# Patient Record
Sex: Female | Born: 1955 | Race: Asian | Hispanic: No | Marital: Married | Smoking: Never smoker
Health system: Southern US, Community
[De-identification: ages and names within clinical notes are randomized; demographics above are authoritative.]

## PROBLEM LIST (undated history)

## (undated) DIAGNOSIS — I10 Essential (primary) hypertension: Secondary | ICD-10-CM

## (undated) DIAGNOSIS — N189 Chronic kidney disease, unspecified: Secondary | ICD-10-CM

## (undated) DIAGNOSIS — E1165 Type 2 diabetes mellitus with hyperglycemia: Secondary | ICD-10-CM

## (undated) DIAGNOSIS — F0390 Unspecified dementia without behavioral disturbance: Secondary | ICD-10-CM

---
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
## 2013-08-18 DIAGNOSIS — I10 Essential (primary) hypertension: Secondary | ICD-10-CM

## 2013-08-18 DIAGNOSIS — IMO0002 Reserved for concepts with insufficient information to code with codable children: Secondary | ICD-10-CM

## 2013-08-18 DIAGNOSIS — E1165 Type 2 diabetes mellitus with hyperglycemia: Secondary | ICD-10-CM | POA: Insufficient documentation

## 2013-08-18 DIAGNOSIS — E119 Type 2 diabetes mellitus without complications: Secondary | ICD-10-CM | POA: Insufficient documentation

## 2013-08-18 HISTORY — DX: Type 2 diabetes mellitus with hyperglycemia: E11.65

## 2013-08-18 HISTORY — DX: Reserved for concepts with insufficient information to code with codable children: IMO0002

## 2013-08-18 HISTORY — DX: Essential (primary) hypertension: I10

## 2018-10-19 ENCOUNTER — Telehealth: Payer: Self-pay

## 2018-10-19 NOTE — Telephone Encounter (Signed)
Patient will need to schedule NP appointment

## 2019-03-28 ENCOUNTER — Other Ambulatory Visit: Payer: Self-pay

## 2019-03-28 ENCOUNTER — Ambulatory Visit: Payer: Self-pay | Admitting: Internal Medicine

## 2019-03-28 ENCOUNTER — Encounter: Payer: Self-pay | Admitting: Internal Medicine

## 2019-03-28 VITALS — BP 160/90 | HR 74 | Resp 12 | Ht 61.0 in | Wt 150.0 lb

## 2019-03-28 DIAGNOSIS — G309 Alzheimer's disease, unspecified: Secondary | ICD-10-CM

## 2019-03-28 DIAGNOSIS — I10 Essential (primary) hypertension: Secondary | ICD-10-CM

## 2019-03-28 DIAGNOSIS — F4312 Post-traumatic stress disorder, chronic: Secondary | ICD-10-CM

## 2019-03-28 DIAGNOSIS — F0280 Dementia in other diseases classified elsewhere without behavioral disturbance: Secondary | ICD-10-CM

## 2019-03-28 DIAGNOSIS — E1165 Type 2 diabetes mellitus with hyperglycemia: Secondary | ICD-10-CM

## 2019-03-28 DIAGNOSIS — F0390 Unspecified dementia without behavioral disturbance: Secondary | ICD-10-CM

## 2019-03-28 HISTORY — DX: Unspecified dementia, unspecified severity, without behavioral disturbance, psychotic disturbance, mood disturbance, and anxiety: F03.90

## 2019-03-28 MED ORDER — MELOXICAM 7.5 MG PO TABS: 7.5000 mg | ORAL_TABLET | Freq: Every day | ORAL | 6 refills | Status: DC

## 2019-03-28 MED ORDER — AMLODIPINE BESYLATE 5 MG PO TABS: 5.0000 mg | ORAL_TABLET | Freq: Every day | ORAL | 11 refills | Status: DC

## 2019-03-28 MED ORDER — METFORMIN HCL 1000 MG PO TABS: 1000.0000 mg | ORAL_TABLET | Freq: Two times a day (BID) | ORAL | 11 refills | Status: DC

## 2019-03-28 MED ORDER — GLIMEPIRIDE 1 MG PO TABS: 1.0000 mg | ORAL_TABLET | Freq: Every day | ORAL | 11 refills | Status: DC

## 2019-03-28 MED ORDER — METOPROLOL TARTRATE 100 MG PO TABS: 100.0000 mg | ORAL_TABLET | Freq: Two times a day (BID) | ORAL | 11 refills | Status: DC

## 2019-03-28 NOTE — Progress Notes (Signed)
Subjective:    Patient ID: Jennifer Molina, female   DOB: 1955-10-14, 63 y.o.   MRN: [id number]   HPI   Here to establish Her daughter, Tryphena Perkovich, interprets.  Speaks Rhade, [place]. Here also for evaluation of cognitive abilities for [phone].  Previously seen at [facility] by Raelyn Number , PA.  Very expensive as has to pay out of pocket as no medical insurance.  Current Health Issues:  1.  Memory Issues:  History of trauma in past during the [place] War.  Her two brothers stepped on a landmine, which exploded resulting in mortal injuries.  She observed their injuries when he was brought to the hospital.  She was 63 yo at the time.  Her brothers were 71 yo and 63 yo at the time of injuries and deaths.   She admits to bad memories of seeing her brothers and their injuries.  She develops physical symptoms of neck and terrible headache pain.   This keeps her from doing her ADLs due to the pain.   Generally suffers with this 3-4 times weekly.   Daughter notes these episodes, where her mother gets very quiet, appears sad and needs to go lie down.  Her mother describes getting sweaty with her heart beating fast with these episodes. She often isolates herself, is sad and hypervigilant.  Can be jumpy/startles easily.   She tries to enjoy her grandchildren, but her daughter states she even has difficulty being happy playing with them. The loss of her brothers also led to significant pressure on her as the eldest child in the family and no female leader in the family. Married to [place] soldier at age 65 yo.  Her husband was jailed for 5 years when they were in [place].   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  She also was in charge of caring for her 3 younger sisters at the time as well.  She essentially functioned as the head of the family as her parents were  aging at the time and could not work to bring in much income.   Came to the [place] with her husband and children on Dec.  17th, 2009. Her life remained hard when her husband was released from jail.  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   [place] a better life.    2.  Memory loss:  Daughter states she has had difficulties remembering simple chores, such as watering outdoor plants, but this has become worse in past 5 years per daughter.   Difficulty learning to use modern conveniences in the [place]   Turning on and off appliances and lights was difficult for her to learn when arrived in 2009. She went to ESOL classes from 2009 through 2013 and was unable to retain little of the information. Over the past 5 years, daughter notes she forgets what she was planning to purchase at the store.   She gets lost when out of the home.  Daughter states this happens frequently.  She has been on her way to visit a neighbor and gets lost and cannot find her way to the neighbor's home or her own.  She also cannot remember any phone numbers to get in touch with family  when she is lost. She leaves the stove on frequently and someone has to come behind her to turn it off.   Daughter states she has to remind her to take showers, to eat.  She can perform these tasks if reminded to do so. She has gone without eating at times with these reminders. Her short term memory is poor.  Daughter states she has to tell her the same information repeatedly over short time period as she does not retain.   No definite focal neurologic deficit.    The family has had to limit what she does due to her loss of memory.   3.  DM:  Diagnosed March 2015.  Went without treatment much of the time due to expense of doctor's visits and medication.  She also did not have and understanding of needing medication chronically--took for 2 months and then stopped  taking as she thought she was okay. Taking Metformin 1000 mg twice daily from March 2020 with establishing with [facility]  and Glimepiride 1 mg starting about 3 weeks ago.  Her daughter states she has to remind her with each dose to take her medications as she forgets.  Having a hard time paying for meds--costs about $500 per month.    Poor vision, but no eye exam done ever. No numbness of hands or feet. Not clear if high cholesterol. Not clear if any renal compromise.   4.  Hypertension:  Diagnosed also 2015 with same problem with medications as in DM.  Remains uncontrolled.  She is reportedly takes daily with her daughter reminding her, but did not take this morning.  Takes Lopressor twice daily and Amlodipine 5 mg added 3 weeks ago.       Current Meds  Medication Sig  . amLODipine (NORVASC) 5 MG tablet Take 5 mg by mouth daily.   . ASPIRIN LOW DOSE 81 MG EC tablet Take 81 mg by mouth daily.   Marland Kitchen glimepiride (AMARYL) 1 MG tablet Take 1 mg by mouth daily with breakfast.   . meloxicam (MOBIC) 7.5 MG tablet Take 7.5 mg by mouth daily.   . metFORMIN (GLUCOPHAGE) 1000 MG tablet Take 1,000 mg by mouth 2 (two) times daily with a meal.   . metoprolol tartrate (LOPRESSOR) 100 MG tablet Take 100 mg by mouth 2 (two) times daily.    No Known Allergies   Past Medical History:  Diagnosis Date  . DM (diabetes mellitus), type 2, uncontrolled ([place]) 2015   Has poor vision.  Family not aware of A1C or what sugars run.  Believe not well controlled.  Not able to monitor due to cost of monitoring equipment  . Hypertension 2015    History reviewed. No pertinent surgical history.  Social History   Socioeconomic History  . Marital status: Married    Spouse name: [name]  . Number of children: 7  . Years of education: 3  . Highest education level: 3rd grade  Occupational History  . Occupation: Retired from farming in [place]  Social Needs  . Financial resource strain: Not on file  . Food  insecurity    Worry: Not on file    Inability: Not on file  . Transportation needs    Medical: Not on file    Non-medical: Not on file  Tobacco Use  . Smoking status: Never Smoker  . Smokeless tobacco: Never Used  Substance and Sexual Activity  . Alcohol use: Never    Frequency: Never  . Drug use: Never  .  Sexual activity: Not on file  Lifestyle  . Physical activity    Days per week: Not on file    Minutes per session: Not on file  . Stress: Not on file  Relationships  . Social Herbalist on phone: Not on file    Gets together: Not on file    Attends religious service: Not on file    Active member of club or organization: Not on file    Attends meetings of clubs or organizations: Not on file    Relationship status: Not on file  . Intimate partner violence    Fear of current or ex partner: Not on file    Emotionally abused: Not on file    Physically abused: Not on file    Forced sexual activity: Not on file  Other Topics Concern  . Not on file  Social History Narrative   [place]   Lives at home with husband   Her 2 daughters, 2 sons and their families   All together, 63 people in the home        Review of Systems    Objective:   BP (!) 160/90 (BP Location: Left Arm, Patient Position: Sitting, Cuff Size: Normal)   Pulse 74   Resp 12   Ht 5\' 1"  (1.549 m)   Wt 150 lb (68 kg)   LMP  (LMP Unknown)   BMI 28.34 kg/m   Physical Exam   NAD, though holds head and neck toward end of MoCA when appears stressed with answering questions and cannot answer. HEENT:  PERRL, EOMI, discs sharp, TMs pearly gray, throat without injection. Neck:  Supple, No adenopathy, no thyromegaly Chest:  CTA CV:  RRR with normal S1 and S2, No S3, S4 or murmur.  No carotid bruits.  Carotid, Radial, Femoral, DP and PT pulses normal and equal Abd:  S, NT, No HSM or mass, + BS LE:  No edema Neuro:  Alert.  Oriented to person, day of week, month, year, but not day of  month.  Not oriented to place CN  II-XII grossly intact.  Motor 5/5 throughout.  DTRs 2+/4 throughout.  Gait normal. During paperwork, noted that daughter had to talk her through signing and dating her paperwork today.  Montreal Cognitive Assessment  03/28/2019  Visuospatial/ Executive (0/5) 1  Naming (0/3) 1  Attention: Read list of digits (0/2) 0  Attention: Read list of letters (0/1) 1  Attention: Serial 7 subtraction starting at 100 (0/3) 0  Language: Repeat phrase (0/2) 1  Language : Fluency (0/1) 0  Abstraction (0/2) 0  Delayed Recall (0/5) 0  Orientation (0/6) 4  Total 8  Adjusted Score (based on education) 9     Assessment & Plan  1.  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 Her long term memory appears to be fairly intact, however, recalling incidents from her young adulthood in [place].   Discuss medication at follow up. Form [id number]   2.  PTSD with history of exposure to war, witnessing the aftermath of her brothers' violent deaths, extreme stress as a young women with her husband jailed and her new role as head of her extended family household including breadwinner during a time of war. Continues to suffer symptoms related  to these memories. Scored 38/80 on PTSD Checklist, supporting this diagnosis as well. Referral to Adelene Amas, LCSWA for counseling.  3. DM:  A1C, FLP, urine microalbumin today.   Glimepiride, Metformin refilled at [facility] where less expensive.  To apply for [facility] orange card so can obtain monitoring equipment more affordably. Will need eye check once has orange card Send for records from [facility]-suspect need Pneumococcal 23 v immunization.  4.  Hypertension:  Did not take meds this  morning.  Not clear if current regimen adequately controls bp. Refilled Amlodipine and Metoprolol. Urine microalbumin/crea to see if should be taking ACE I.   Recheck in 6 weeks.

## 2019-03-30 LAB — TSH: TSH: 3.08 u[IU]/mL (ref 0.450–4.500)

## 2019-03-30 LAB — COMPREHENSIVE METABOLIC PANEL
ALT: 12 IU/L (ref 0–32)
AST: 15 IU/L (ref 0–40)
Albumin/Globulin Ratio: 1.1 — ABNORMAL LOW (ref 1.2–2.2)
Albumin: 4.6 g/dL (ref 3.8–4.8)
Alkaline Phosphatase: 54 IU/L (ref 39–117)
BUN/Creatinine Ratio: 16 (ref 12–28)
BUN: 14 mg/dL (ref 8–27)
Bilirubin Total: <0.2 mg/dL (ref 0.0–1.2)
CO2: 20 mmol/L (ref 20–29)
Calcium: 9.7 mg/dL (ref 8.7–10.3)
Chloride: 101 mmol/L (ref 96–106)
Creatinine, Ser: 0.9 mg/dL (ref 0.57–1.00)
GFR calc Af Amer: 78 mL/min/{1.73_m2} (ref >59)
GFR calc non Af Amer: 68 mL/min/{1.73_m2} (ref >59)
Globulin, Total: 4.1 g/dL (ref 1.5–4.5)
Glucose: 203 mg/dL — ABNORMAL HIGH (ref 65–99)
Potassium: 4.7 mmol/L (ref 3.5–5.2)
Sodium: 140 mmol/L (ref 134–144)
Total Protein: 8.7 g/dL — ABNORMAL HIGH (ref 6.0–8.5)

## 2019-03-30 LAB — LIPID PANEL W/O CHOL/HDL RATIO
Cholesterol, Total: 222 mg/dL — ABNORMAL HIGH (ref 100–199)
HDL: 40 mg/dL (ref >39)
LDL Calculated: 104 mg/dL — ABNORMAL HIGH (ref 0–99)
Triglycerides: 389 mg/dL — ABNORMAL HIGH (ref 0–149)
VLDL Cholesterol Cal: 78 mg/dL — ABNORMAL HIGH (ref 5–40)

## 2019-03-30 LAB — CBC WITH DIFFERENTIAL/PLATELET
Basophils Absolute: 0.1 10*3/uL (ref 0.0–0.2)
Basos: 1 %
EOS (ABSOLUTE): 0.1 10*3/uL (ref 0.0–0.4)
Eos: 2 %
Hematocrit: 37.4 % (ref 34.0–46.6)
Hemoglobin: 12.2 g/dL (ref 11.1–15.9)
Immature Grans (Abs): 0 10*3/uL (ref 0.0–0.1)
Immature Granulocytes: 1 %
Lymphocytes Absolute: 2.5 10*3/uL (ref 0.7–3.1)
Lymphs: 31 %
MCH: 27 pg (ref 26.6–33.0)
MCHC: 32.6 g/dL (ref 31.5–35.7)
MCV: 83 fL (ref 79–97)
Monocytes Absolute: 0.5 10*3/uL (ref 0.1–0.9)
Monocytes: 6 %
Neutrophils Absolute: 5.1 10*3/uL (ref 1.4–7.0)
Neutrophils: 59 %
Platelets: 295 10*3/uL (ref 150–450)
RBC: 4.52 x10E6/uL (ref 3.77–5.28)
RDW: 14.4 % (ref 11.7–15.4)
WBC: 8.3 10*3/uL (ref 3.4–10.8)

## 2019-03-30 LAB — HGB A1C W/O EAG: Hgb A1c MFr Bld: 8.3 % — ABNORMAL HIGH (ref 4.8–5.6)

## 2019-03-30 LAB — MICROALBUMIN / CREATININE URINE RATIO
Creatinine, Urine: 37.6 mg/dL
Microalb/Creat Ratio: 65 mg/g creat — ABNORMAL HIGH (ref 0–29)
Microalbumin, Urine: 24.6 ug/mL

## 2019-05-10 ENCOUNTER — Ambulatory Visit: Payer: Self-pay | Admitting: Internal Medicine

## 2019-07-21 ENCOUNTER — Other Ambulatory Visit: Payer: Self-pay

## 2019-07-21 ENCOUNTER — Emergency Department (HOSPITAL_COMMUNITY): Payer: Medicaid Other

## 2019-07-21 ENCOUNTER — Encounter (HOSPITAL_COMMUNITY): Payer: Self-pay | Admitting: Emergency Medicine

## 2019-07-21 ENCOUNTER — Inpatient Hospital Stay (HOSPITAL_COMMUNITY)
Admission: EM | Admit: 2019-07-21 | Discharge: 2019-07-29 | DRG: 871 | Disposition: A | Discharge status: Home / Self Care | Payer: Medicaid Other | Attending: Internal Medicine | Admitting: Internal Medicine

## 2019-07-21 DIAGNOSIS — R197 Diarrhea, unspecified: Secondary | ICD-10-CM | POA: Diagnosis present

## 2019-07-21 DIAGNOSIS — U071 COVID-19: Secondary | ICD-10-CM

## 2019-07-21 DIAGNOSIS — H547 Unspecified visual loss: Secondary | ICD-10-CM | POA: Diagnosis present

## 2019-07-21 DIAGNOSIS — J1289 Other viral pneumonia: Secondary | ICD-10-CM | POA: Diagnosis present

## 2019-07-21 DIAGNOSIS — I10 Essential (primary) hypertension: Secondary | ICD-10-CM | POA: Diagnosis present

## 2019-07-21 DIAGNOSIS — Z7984 Long term (current) use of oral hypoglycemic drugs: Secondary | ICD-10-CM

## 2019-07-21 DIAGNOSIS — D649 Anemia, unspecified: Secondary | ICD-10-CM | POA: Diagnosis present

## 2019-07-21 DIAGNOSIS — F0390 Unspecified dementia without behavioral disturbance: Secondary | ICD-10-CM | POA: Diagnosis present

## 2019-07-21 DIAGNOSIS — IMO0002 Reserved for concepts with insufficient information to code with codable children: Secondary | ICD-10-CM | POA: Diagnosis present

## 2019-07-21 DIAGNOSIS — Z79899 Other long term (current) drug therapy: Secondary | ICD-10-CM

## 2019-07-21 DIAGNOSIS — J9601 Acute respiratory failure with hypoxia: Secondary | ICD-10-CM | POA: Diagnosis present

## 2019-07-21 DIAGNOSIS — E1165 Type 2 diabetes mellitus with hyperglycemia: Secondary | ICD-10-CM | POA: Diagnosis present

## 2019-07-21 DIAGNOSIS — Z7982 Long term (current) use of aspirin: Secondary | ICD-10-CM

## 2019-07-21 DIAGNOSIS — E871 Hypo-osmolality and hyponatremia: Secondary | ICD-10-CM | POA: Diagnosis present

## 2019-07-21 DIAGNOSIS — A4189 Other specified sepsis: Principal | ICD-10-CM | POA: Diagnosis present

## 2019-07-21 DIAGNOSIS — J1282 Pneumonia due to coronavirus disease 2019: Secondary | ICD-10-CM

## 2019-07-21 DIAGNOSIS — E860 Dehydration: Secondary | ICD-10-CM | POA: Diagnosis present

## 2019-07-21 LAB — CBC
HCT: 32.2 % — ABNORMAL LOW (ref 36.0–46.0)
Hemoglobin: 10.6 g/dL — ABNORMAL LOW (ref 12.0–15.0)
MCH: 25.9 pg — ABNORMAL LOW (ref 26.0–34.0)
MCHC: 32.9 g/dL (ref 30.0–36.0)
MCV: 78.7 fL — ABNORMAL LOW (ref 80.0–100.0)
Platelets: 197 10*3/uL (ref 150–400)
RBC: 4.09 MIL/uL (ref 3.87–5.11)
RDW: 13 % (ref 11.5–15.5)
WBC: 6 10*3/uL (ref 4.0–10.5)
nRBC: 0 % (ref 0.0–0.2)

## 2019-07-21 LAB — COMPREHENSIVE METABOLIC PANEL
ALT: 38 U/L (ref 0–44)
AST: 59 U/L — ABNORMAL HIGH (ref 15–41)
Albumin: 2.9 g/dL — ABNORMAL LOW (ref 3.5–5.0)
Alkaline Phosphatase: 36 U/L — ABNORMAL LOW (ref 38–126)
Anion gap: 10 (ref 5–15)
BUN: 11 mg/dL (ref 8–23)
CO2: 22 mmol/L (ref 22–32)
Calcium: 8.5 mg/dL — ABNORMAL LOW (ref 8.9–10.3)
Chloride: 98 mmol/L (ref 98–111)
Creatinine, Ser: 0.94 mg/dL (ref 0.44–1.00)
GFR calc Af Amer: >60 mL/min (ref >60)
GFR calc non Af Amer: >60 mL/min (ref >60)
Glucose, Bld: 311 mg/dL — ABNORMAL HIGH (ref 70–99)
Potassium: 3.6 mmol/L (ref 3.5–5.1)
Sodium: 130 mmol/L — ABNORMAL LOW (ref 135–145)
Total Bilirubin: 0.5 mg/dL (ref 0.3–1.2)
Total Protein: 7.5 g/dL (ref 6.5–8.1)

## 2019-07-21 LAB — LACTIC ACID, PLASMA: Lactic Acid, Venous: 1.9 mmol/L (ref 0.5–1.9)

## 2019-07-21 LAB — LIPASE, BLOOD: Lipase: 34 U/L (ref 11–51)

## 2019-07-21 MED ORDER — SODIUM CHLORIDE 0.9 % IV BOLUS
1000.0000 mL | Freq: Once | INTRAVENOUS | Status: AC
  Administered 2019-07-22: 1000 mL via INTRAVENOUS

## 2019-07-21 MED ORDER — ACETAMINOPHEN 325 MG PO TABS
650.0000 mg | ORAL_TABLET | Freq: Once | ORAL | Status: AC
  Administered 2019-07-21: 19:00:00 650 mg via ORAL
  Filled 2019-07-21: qty 2

## 2019-07-21 MED ORDER — SODIUM CHLORIDE 0.9% FLUSH
3.0000 mL | Freq: Once | INTRAVENOUS | Status: AC
  Administered 2019-07-22: 3 mL via INTRAVENOUS

## 2019-07-21 NOTE — ED Triage Notes (Signed)
Daughter/translator stated, shes been sick since Saturday with N/V/D and a fever.

## 2019-07-21 NOTE — ED Provider Notes (Signed)
[facility] EMERGENCY DEPARTMENT Provider Note   CSN: [id number] Arrival date & time: 07/21/19  [place]     History   Chief Complaint Chief Complaint  Patient presents with  . Nausea  . Emesis  . Diarrhea  . Fever    HPI Jennifer Molina is a 63 y.o. female.     HPI  Attempted status interpreter but difficulty 2/2 to dialect. Patient's daughter at bedside provided interpretation for the encounter:  Pt is a 63 year old female with PMH of DMII, HTN, dementia who presents to the ED with fever, N/V/D. Pt has had fever for 5 days. Has been taking tylenol and ibuprofen (400mg  total daily). Yesterday, she began having nausea, NBNB  Emesis and diarrhea. She denies abdominal pain. No dysuria. Denies headache. Does endorse some chest discomfort intermittently. Many members of her family have had similar symptoms.   Past Medical History:  Diagnosis Date  . Dementia ([place]) 03/28/2019  . DM (diabetes mellitus), type 2, uncontrolled ([place]) 2015   Has poor vision.  Family not aware of A1C or what sugars run.  Believe not well controlled.  Not able to monitor due to cost of monitoring equipment  . Hypertension 2015    Patient Active Problem List   Diagnosis Date Noted  . SIRS (systemic inflammatory response syndrome) ([place]) 07/22/2019  . Pneumonia due to COVID-19 virus 07/22/2019  . Dementia ([place]) 03/28/2019  . Chronic post-traumatic stress disorder (PTSD) 03/28/2019  . DM (diabetes mellitus), type 2, uncontrolled ([place]) 2015  . Hypertension 2015    History reviewed. No pertinent surgical history.   OB History   No obstetric history on file.      Home Medications    Prior to Admission medications   Medication Sig Start Date End Date Taking? Authorizing Provider  acetaminophen (TYLENOL) 500 MG tablet Take 1,000 mg by mouth every 6 (six) hours as needed for mild pain or fever.   Yes [provider]  amLODipine (NORVASC) 5 MG tablet Take 1 tablet (5 mg total) by  mouth daily. 03/28/19   Mack Hook, MD  ASPIRIN LOW DOSE 81 MG EC tablet Take 81 mg by mouth daily.  03/17/19   [provider]  glimepiride (AMARYL) 1 MG tablet Take 1 tablet (1 mg total) by mouth daily with breakfast. 03/28/19   Mack Hook, MD  meloxicam (MOBIC) 7.5 MG tablet Take 1 tablet (7.5 mg total) by mouth daily. 03/28/19   Mack Hook, MD  metFORMIN (GLUCOPHAGE) 1000 MG tablet Take 1 tablet (1,000 mg total) by mouth 2 (two) times daily with a meal. 03/28/19   Mack Hook, MD  metoprolol tartrate (LOPRESSOR) 100 MG tablet Take 1 tablet (100 mg total) by mouth 2 (two) times daily. 03/28/19   Mack Hook, MD    Family History Family History  Problem Relation Age of Onset  . Diabetes Mellitus II Neg Hx     Social History Social History   Tobacco Use  . Smoking status: Never Smoker  . Smokeless tobacco: Never Used  Substance Use Topics  . Alcohol use: Never    Frequency: Never  . Drug use: Never     Allergies   Patient has no known allergies.   Review of Systems Review of Systems  Constitutional: Positive for appetite change, chills and fever.  HENT: Positive for sore throat. Negative for congestion.   Respiratory: Positive for cough, chest tightness and shortness of breath.   Cardiovascular: Negative for leg swelling.  Gastrointestinal: Positive  for diarrhea, nausea and vomiting. Negative for abdominal pain and blood in stool.  Genitourinary: Negative for dysuria.  Musculoskeletal: Negative for back pain.  Skin: Negative for rash.  Neurological: Negative for headaches.     Physical Exam Updated Vital Signs BP (!) 152/81 (BP Location: Right Arm)   Pulse 85   Temp 99.8 F (37.7 C) (Oral)   Resp (!) 32   Ht 5\' 1"  (1.549 m)   Wt 68 kg   LMP  (LMP Unknown)   SpO2 91%   BMI 28.33 kg/m   Physical Exam Vitals signs and nursing note reviewed.  Constitutional:      General: She is not in acute distress.     Appearance: She is well-developed.  HENT:     Head: Normocephalic and atraumatic.     Mouth/Throat:     Mouth: Mucous membranes are dry.  Eyes:     Extraocular Movements: Extraocular movements intact.     Conjunctiva/sclera: Conjunctivae normal.     Pupils: Pupils are equal, round, and reactive to light.  Neck:     Musculoskeletal: Normal range of motion and neck supple. No neck rigidity.  Cardiovascular:     Rate and Rhythm: Regular rhythm. Tachycardia present.     Heart sounds: No murmur.  Pulmonary:     Effort: Pulmonary effort is normal. No respiratory distress.     Breath sounds: Normal breath sounds.  Abdominal:     General: There is no distension.     Palpations: Abdomen is soft. There is no mass.     Tenderness: There is no abdominal tenderness.  Musculoskeletal: Normal range of motion.  Skin:    General: Skin is warm and dry.  Neurological:     General: No focal deficit present.     Mental Status: She is alert. Mental status is at baseline.     Sensory: No sensory deficit.     Motor: No weakness.  Psychiatric:        Mood and Affect: Mood normal.        Behavior: Behavior normal.      ED Treatments / Results  Labs (all labs ordered are listed, but only abnormal results are displayed) Labs Reviewed  COMPREHENSIVE METABOLIC PANEL - Abnormal; Notable for the following components:      Result Value   Sodium 130 (*)    Glucose, Bld 311 (*)    Calcium 8.5 (*)    Albumin 2.9 (*)    AST 59 (*)    Alkaline Phosphatase 36 (*)    All other components within normal limits  CBC - Abnormal; Notable for the following components:   Hemoglobin 10.6 (*)    HCT 32.2 (*)    MCV 78.7 (*)    MCH 25.9 (*)    All other components within normal limits  COMPREHENSIVE METABOLIC PANEL - Abnormal; Notable for the following components:   Sodium 129 (*)    CO2 20 (*)    Glucose, Bld 296 (*)    Calcium 8.1 (*)    Albumin 2.7 (*)    AST 52 (*)    Alkaline Phosphatase 37 (*)     All other components within normal limits  C-REACTIVE PROTEIN - Abnormal; Notable for the following components:   CRP 6.7 (*)    All other components within normal limits  CBC WITH DIFFERENTIAL/PLATELET - Abnormal; Notable for the following components:   Hemoglobin 10.1 (*)    HCT 31.0 (*)    MCV 78.1 (*)  MCH 25.4 (*)    All other components within normal limits  FERRITIN - Abnormal; Notable for the following components:   Ferritin 1,690 (*)    All other components within normal limits  GLUCOSE, CAPILLARY - Abnormal; Notable for the following components:   Glucose-Capillary 263 (*)    All other components within normal limits  POC SARS CORONAVIRUS 2 AG -  ED - Abnormal; Notable for the following components:   SARS Coronavirus 2 Ag POSITIVE (*)    All other components within normal limits  CBG MONITORING, ED - Abnormal; Notable for the following components:   Glucose-Capillary 395 (*)    All other components within normal limits  LIPASE, BLOOD  LACTIC ACID, PLASMA  HIV ANTIBODY (ROUTINE TESTING W REFLEX)  URINALYSIS, ROUTINE W REFLEX MICROSCOPIC  CBC  LACTATE DEHYDROGENASE  FIBRINOGEN  FERRITIN  PROCALCITONIN  C-REACTIVE PROTEIN  SEDIMENTATION RATE  D-DIMER, QUANTITATIVE (NOT AT [facility])  ABO/RH  TROPONIN I (HIGH SENSITIVITY)  TROPONIN I (HIGH SENSITIVITY)    EKG EKG Interpretation  Date/Time:  Thursday July 21 2019 18:45:07 EST Ventricular Rate:  119 PR Interval:  156 QRS Duration: 66 QT Interval:  322 QTC Calculation: 452 R Axis:   81 Text Interpretation: Sinus tachycardia Otherwise normal ECG No previous ECGs available Confirmed by Ripley Fraise [id number]) on 07/22/2019 12:03:46 AM   Radiology Dg Chest Portable 1 View  Result Date: 07/21/2019 CLINICAL DATA:  63 year old female with fever and cough for several days. COVID-19 test pending. EXAM: PORTABLE CHEST 1 VIEW COMPARISON:  None. FINDINGS: Portable AP semi upright view at 2326 hours. Multifocal patchy  and confluent mostly peripheral bilateral pulmonary opacity sparing the lung apices. Superimposed low lung volumes. Mediastinal contours remain normal. No pneumothorax, pulmonary edema or pleural effusion. Visualized tracheal air column is within normal limits. Negative visible bowel gas pattern. No acute osseous abnormality identified. IMPRESSION: Patchy and confluent bilateral peripheral mid and lower lung opacity, most compatible with bilateral pneumonia in this clinical setting. This is a pattern frequently seen with COVID-19. Electronically Signed   By: Genevie Ann M.D.   On: 07/21/2019 23:36    Procedures Procedures (including critical care time)  Medications Ordered in ED Medications  aspirin EC tablet 81 mg (81 mg Oral Given 07/22/19 0955)  amLODipine (NORVASC) tablet 5 mg (5 mg Oral Given 07/22/19 0955)  metoprolol tartrate (LOPRESSOR) tablet 100 mg (100 mg Oral Given 07/22/19 0955)  acetaminophen (TYLENOL) tablet 650 mg (has no administration in time range)    Or  acetaminophen (TYLENOL) suppository 650 mg (has no administration in time range)  ondansetron (ZOFRAN) tablet 4 mg (has no administration in time range)    Or  ondansetron (ZOFRAN) injection 4 mg (has no administration in time range)  enoxaparin (LOVENOX) injection 40 mg (40 mg Subcutaneous Given 07/22/19 0955)  dexamethasone (DECADRON) injection 6 mg (has no administration in time range)  remdesivir 200 mg in sodium chloride 0.9% 250 mL IVPB (0 mg Intravenous Stopped 07/22/19 0657)    Followed by  remdesivir 100 mg in sodium chloride 0.9 % 100 mL IVPB (has no administration in time range)  insulin aspart (novoLOG) injection 0-15 Units (8 Units Subcutaneous Given 07/22/19 1246)  insulin detemir (LEVEMIR) injection 10 Units (has no administration in time range)  sodium chloride flush (NS) 0.9 % injection 3 mL (3 mLs Intravenous Given 07/22/19 0007)  acetaminophen (TYLENOL) tablet 650 mg (650 mg Oral Given 07/21/19 1911)  sodium  chloride 0.9 % bolus 1,000 mL (0 mLs  Intravenous Stopped 07/22/19 0135)  dexamethasone (DECADRON) injection 6 mg (6 mg Intravenous Given 07/22/19 0201)     Initial Impression / Assessment and Plan / ED Course  I have reviewed the triage vital signs and the nursing notes.  Pertinent labs & imaging results that were available during my care of the patient were reviewed by me and considered in my medical decision making (see chart for details).     Jennifer Molina was evaluated in Emergency Department on 07/22/2019 for the symptoms described in the history of present illness. She was evaluated in the context of the global COVID-19 pandemic, which necessitated consideration that the patient might be at risk for infection with the SARS-CoV-2 virus that causes COVID-19. Institutional protocols and algorithms that pertain to the evaluation of patients at risk for COVID-19 are in a state of rapid change based on information released by regulatory bodies including the CDC and federal and state organizations. These policies and algorithms were followed during the patient's care in the ED.   On arrival, pt is febrile, mildly tachycardia. At bedside, pt placed on RA and had desaturation to 87%, was placed on 2L [place] with improvement.   Considered: Viral URI/gastroenteritis, Covid 19, PNA, influenza; Abdomen is soft and non tender, no focal pain to suggest appendicitis, SBO, mesenteric ischemia (LA non elevated as well). Lipase non elevated  Hgb 10.6 from 12.2 3 months ago. Denies blood in stool or emesis.   EKG: Sinus tachycardia CXR: Bilateral PNA, likely viral   Covid 19 +  Overall, concern for viral syndrome in setting of covid 19 infection and PNA. Given hypoxia, plan for patient to be admitted to medicine. Care of patient assumed by Dr. Christy Gentles at handoff.   Final Clinical Impressions(s) / ED Diagnoses   Final diagnoses:  Pneumonia due to COVID-19 virus    ED Discharge Orders    None        Burns Spain, MD 07/22/19 1313    Ripley Fraise, MD 07/22/19 2306

## 2019-07-21 NOTE — ED Triage Notes (Signed)
Last dose of Tylenol was 1000 this morning.

## 2019-07-22 ENCOUNTER — Encounter (HOSPITAL_COMMUNITY): Payer: Self-pay | Admitting: Internal Medicine

## 2019-07-22 ENCOUNTER — Other Ambulatory Visit: Payer: Self-pay

## 2019-07-22 DIAGNOSIS — E1165 Type 2 diabetes mellitus with hyperglycemia: Secondary | ICD-10-CM

## 2019-07-22 DIAGNOSIS — Z7982 Long term (current) use of aspirin: Secondary | ICD-10-CM | POA: Diagnosis not present

## 2019-07-22 DIAGNOSIS — R197 Diarrhea, unspecified: Secondary | ICD-10-CM | POA: Diagnosis present

## 2019-07-22 DIAGNOSIS — U071 COVID-19: Secondary | ICD-10-CM | POA: Diagnosis present

## 2019-07-22 DIAGNOSIS — H547 Unspecified visual loss: Secondary | ICD-10-CM | POA: Diagnosis present

## 2019-07-22 DIAGNOSIS — I10 Essential (primary) hypertension: Secondary | ICD-10-CM | POA: Diagnosis present

## 2019-07-22 DIAGNOSIS — F0390 Unspecified dementia without behavioral disturbance: Secondary | ICD-10-CM | POA: Diagnosis present

## 2019-07-22 DIAGNOSIS — Z7984 Long term (current) use of oral hypoglycemic drugs: Secondary | ICD-10-CM | POA: Diagnosis not present

## 2019-07-22 DIAGNOSIS — Z79899 Other long term (current) drug therapy: Secondary | ICD-10-CM | POA: Diagnosis not present

## 2019-07-22 DIAGNOSIS — D649 Anemia, unspecified: Secondary | ICD-10-CM | POA: Diagnosis present

## 2019-07-22 DIAGNOSIS — E860 Dehydration: Secondary | ICD-10-CM | POA: Diagnosis present

## 2019-07-22 DIAGNOSIS — J1289 Other viral pneumonia: Secondary | ICD-10-CM | POA: Diagnosis present

## 2019-07-22 DIAGNOSIS — A4189 Other specified sepsis: Secondary | ICD-10-CM | POA: Diagnosis present

## 2019-07-22 DIAGNOSIS — J9601 Acute respiratory failure with hypoxia: Secondary | ICD-10-CM | POA: Diagnosis present

## 2019-07-22 DIAGNOSIS — E871 Hypo-osmolality and hyponatremia: Secondary | ICD-10-CM | POA: Diagnosis present

## 2019-07-22 DIAGNOSIS — J1282 Pneumonia due to coronavirus disease 2019: Secondary | ICD-10-CM | POA: Diagnosis present

## 2019-07-22 LAB — CBC WITH DIFFERENTIAL/PLATELET
Abs Immature Granulocytes: 0.03 10*3/uL (ref 0.00–0.07)
Basophils Absolute: 0 10*3/uL (ref 0.0–0.1)
Basophils Relative: 0 %
Eosinophils Absolute: 0 10*3/uL (ref 0.0–0.5)
Eosinophils Relative: 0 %
HCT: 31 % — ABNORMAL LOW (ref 36.0–46.0)
Hemoglobin: 10.1 g/dL — ABNORMAL LOW (ref 12.0–15.0)
Immature Granulocytes: 1 %
Lymphocytes Relative: 19 %
Lymphs Abs: 1 10*3/uL (ref 0.7–4.0)
MCH: 25.4 pg — ABNORMAL LOW (ref 26.0–34.0)
MCHC: 32.6 g/dL (ref 30.0–36.0)
MCV: 78.1 fL — ABNORMAL LOW (ref 80.0–100.0)
Monocytes Absolute: 0.1 10*3/uL (ref 0.1–1.0)
Monocytes Relative: 2 %
Neutro Abs: 4 10*3/uL (ref 1.7–7.7)
Neutrophils Relative %: 78 %
Platelets: 214 10*3/uL (ref 150–400)
RBC: 3.97 MIL/uL (ref 3.87–5.11)
RDW: 12.9 % (ref 11.5–15.5)
WBC: 5.1 10*3/uL (ref 4.0–10.5)
nRBC: 0 % (ref 0.0–0.2)

## 2019-07-22 LAB — COMPREHENSIVE METABOLIC PANEL
ALT: 34 U/L (ref 0–44)
AST: 52 U/L — ABNORMAL HIGH (ref 15–41)
Albumin: 2.7 g/dL — ABNORMAL LOW (ref 3.5–5.0)
Alkaline Phosphatase: 37 U/L — ABNORMAL LOW (ref 38–126)
Anion gap: 10 (ref 5–15)
BUN: 11 mg/dL (ref 8–23)
CO2: 20 mmol/L — ABNORMAL LOW (ref 22–32)
Calcium: 8.1 mg/dL — ABNORMAL LOW (ref 8.9–10.3)
Chloride: 99 mmol/L (ref 98–111)
Creatinine, Ser: 0.81 mg/dL (ref 0.44–1.00)
GFR calc Af Amer: >60 mL/min (ref >60)
GFR calc non Af Amer: >60 mL/min (ref >60)
Glucose, Bld: 296 mg/dL — ABNORMAL HIGH (ref 70–99)
Potassium: 4.2 mmol/L (ref 3.5–5.1)
Sodium: 129 mmol/L — ABNORMAL LOW (ref 135–145)
Total Bilirubin: 0.6 mg/dL (ref 0.3–1.2)
Total Protein: 7.3 g/dL (ref 6.5–8.1)

## 2019-07-22 LAB — HEMOGLOBIN A1C
Hgb A1c MFr Bld: 8.9 % — ABNORMAL HIGH (ref 4.8–5.6)
Mean Plasma Glucose: 208.73 mg/dL

## 2019-07-22 LAB — GLUCOSE, CAPILLARY
Glucose-Capillary: 263 mg/dL — ABNORMAL HIGH (ref 70–99)
Glucose-Capillary: 125 mg/dL — ABNORMAL HIGH (ref 70–99)
Glucose-Capillary: 293 mg/dL — ABNORMAL HIGH (ref 70–99)

## 2019-07-22 LAB — TROPONIN I (HIGH SENSITIVITY)
Troponin I (High Sensitivity): 9 ng/L (ref <18)
Troponin I (High Sensitivity): 12 ng/L (ref <18)

## 2019-07-22 LAB — ABO/RH: ABO/RH(D): AB POS

## 2019-07-22 LAB — FERRITIN: Ferritin: 1690 ng/mL — ABNORMAL HIGH (ref 11–307)

## 2019-07-22 LAB — CBG MONITORING, ED: Glucose-Capillary: 395 mg/dL — ABNORMAL HIGH (ref 70–99)

## 2019-07-22 LAB — POC SARS CORONAVIRUS 2 AG -  ED: SARS Coronavirus 2 Ag: POSITIVE — AB

## 2019-07-22 LAB — C-REACTIVE PROTEIN: CRP: 6.7 mg/dL — ABNORMAL HIGH (ref <1.0)

## 2019-07-22 LAB — HIV ANTIBODY (ROUTINE TESTING W REFLEX): HIV Screen 4th Generation wRfx: NONREACTIVE

## 2019-07-22 MED ORDER — INSULIN ASPART 100 UNIT/ML ~~LOC~~ SOLN: 0.0000 [IU] | Freq: Every day | SUBCUTANEOUS | Status: DC

## 2019-07-22 MED ORDER — INSULIN ASPART 100 UNIT/ML ~~LOC~~ SOLN
0.0000 [IU] | Freq: Every day | SUBCUTANEOUS | Status: DC
  Administered 2019-07-22: 21:00:00 3 [IU] via SUBCUTANEOUS
  Administered 2019-07-23 – 2019-07-25 (×3): 5 [IU] via SUBCUTANEOUS
  Administered 2019-07-26 – 2019-07-27 (×2): 3 [IU] via SUBCUTANEOUS
  Administered 2019-07-28: 5 [IU] via SUBCUTANEOUS

## 2019-07-22 MED ORDER — ASPIRIN EC 81 MG PO TBEC
81.0000 mg | DELAYED_RELEASE_TABLET | Freq: Every day | ORAL | Status: DC
  Administered 2019-07-22 – 2019-07-29 (×8): 81 mg via ORAL
  Filled 2019-07-22 (×8): qty 1

## 2019-07-22 MED ORDER — ACETAMINOPHEN 325 MG PO TABS
650.0000 mg | ORAL_TABLET | Freq: Four times a day (QID) | ORAL | Status: DC | PRN
  Administered 2019-07-24: 650 mg via ORAL
  Filled 2019-07-22: qty 2

## 2019-07-22 MED ORDER — ONDANSETRON HCL 4 MG PO TABS: 4.0000 mg | ORAL_TABLET | Freq: Four times a day (QID) | ORAL | Status: DC | PRN

## 2019-07-22 MED ORDER — AMLODIPINE BESYLATE 5 MG PO TABS
5.0000 mg | ORAL_TABLET | Freq: Every day | ORAL | Status: DC
  Administered 2019-07-22 – 2019-07-29 (×8): 5 mg via ORAL
  Filled 2019-07-22 (×8): qty 1

## 2019-07-22 MED ORDER — DEXAMETHASONE SODIUM PHOSPHATE 10 MG/ML IJ SOLN
6.0000 mg | Freq: Once | INTRAMUSCULAR | Status: AC
  Administered 2019-07-22: 02:00:00 6 mg via INTRAVENOUS
  Filled 2019-07-22: qty 1

## 2019-07-22 MED ORDER — METOPROLOL TARTRATE 25 MG PO TABS
100.0000 mg | ORAL_TABLET | Freq: Two times a day (BID) | ORAL | Status: DC
  Administered 2019-07-22 – 2019-07-29 (×15): 100 mg via ORAL
  Filled 2019-07-22 (×15): qty 4

## 2019-07-22 MED ORDER — INSULIN ASPART 100 UNIT/ML ~~LOC~~ SOLN
0.0000 [IU] | SUBCUTANEOUS | Status: DC
  Administered 2019-07-22: 13:00:00 8 [IU] via SUBCUTANEOUS

## 2019-07-22 MED ORDER — INSULIN ASPART 100 UNIT/ML ~~LOC~~ SOLN
0.0000 [IU] | Freq: Three times a day (TID) | SUBCUTANEOUS | Status: DC
  Administered 2019-07-22: 1 [IU] via SUBCUTANEOUS
  Administered 2019-07-23: 9 [IU] via SUBCUTANEOUS
  Administered 2019-07-23: 2 [IU] via SUBCUTANEOUS
  Administered 2019-07-23: 9 [IU] via SUBCUTANEOUS
  Administered 2019-07-24: 3 [IU] via SUBCUTANEOUS
  Administered 2019-07-24: 9 [IU] via SUBCUTANEOUS
  Administered 2019-07-24: 7 [IU] via SUBCUTANEOUS
  Administered 2019-07-25: 5 [IU] via SUBCUTANEOUS
  Administered 2019-07-25: 2 [IU] via SUBCUTANEOUS
  Administered 2019-07-25: 9 [IU] via SUBCUTANEOUS
  Administered 2019-07-26: 09:00:00 3 [IU] via SUBCUTANEOUS
  Administered 2019-07-26: 17:00:00 9 [IU] via SUBCUTANEOUS
  Administered 2019-07-26: 3 [IU] via SUBCUTANEOUS
  Administered 2019-07-27: 2 [IU] via SUBCUTANEOUS
  Administered 2019-07-27 (×2): 7 [IU] via SUBCUTANEOUS
  Administered 2019-07-28: 3 [IU] via SUBCUTANEOUS
  Administered 2019-07-28: 5 [IU] via SUBCUTANEOUS
  Administered 2019-07-28: 9 [IU] via SUBCUTANEOUS
  Administered 2019-07-29: 13:00:00 3 [IU] via SUBCUTANEOUS
  Administered 2019-07-29: 09:00:00 1 [IU] via SUBCUTANEOUS

## 2019-07-22 MED ORDER — DEXAMETHASONE SODIUM PHOSPHATE 10 MG/ML IJ SOLN: 6.0000 mg | INTRAMUSCULAR | Status: DC

## 2019-07-22 MED ORDER — SODIUM CHLORIDE 0.9 % IV SOLN
100.0000 mg | Freq: Every day | INTRAVENOUS | Status: AC
  Administered 2019-07-23 – 2019-07-26 (×4): 100 mg via INTRAVENOUS
  Filled 2019-07-22 (×6): qty 20

## 2019-07-22 MED ORDER — INSULIN DETEMIR 100 UNIT/ML ~~LOC~~ SOLN
20.0000 [IU] | Freq: Every day | SUBCUTANEOUS | Status: DC
  Administered 2019-07-22: 20 [IU] via SUBCUTANEOUS
  Filled 2019-07-22: qty 0.2

## 2019-07-22 MED ORDER — INSULIN DETEMIR 100 UNIT/ML ~~LOC~~ SOLN
0.1500 [IU]/kg | Freq: Two times a day (BID) | SUBCUTANEOUS | Status: DC
  Filled 2019-07-22 (×3): qty 0.1

## 2019-07-22 MED ORDER — INSULIN ASPART 100 UNIT/ML ~~LOC~~ SOLN
3.0000 [IU] | Freq: Three times a day (TID) | SUBCUTANEOUS | Status: DC
  Administered 2019-07-22 – 2019-07-26 (×12): 3 [IU] via SUBCUTANEOUS

## 2019-07-22 MED ORDER — ONDANSETRON HCL 4 MG/2ML IJ SOLN: 4.0000 mg | Freq: Four times a day (QID) | INTRAMUSCULAR | Status: DC | PRN

## 2019-07-22 MED ORDER — SODIUM CHLORIDE 0.9 % IV SOLN
200.0000 mg | Freq: Once | INTRAVENOUS | Status: AC
  Administered 2019-07-22: 06:00:00 200 mg via INTRAVENOUS
  Filled 2019-07-22: qty 40

## 2019-07-22 MED ORDER — ENOXAPARIN SODIUM 40 MG/0.4ML ~~LOC~~ SOLN
40.0000 mg | SUBCUTANEOUS | Status: DC
  Administered 2019-07-22 – 2019-07-29 (×8): 40 mg via SUBCUTANEOUS
  Filled 2019-07-22 (×8): qty 0.4

## 2019-07-22 MED ORDER — DEXAMETHASONE SODIUM PHOSPHATE 10 MG/ML IJ SOLN
6.0000 mg | Freq: Two times a day (BID) | INTRAMUSCULAR | Status: DC
  Administered 2019-07-22 – 2019-07-29 (×15): 6 mg via INTRAVENOUS
  Filled 2019-07-22 (×15): qty 1

## 2019-07-22 MED ORDER — INSULIN ASPART 100 UNIT/ML ~~LOC~~ SOLN
0.0000 [IU] | Freq: Three times a day (TID) | SUBCUTANEOUS | Status: DC
  Administered 2019-07-22: 15 [IU] via SUBCUTANEOUS

## 2019-07-22 MED ORDER — ACETAMINOPHEN 650 MG RE SUPP: 650.0000 mg | Freq: Four times a day (QID) | RECTAL | Status: DC | PRN

## 2019-07-22 NOTE — ED Notes (Signed)
Notified  Dr,wickline about poc   covid 19 was postive.

## 2019-07-22 NOTE — ED Notes (Signed)
Patient CBG was 395.

## 2019-07-22 NOTE — ED Notes (Signed)
[name] ([facility]) called @ 1110-per RN called by Levada Dy

## 2019-07-22 NOTE — Progress Notes (Signed)
Daughter Lexine Baton updated on patients condition.

## 2019-07-22 NOTE — Progress Notes (Signed)
Inpatient Diabetes Program Recommendations  AACE/ADA: New Consensus Statement on Inpatient Glycemic Control (2015)  Target Ranges:  Prepandial:   less than 140 mg/dL      Peak postprandial:   less than 180 mg/dL (1-2 hours)      Critically ill patients:  140 - 180 mg/dL   Lab Results  Component Value Date   GLUCAP 395 (H) 07/22/2019   HGBA1C 8.3 (H) 03/28/2019    Review of Glycemic Control Results for Jennifer Molina, Jennifer Molina (MRN [id number]) as of 07/22/2019 09:38  Ref. Range 07/22/2019 09:30  Glucose-Capillary Latest Ref Range: 70 - 99 mg/dL 395 (H)   Diabetes history: DM 2 Outpatient Diabetes medications: Amaryl 1 mg Daily, Metformin 1000 mg bid Current orders for Inpatient glycemic control: Novolog 0-15 units tid   Decadron 6 mg Q24 hours BUN/Creat: 11/0.81 No PO supplements at this time  Inpatient Diabetes Program Recommendations:    Noted Novolog Moderate Correction ordered this am. Pt receiving steroids due to COVID.  -  Please utilize the COVID 19 Glycemic Control order set initial SQ therapy option.  -  Consider Levemir 8-10 units bid (recommended by the order set).  -  Add Novolog hs scale  Thanks,  Tama Headings RN, MSN, BC-ADM Inpatient Diabetes Coordinator Team Pager [phone] (8a-5p)

## 2019-07-22 NOTE — H&P (Signed)
History and Physical    Jennifer Molina [id number] DOB: [birth year] DOA: 07/21/2019  PCP: Mack Hook, MD  Patient coming from: Home.  Chief Complaint: Nausea vomiting diarrhea.  [place] Opt[facility]rist used.  HPI: Jennifer Molina is a 63 y.o. female with history of diabetes mellitus, hypertension and dementia per the chart was brought to the ER after patient was not feeling well with flulike symptoms nausea vomiting diarrhea and cough for the last 4 days.  Over the last couple of days patient also has mild shortness of breath.  Denies any chest pain.  Given the symptoms patient presents to the ER.  Patient states her family members are also sick.  ED Course: In the ER patient was febrile with temperature 102 F tachycardic with chest x-ray showing bilateral infiltrates.  COVID-19 test was positive.  Labs showed WBC count of 6 high sensitive troponin of 9 and 12.  Blood glucose 311 AST 59 hemoglobin 10.6 platelets 197.  Lactic acid is 1.9.  Patient started on Decadron remdesivir admitted for acute respiratory failure and SIRS picture secondary to COVID-19.  Review of Systems: As per HPI, rest all negative.   Past Medical History:  Diagnosis Date   Dementia ([place]) 03/28/2019   DM (diabetes mellitus), type 2, uncontrolled ([place]) 2015   Has poor vision.  Family not aware of A1C or what sugars run.  Believe not well controlled.  Not able to monitor due to cost of monitoring equipment   Hypertension 2015    History reviewed. No pertinent surgical history.   reports that she has never smoked. She has never used smokeless tobacco. She reports that she does not drink alcohol or use drugs.  No Known Allergies  Family History  Problem Relation Age of Onset   Diabetes Mellitus II Neg Hx     Prior to Admission medications   Medication Sig Start Date End Date Taking? Authorizing Provider  acetaminophen (TYLENOL) 500 MG tablet Take 1,000 mg by mouth every 6 (six) hours as needed for mild  pain or fever.   Yes [provider]  amLODipine (NORVASC) 5 MG tablet Take 1 tablet (5 mg total) by mouth daily. 03/28/19   Mack Hook, MD  ASPIRIN LOW DOSE 81 MG EC tablet Take 81 mg by mouth daily.  03/17/19   [provider]  glimepiride (AMARYL) 1 MG tablet Take 1 tablet (1 mg total) by mouth daily with breakfast. 03/28/19   Mack Hook, MD  meloxicam (MOBIC) 7.5 MG tablet Take 1 tablet (7.5 mg total) by mouth daily. 03/28/19   Mack Hook, MD  metFORMIN (GLUCOPHAGE) 1000 MG tablet Take 1 tablet (1,000 mg total) by mouth 2 (two) times daily with a meal. 03/28/19   Mack Hook, MD  metoprolol tartrate (LOPRESSOR) 100 MG tablet Take 1 tablet (100 mg total) by mouth 2 (two) times daily. 03/28/19   Mack Hook, MD    Physical Exam: Constitutional: Moderately built and nourished. Vitals:   07/22/19 0300 07/22/19 0330 07/22/19 0400 07/22/19 0430  BP: (!) 166/95 (!) 175/96 (!) 174/97 (!) 176/88  Pulse: 72 99 94 93  Resp: (!) 30 (!) 24 18 (!) 24  Temp:      TempSrc:      SpO2: 93% 96% 91% 95%  Weight:      Height:       Eyes: Anicteric no pallor. ENMT: No discharge from the ears eyes nose or mouth. Neck: No mass felt.  No neck rigidity. Respiratory: No rhonchi or crepitations. Cardiovascular:  S1-S2 heard. Abdomen: Soft nontender bowel sounds present. Musculoskeletal: No edema.  No joint effusion. Skin: No rash. Neurologic: Alert awake oriented to time place and person.  Moves all extremities. Psychiatric: Appears normal but normal affect.   Labs on Admission: I have personally reviewed following labs and imaging studies  CBC: Recent Labs  Lab 07/21/19 1905  WBC 6.0  HGB 10.6*  HCT 32.2*  MCV 78.7*  PLT [id number]   Basic Metabolic Panel: Recent Labs  Lab 07/21/19 1905  NA 130*  K 3.6  CL 98  CO2 22  GLUCOSE 311*  BUN 11  CREATININE 0.94  CALCIUM 8.5*   GFR: Estimated Creatinine Clearance: 52.7 mL/min (by C-G  formula based on SCr of 0.94 mg/dL). Liver Function Tests: Recent Labs  Lab 07/21/19 1905  AST 59*  ALT 38  ALKPHOS 36*  BILITOT 0.5  PROT 7.5  ALBUMIN 2.9*   Recent Labs  Lab 07/21/19 1905  LIPASE 34   No results for input(s): AMMONIA in the last 168 hours. Coagulation Profile: No results for input(s): INR, PROTIME in the last 168 hours. Cardiac Enzymes: No results for input(s): CKTOTAL, CKMB, CKMBINDEX, TROPONINI in the last 168 hours. BNP (last 3 results) No results for input(s): PROBNP in the last 8760 hours. HbA1C: No results for input(s): HGBA1C in the last 72 hours. CBG: No results for input(s): GLUCAP in the last 168 hours. Lipid Profile: No results for input(s): CHOL, HDL, LDLCALC, TRIG, CHOLHDL, LDLDIRECT in the last 72 hours. Thyroid Function Tests: No results for input(s): TSH, T4TOTAL, FREET4, T3FREE, THYROIDAB in the last 72 hours. Anemia Panel: No results for input(s): VITAMINB12, FOLATE, FERRITIN, TIBC, IRON, RETICCTPCT in the last 72 hours. Urine analysis: No results found for: COLORURINE, APPEARANCEUR, LABSPEC, PHURINE, GLUCOSEU, HGBUR, BILIRUBINUR, KETONESUR, PROTEINUR, UROBILINOGEN, NITRITE, LEUKOCYTESUR Sepsis Labs: @LABRCNTIP (procalcitonin:4,lacticidven:4) )No results found for this or any previous visit (from the past 240 hour(s)).   Radiological Exams on Admission: Dg Chest Portable 1 View  Result Date: 07/21/2019 CLINICAL DATA:  63 year old female with fever and cough for several days. COVID-19 test pending. EXAM: PORTABLE CHEST 1 VIEW COMPARISON:  None. FINDINGS: Portable AP semi upright view at 2326 hours. Multifocal patchy and confluent mostly peripheral bilateral pulmonary opacity sparing the lung apices. Superimposed low lung volumes. Mediastinal contours remain normal. No pneumothorax, pulmonary edema or pleural effusion. Visualized tracheal air column is within normal limits. Negative visible bowel gas pattern. No acute osseous abnormality  identified. IMPRESSION: Patchy and confluent bilateral peripheral mid and lower lung opacity, most compatible with bilateral pneumonia in this clinical setting. This is a pattern frequently seen with COVID-19. Electronically Signed   By: Genevie Ann M.D.   On: 07/21/2019 23:36    EKG: Independently reviewed.  Sinus tachycardia.  Assessment/Plan Principal Problem:   Pneumonia due to COVID-19 virus Active Problems:   DM (diabetes mellitus), type 2, uncontrolled ([facility])   Hypertension   SIRS (systemic inflammatory response syndrome) ([place])    1. Acute respiratory failure hypoxia with source which is secondary to Covid pneumonia for which I have started patient on Decadron and remdesivir.  Closely follow respiratory status. 2. Hyponatremia likely from dehydration.  Follow metabolic panel closely. 3. Nausea vomiting and diarrhea likely from Covid pneumonia.  Abdomen appears benign.  AST is mildly elevated follow LFTs. 4. Diabetes mellitus type 2 with hyperglycemia -since patient is on Decadron I have placed patient on moderate dose sliding scale.  Patient may need long-acting insulin until the Decadron is completed. 5. Hypertension  uncontrolled on metoprolol and amlodipine.  We will keep patient on as needed IV hydralazine. 6. Normocytic normochromic anemia appears to be chronic but mildly worsened from previous.  Follow CBC closely. 7. History of dementia per the chart.  Since patient has SIRS and respiratory failure with Covid infection will need inpatient status.   DVT prophylaxis: Lovenox. Code Status: Full code. Family Communication: We will need to discuss with family. Disposition Plan: Home. Consults called: None. Admission status: Inpatient.   Rise Patience MD [facility] Pager [phone].  If 7PM-7AM, please contact night-coverage [email or web address] Password [facility]  07/22/2019, 4:42 AM

## 2019-07-22 NOTE — ED Notes (Signed)
Ordered breakfast--Jennifer Molina 

## 2019-07-22 NOTE — ED Notes (Signed)
Lunch Tray Ordered @ 1020.  

## 2019-07-22 NOTE — ED Notes (Signed)
Pt left via [facility] for transfer to [facility]

## 2019-07-22 NOTE — ED Provider Notes (Signed)
I assumed care from the resident Dr. Rich Reining Patient presents with fever vomiting diarrhea.  Multiple sick contacts.  Patient noted to be Covid positive with signs of pneumonia  she is requiring 3 L of oxygen due to hypoxia Patient's native language is [place], [place] is her second language.  It was very difficult using language interpreter.  Daughter at bedside was helpful, but daughter is also having symptoms of Covid and has to leave the hospital. Patient placed on Decadron 6 mg IV Discussed with Dr. Hal Hope for admission   Ripley Fraise, MD 07/22/19 [phone]

## 2019-07-22 NOTE — Plan of Care (Signed)

## 2019-07-22 NOTE — Progress Notes (Addendum)
Care started in the emergency room prior to midnight yesterday and patient admitted early this morning after midnight and I have reviewed the H&P done by Dr. Gean Birchwood and I am in current agreement with his assessment and plan.  Additional changes plan of care been made accordingly.  On my brief evaluation, patient appears stable on 4 L of supplemental oxygen via nasal cannula and saturating 94%.   The patient is a 63 year old [place] female with a past medical history significant for but not limited to diabetes mellitus type II, dementia, hypertension and other comorbidities who presented to the ED with a chief complaint of nausea, vomiting and diarrhea.  She is not feeling well and had flulike symptoms with his nausea, vomiting, diarrhea and cough for the last 4 days and also stated she had mild shortness of breath.  Given the symptoms the patient presented to the ED and stated her family members are also sick.  In the ER she was found to be febrile with temperature of 102 and was tachycardic with chest x-ray showing bilateral infiltrates.  Point-of-care testing for COVID-19 disease was positive.  She required 4 L of supplemental oxygen via nasal cannula and was admitted for acute hypoxic respiratory failure secondary to COVID-19 pneumonia and had a SIRS picture.  Patient was also hyperglycemic so the Covid glycemic control protocol has been used and she is started on Levemir 10 units twice daily along with moderate NovoLog/scale every 4.  Patient will be transferred to [place] for further care and will continue on remdesivir, steroids.  We will trend daily inflammatory markers and monitor her clinical response to intervention given that she has an elevated ferritin as well as CRP.  Inflammatory Markers and COVID-19 Labs  Recent Labs    07/22/19 0530  FERRITIN 1,690*  CRP 6.7*    No results found for: SARSCOV2NAA  SpO2: 93 %  Currently she is stable to be transferred  to [facility] for further treatment of her COVID-19 disease

## 2019-07-22 NOTE — Progress Notes (Addendum)
   Skin intact   Skin assessment completed with Lorenso Courier, RN

## 2019-07-22 NOTE — ED Notes (Signed)
ED Provider at bedside. 

## 2019-07-23 ENCOUNTER — Inpatient Hospital Stay (HOSPITAL_COMMUNITY): Payer: Medicaid Other

## 2019-07-23 DIAGNOSIS — A4189 Other specified sepsis: Principal | ICD-10-CM

## 2019-07-23 DIAGNOSIS — J9601 Acute respiratory failure with hypoxia: Secondary | ICD-10-CM

## 2019-07-23 LAB — D-DIMER, QUANTITATIVE: D-Dimer, Quant: 1.28 ug/mL-FEU — ABNORMAL HIGH (ref 0.00–0.50)

## 2019-07-23 LAB — CBC WITH DIFFERENTIAL/PLATELET
Abs Immature Granulocytes: 0.03 10*3/uL (ref 0.00–0.07)
Basophils Absolute: 0 10*3/uL (ref 0.0–0.1)
Basophils Relative: 0 %
Eosinophils Absolute: 0 10*3/uL (ref 0.0–0.5)
Eosinophils Relative: 0 %
HCT: 31.3 % — ABNORMAL LOW (ref 36.0–46.0)
Hemoglobin: 10.4 g/dL — ABNORMAL LOW (ref 12.0–15.0)
Immature Granulocytes: 1 %
Lymphocytes Relative: 18 %
Lymphs Abs: 1.1 10*3/uL (ref 0.7–4.0)
MCH: 25.8 pg — ABNORMAL LOW (ref 26.0–34.0)
MCHC: 33.2 g/dL (ref 30.0–36.0)
MCV: 77.7 fL — ABNORMAL LOW (ref 80.0–100.0)
Monocytes Absolute: 0.2 10*3/uL (ref 0.1–1.0)
Monocytes Relative: 3 %
Neutro Abs: 4.8 10*3/uL (ref 1.7–7.7)
Neutrophils Relative %: 78 %
Platelets: 266 10*3/uL (ref 150–400)
RBC: 4.03 MIL/uL (ref 3.87–5.11)
RDW: 12.9 % (ref 11.5–15.5)
WBC: 6.1 10*3/uL (ref 4.0–10.5)
nRBC: 0 % (ref 0.0–0.2)

## 2019-07-23 LAB — COMPREHENSIVE METABOLIC PANEL
ALT: 50 U/L — ABNORMAL HIGH (ref 0–44)
AST: 79 U/L — ABNORMAL HIGH (ref 15–41)
Albumin: 2.7 g/dL — ABNORMAL LOW (ref 3.5–5.0)
Alkaline Phosphatase: 37 U/L — ABNORMAL LOW (ref 38–126)
Anion gap: 11 (ref 5–15)
BUN: 22 mg/dL (ref 8–23)
CO2: 22 mmol/L (ref 22–32)
Calcium: 8.6 mg/dL — ABNORMAL LOW (ref 8.9–10.3)
Chloride: 100 mmol/L (ref 98–111)
Creatinine, Ser: 0.78 mg/dL (ref 0.44–1.00)
GFR calc Af Amer: >60 mL/min (ref >60)
GFR calc non Af Amer: >60 mL/min (ref >60)
Glucose, Bld: 239 mg/dL — ABNORMAL HIGH (ref 70–99)
Potassium: 4.2 mmol/L (ref 3.5–5.1)
Sodium: 133 mmol/L — ABNORMAL LOW (ref 135–145)
Total Bilirubin: 0.3 mg/dL (ref 0.3–1.2)
Total Protein: 7.2 g/dL (ref 6.5–8.1)

## 2019-07-23 LAB — GLUCOSE, CAPILLARY
Glucose-Capillary: 241 mg/dL — ABNORMAL HIGH (ref 70–99)
Glucose-Capillary: 292 mg/dL — ABNORMAL HIGH (ref 70–99)
Glucose-Capillary: 364 mg/dL — ABNORMAL HIGH (ref 70–99)
Glucose-Capillary: 382 mg/dL — ABNORMAL HIGH (ref 70–99)
Glucose-Capillary: 404 mg/dL — ABNORMAL HIGH (ref 70–99)

## 2019-07-23 LAB — PROCALCITONIN: Procalcitonin: 0.2 ng/mL

## 2019-07-23 LAB — ABO/RH: ABO/RH(D): AB POS

## 2019-07-23 LAB — PHOSPHORUS: Phosphorus: 3.4 mg/dL (ref 2.5–4.6)

## 2019-07-23 LAB — C-REACTIVE PROTEIN: CRP: 5.5 mg/dL — ABNORMAL HIGH (ref <1.0)

## 2019-07-23 LAB — MAGNESIUM: Magnesium: 1.7 mg/dL (ref 1.7–2.4)

## 2019-07-23 MED ORDER — INSULIN DETEMIR 100 UNIT/ML ~~LOC~~ SOLN
20.0000 [IU] | Freq: Two times a day (BID) | SUBCUTANEOUS | Status: DC
  Administered 2019-07-23 (×2): 20 [IU] via SUBCUTANEOUS
  Filled 2019-07-23 (×3): qty 0.2

## 2019-07-23 MED ORDER — SODIUM CHLORIDE 0.9% IV SOLUTION
Freq: Once | INTRAVENOUS | Status: AC
  Administered 2019-07-23: 10:00:00 via INTRAVENOUS

## 2019-07-23 NOTE — Progress Notes (Signed)
[facility] PROGRESS NOTE    Progress Note  Jennifer Molina  [id number] DOB: [birth year] DOA: 07/21/2019 PCP: Mack Hook, MD     Brief Narrative:   Jennifer Molina is an 63 y.o. female past medical history of diabetes mellitus type 2, essential hypertension was brought in the ED as he was having flulike symptoms nausea vomiting and diarrhea for 4 days prior to admission.  Over the last couple of days he has developed mild shortness of breath in the ER he was found to have a temperature 100.2, tachycardia, hypoxic and tachypneic, chest x-ray showed bilateral infiltrate SARS-CoV-2 PCR was positive.  He was transferred to [facility].  Assessment/Plan:   Sepsis/acute respiratory failure with hypoxia due to Pneumonia due to COVID-19 virus This morning he is requiring 6 L of oxygen to keep saturations greater than 94%. He was started on IV remdesivir and steroids.  Continue vitamin C and zinc. Try to keep the patient peripherally 16 hours a day, if not in the prone position out of bed to chair. Subtherapeutic dose of Lovenox.  DM (diabetes mellitus), type 2, uncontrolled ([place]) Will hold all oral hypoglycemic agents, A1c 8.9, currently on long-acting insulin plus sliding scale blood glucose remains elevated. We will increase long-acting insulin.  Essential  Hypertension Seems to be well controlled, continue Norvasc and metoprolol.   DVT prophylaxis: lovenox Family Communication:none Disposition Plan/Barrier to D/C: unable to determine Code Status:     Code Status Orders  (From admission, onward)         Start     Ordered   07/22/19 0441  Full code  Continuous     07/22/19 0441        Code Status History    This patient has a current code status but no historical code status.   Advance Care Planning Activity        IV Access:    Peripheral IV   Procedures and diagnostic studies:   Dg Chest Portable 1 View  Result Date: 07/21/2019 CLINICAL DATA:  63 year old  female with fever and cough for several days. COVID-19 test pending. EXAM: PORTABLE CHEST 1 VIEW COMPARISON:  None. FINDINGS: Portable AP semi upright view at 2326 hours. Multifocal patchy and confluent mostly peripheral bilateral pulmonary opacity sparing the lung apices. Superimposed low lung volumes. Mediastinal contours remain normal. No pneumothorax, pulmonary edema or pleural effusion. Visualized tracheal air column is within normal limits. Negative visible bowel gas pattern. No acute osseous abnormality identified. IMPRESSION: Patchy and confluent bilateral peripheral mid and lower lung opacity, most compatible with bilateral pneumonia in this clinical setting. This is a pattern frequently seen with COVID-19. Electronically Signed   By: Genevie Ann M.D.   On: 07/21/2019 23:36     Medical Consultants:    None.  Anti-Infectives:   IV remdesivir  Subjective:    Jennifer Molina relates she feels tired, she relates her breathing is not better than yesterday  Objective:    Vitals:   07/23/19 0034 07/23/19 0415 07/23/19 0500 07/23/19 0754  BP: 133/76 108/85  135/71  Pulse: 76   79  Resp: (!) 33 (!) 33  17  Temp: 98.8 F (37.1 C) 98.4 F (36.9 C)  98 F (36.7 C)  TempSrc: Oral Oral  Oral  SpO2: 92% 94%  90%  Weight:   66 kg   Height:       SpO2: 90 % O2 Flow Rate (L/min): 6 L/min  No intake or output data in the 24 hours ending  07/23/19 0803 Filed Weights   07/22/19 0008 07/23/19 0500  Weight: 68 kg 66 kg    Exam: General exam: In no acute distress. Respiratory system: Good air movement and diffuse crackles bilaterally. Cardiovascular system: S1 & S2 heard, RRR. No JVD. Gastrointestinal system: Abdomen is nondistended, soft and nontender.  Central nervous system: Alert and oriented. No focal neurological deficits. Extremities: No pedal edema. Skin: No rashes, lesions or ulcers    Data Reviewed:    Labs: Basic Metabolic Panel: Recent Labs  Lab 07/21/19 1905 07/22/19  0530 07/23/19 0224  NA 130* 129* 133*  K 3.6 4.2 4.2  CL 98 99 100  CO2 22 20* 22  GLUCOSE 311* 296* 239*  BUN 11 11 22   CREATININE 0.94 0.81 0.78  CALCIUM 8.5* 8.1* 8.6*  MG  --   --  1.7  PHOS  --   --  3.4   GFR Estimated Creatinine Clearance: 61 mL/min (by C-G formula based on SCr of 0.78 mg/dL). Liver Function Tests: Recent Labs  Lab 07/21/19 1905 07/22/19 0530 07/23/19 0224  AST 59* 52* 79*  ALT 38 34 50*  ALKPHOS 36* 37* 37*  BILITOT 0.5 0.6 0.3  PROT 7.5 7.3 7.2  ALBUMIN 2.9* 2.7* 2.7*   Recent Labs  Lab 07/21/19 1905  LIPASE 34   No results for input(s): AMMONIA in the last 168 hours. Coagulation profile No results for input(s): INR, PROTIME in the last 168 hours. COVID-19 Labs  Recent Labs    07/22/19 0530 07/23/19 0224  DDIMER  --  1.28*  FERRITIN 1,690*  --   CRP 6.7* 5.5*    No results found for: SARSCOV2NAA  CBC: Recent Labs  Lab 07/21/19 1905 07/22/19 0530 07/23/19 0224  WBC 6.0 5.1 6.1  NEUTROABS  --  4.0 4.8  HGB 10.6* 10.1* 10.4*  HCT 32.2* 31.0* 31.3*  MCV 78.7* 78.1* 77.7*  PLT 197 214 266   Cardiac Enzymes: No results for input(s): CKTOTAL, CKMB, CKMBINDEX, TROPONINI in the last 168 hours. BNP (last 3 results) No results for input(s): PROBNP in the last 8760 hours. CBG: Recent Labs  Lab 07/22/19 0930 07/22/19 1241 07/22/19 1612 07/22/19 2028 07/23/19 0033  GLUCAP 395* 263* 125* 293* 241*   D-Dimer: Recent Labs    07/23/19 0224  DDIMER 1.28*   Hgb A1c: Recent Labs    07/22/19 0530  HGBA1C 8.9*   Lipid Profile: No results for input(s): CHOL, HDL, LDLCALC, TRIG, CHOLHDL, LDLDIRECT in the last 72 hours. Thyroid function studies: No results for input(s): TSH, T4TOTAL, T3FREE, THYROIDAB in the last 72 hours.  Invalid input(s): FREET3 Anemia work up: Recent Labs    07/22/19 0530  FERRITIN 1,690*   Sepsis Labs: Recent Labs  Lab 07/21/19 1905 07/22/19 0530 07/23/19 0224  PROCALCITON  --   --  0.20   WBC 6.0 5.1 6.1  LATICACIDVEN 1.9  --   --    Microbiology No results found for this or any previous visit (from the past 240 hour(s)).   Medications:   . amLODipine  5 mg Oral Daily  . aspirin EC  81 mg Oral Daily  . dexamethasone (DECADRON) injection  6 mg Intravenous Q12H  . enoxaparin (LOVENOX) injection  40 mg Subcutaneous Q24H  . insulin aspart  0-5 Units Subcutaneous QHS  . insulin aspart  0-9 Units Subcutaneous TID WC  . insulin aspart  3 Units Subcutaneous TID WC  . insulin detemir  20 Units Subcutaneous QHS  . metoprolol tartrate  100 mg Oral BID   Continuous Infusions: . remdesivir 100 mg in NS 100 mL        LOS: 1 day   Charlynne Cousins  [facility]  07/23/2019, 8:03 AM

## 2019-07-23 NOTE — Progress Notes (Signed)
Pt desatted upon ambulation to the bathroom to low 6o's, shortness of breath noted. Pt increased to 15L HFNC, pt recovered to 89-92. MD notifed. Will continue to monitor patient.

## 2019-07-23 NOTE — Progress Notes (Signed)
Daughter Lexine Baton updated on patients condition, all questions answered.

## 2019-07-23 NOTE — Plan of Care (Signed)

## 2019-07-24 LAB — GLUCOSE, CAPILLARY
Glucose-Capillary: 237 mg/dL — ABNORMAL HIGH (ref 70–99)
Glucose-Capillary: 387 mg/dL — ABNORMAL HIGH (ref 70–99)
Glucose-Capillary: 348 mg/dL — ABNORMAL HIGH (ref 70–99)
Glucose-Capillary: 331 mg/dL — ABNORMAL HIGH (ref 70–99)
Glucose-Capillary: 404 mg/dL — ABNORMAL HIGH (ref 70–99)

## 2019-07-24 LAB — PROCALCITONIN: Procalcitonin: 0.13 ng/mL

## 2019-07-24 LAB — C-REACTIVE PROTEIN: CRP: 1.9 mg/dL — ABNORMAL HIGH (ref <1.0)

## 2019-07-24 LAB — D-DIMER, QUANTITATIVE: D-Dimer, Quant: 1.36 ug/mL-FEU — ABNORMAL HIGH (ref 0.00–0.50)

## 2019-07-24 MED ORDER — INSULIN DETEMIR 100 UNIT/ML ~~LOC~~ SOLN
30.0000 [IU] | Freq: Two times a day (BID) | SUBCUTANEOUS | Status: DC
  Administered 2019-07-24 – 2019-07-29 (×10): 30 [IU] via SUBCUTANEOUS
  Filled 2019-07-24 (×11): qty 0.3

## 2019-07-24 MED ORDER — TOCILIZUMAB 400 MG/20ML IV SOLN
8.0000 mg/kg | Freq: Once | INTRAVENOUS | Status: AC
  Administered 2019-07-24: 15:00:00 528 mg via INTRAVENOUS
  Filled 2019-07-24: qty 26.4

## 2019-07-24 MED ORDER — INSULIN DETEMIR 100 UNIT/ML ~~LOC~~ SOLN
25.0000 [IU] | Freq: Two times a day (BID) | SUBCUTANEOUS | Status: DC
  Administered 2019-07-24: 25 [IU] via SUBCUTANEOUS
  Filled 2019-07-24 (×2): qty 0.25

## 2019-07-24 NOTE — Progress Notes (Signed)
[facility] PROGRESS NOTE    Progress Note  Jennifer Molina  [id number] DOB: May 31, 1956 DOA: 07/21/2019 PCP: Mack Hook, MD     Brief Narrative:   Jennifer Molina is an 63 y.o. female past medical history of diabetes mellitus type 2, essential hypertension was brought in the ED as he was having flulike symptoms nausea vomiting and diarrhea for 4 days prior to admission.  Over the last couple of days he has developed mild shortness of breath in the ER he was found to have a temperature 100.2, tachycardia, hypoxic and tachypneic, chest x-ray showed bilateral infiltrate SARS-CoV-2 PCR was positive.  He was transferred to [facility].  Assessment/Plan:   Sepsis/acute respiratory failure with hypoxia due to Pneumonia due to COVID-19 virus This morning Jennifer Molina required 15 L of high flow nasal cannula to keep saturations greater than 90%. We will continue IV remdesivir and steroids, vitamin C and zinc. She did receive 1 unit of convalescent plasma for COVID-19 on 07/23/2019. As her oxygen requirements are getting worst, I did speak to the patient and daughter about Actemra. The treatment plan and use of medications (Actemra) and known side effects were discussed with patient/family, they were clearly explained that there is no proven definitive treatment for COVID-19 infection, any medications used here are based on published clinical articles/anecdotal data which are not peer-reviewed or randomized control trials.  Complete risks and long-term side effects are unknown, however in the best clinical judgment they seem to be of some clinical benefit rather than medical risks.  Patient/family agree with the treatment plan and want to receive the given medications. Check keep the patient prone for at least 16 hours a day, if not prone out of bed to chair. Continue subtherapeutic Lovenox. Procalcitonin less than 0.1.  DM (diabetes mellitus), type 2, uncontrolled ([place]) With an A1c of 8.9. Currently  on long-acting insulin plus sliding scale. Blood glucose is improved,, but still elevated we will slightly increase long-acting insulin.  Essential  Hypertension Seems to be well controlled, continue Norvasc and metoprolol.   DVT prophylaxis: lovenox Family Communication:none Disposition Plan/Barrier to D/C: unable to determine Code Status:     Code Status Orders  (From admission, onward)         Start     Ordered   07/22/19 0441  Full code  Continuous     07/22/19 0441        Code Status History    This patient has a current code status but no historical code status.   Advance Care Planning Activity        IV Access:    Peripheral IV   Procedures and diagnostic studies:   Dg Chest Port 1 View  Result Date: 07/23/2019 CLINICAL DATA:  Evaluate pneumonia due to COVID. EXAM: PORTABLE CHEST 1 VIEW COMPARISON:  10/20/2010 FINDINGS: Heart size normal. Decreased lung volumes with asymmetric elevation of right hemidiaphragm. Persistent, bilateral peripheral predominant interstitial and airspace densities are again noted and appear increased from previous exam IMPRESSION: 1. Worsening aeration to the lungs bilaterally compatible with progressive pneumonia. 2. No new findings. 3. Stable asymmetric elevation of right hemidiaphragm. Electronically Signed   By: Kerby Moors M.D.   On: 07/23/2019 10:58     Medical Consultants:    None.  Anti-Infectives:   IV remdesivir  Subjective:    Jennifer Molina she relates she feels more tired than yesterday she relates her breathing is about the same.  Objective:    Vitals:   07/24/19 0030  07/24/19 0410 07/24/19 0500 07/24/19 0718  BP:  (!) 142/76  (!) 154/78  Pulse:    78  Resp: (!) 25 (!) 24  (!) 21  Temp:  98.6 F (37 C)  98 F (36.7 C)  TempSrc:  Oral  Oral  SpO2: 91% 94%  90%  Weight:   65.9 kg   Height:       SpO2: 90 % O2 Flow Rate (L/min): 15 L/min   Intake/Output Summary (Last 24 hours) at 07/24/2019 0825  Last data filed at 07/24/2019 0400 Gross per 24 hour  Intake 756 ml  Output -  Net 756 ml   Filed Weights   07/22/19 0008 07/23/19 0500 07/24/19 0500  Weight: 68 kg 66 kg 65.9 kg    Exam: General exam: In no acute distress. Respiratory system: Good air movement and diffuse crackles. Cardiovascular system: S1 & S2 heard, RRR. No JVD. Gastrointestinal system: Abdomen is nondistended, soft and nontender.  Central nervous system: Alert and oriented. No focal neurological deficits. Extremities: No pedal edema. Skin: No rashes, lesions or ulcers  Data Reviewed:    Labs: Basic Metabolic Panel: Recent Labs  Lab 07/21/19 1905 07/22/19 0530 07/23/19 0224  NA 130* 129* 133*  K 3.6 4.2 4.2  CL 98 99 100  CO2 22 20* 22  GLUCOSE 311* 296* 239*  BUN 11 11 22   CREATININE 0.94 0.81 0.78  CALCIUM 8.5* 8.1* 8.6*  MG  --   --  1.7  PHOS  --   --  3.4   GFR Estimated Creatinine Clearance: 60.9 mL/min (by C-G formula based on SCr of 0.78 mg/dL). Liver Function Tests: Recent Labs  Lab 07/21/19 1905 07/22/19 0530 07/23/19 0224  AST 59* 52* 79*  ALT 38 34 50*  ALKPHOS 36* 37* 37*  BILITOT 0.5 0.6 0.3  PROT 7.5 7.3 7.2  ALBUMIN 2.9* 2.7* 2.7*   Recent Labs  Lab 07/21/19 1905  LIPASE 34   No results for input(s): AMMONIA in the last 168 hours. Coagulation profile No results for input(s): INR, PROTIME in the last 168 hours. COVID-19 Labs  Recent Labs    07/22/19 0530 07/23/19 0224 07/24/19 0350  DDIMER  --  1.28*  --   FERRITIN 1,690*  --   --   CRP 6.7* 5.5* 1.9*    No results found for: SARSCOV2NAA  CBC: Recent Labs  Lab 07/21/19 1905 07/22/19 0530 07/23/19 0224  WBC 6.0 5.1 6.1  NEUTROABS  --  4.0 4.8  HGB 10.6* 10.1* 10.4*  HCT 32.2* 31.0* 31.3*  MCV 78.7* 78.1* 77.7*  PLT 197 214 266   Cardiac Enzymes: No results for input(s): CKTOTAL, CKMB, CKMBINDEX, TROPONINI in the last 168 hours. BNP (last 3 results) No results for input(s): PROBNP in the  last 8760 hours. CBG: Recent Labs  Lab 07/23/19 0758 07/23/19 1120 07/23/19 1553 07/23/19 1945 07/24/19 0721  GLUCAP 292* 364* 382* 404* 237*   D-Dimer: Recent Labs    07/23/19 0224  DDIMER 1.28*   Hgb A1c: Recent Labs    07/22/19 0530  HGBA1C 8.9*   Lipid Profile: No results for input(s): CHOL, HDL, LDLCALC, TRIG, CHOLHDL, LDLDIRECT in the last 72 hours. Thyroid function studies: No results for input(s): TSH, T4TOTAL, T3FREE, THYROIDAB in the last 72 hours.  Invalid input(s): FREET3 Anemia work up: Recent Labs    07/22/19 0530  FERRITIN 1,690*   Sepsis Labs: Recent Labs  Lab 07/21/19 1905 07/22/19 0530 07/23/19 0224 07/24/19 0350  PROCALCITON  --   --  0.20 0.13  WBC 6.0 5.1 6.1  --   LATICACIDVEN 1.9  --   --   --    Microbiology No results found for this or any previous visit (from the past 240 hour(s)).   Medications:   . amLODipine  5 mg Oral Daily  . aspirin EC  81 mg Oral Daily  . dexamethasone (DECADRON) injection  6 mg Intravenous Q12H  . enoxaparin (LOVENOX) injection  40 mg Subcutaneous Q24H  . insulin aspart  0-5 Units Subcutaneous QHS  . insulin aspart  0-9 Units Subcutaneous TID WC  . insulin aspart  3 Units Subcutaneous TID WC  . insulin detemir  20 Units Subcutaneous BID  . metoprolol tartrate  100 mg Oral BID   Continuous Infusions: . remdesivir 100 mg in NS 100 mL Stopped (07/23/19 1105)      LOS: 2 days   Charlynne Cousins  [facility]  07/24/2019, 8:25 AM

## 2019-07-24 NOTE — Plan of Care (Signed)

## 2019-07-25 LAB — CBC WITH DIFFERENTIAL/PLATELET
Abs Immature Granulocytes: 0.13 10*3/uL — ABNORMAL HIGH (ref 0.00–0.07)
Basophils Absolute: 0 10*3/uL (ref 0.0–0.1)
Basophils Relative: 0 %
Eosinophils Absolute: 0 10*3/uL (ref 0.0–0.5)
Eosinophils Relative: 0 %
HCT: 32.7 % — ABNORMAL LOW (ref 36.0–46.0)
Hemoglobin: 10.7 g/dL — ABNORMAL LOW (ref 12.0–15.0)
Immature Granulocytes: 1 %
Lymphocytes Relative: 9 %
Lymphs Abs: 1 10*3/uL (ref 0.7–4.0)
MCH: 25.8 pg — ABNORMAL LOW (ref 26.0–34.0)
MCHC: 32.7 g/dL (ref 30.0–36.0)
MCV: 79 fL — ABNORMAL LOW (ref 80.0–100.0)
Monocytes Absolute: 0.5 10*3/uL (ref 0.1–1.0)
Monocytes Relative: 5 %
Neutro Abs: 9 10*3/uL — ABNORMAL HIGH (ref 1.7–7.7)
Neutrophils Relative %: 85 %
Platelets: 364 10*3/uL (ref 150–400)
RBC: 4.14 MIL/uL (ref 3.87–5.11)
RDW: 13.3 % (ref 11.5–15.5)
WBC: 10.6 10*3/uL — ABNORMAL HIGH (ref 4.0–10.5)
nRBC: 0 % (ref 0.0–0.2)

## 2019-07-25 LAB — COMPREHENSIVE METABOLIC PANEL
ALT: 72 U/L — ABNORMAL HIGH (ref 0–44)
AST: 61 U/L — ABNORMAL HIGH (ref 15–41)
Albumin: 2.7 g/dL — ABNORMAL LOW (ref 3.5–5.0)
Alkaline Phosphatase: 46 U/L (ref 38–126)
Anion gap: 12 (ref 5–15)
BUN: 34 mg/dL — ABNORMAL HIGH (ref 8–23)
CO2: 23 mmol/L (ref 22–32)
Calcium: 8.9 mg/dL (ref 8.9–10.3)
Chloride: 99 mmol/L (ref 98–111)
Creatinine, Ser: 0.93 mg/dL (ref 0.44–1.00)
GFR calc Af Amer: >60 mL/min (ref >60)
GFR calc non Af Amer: >60 mL/min (ref >60)
Glucose, Bld: 262 mg/dL — ABNORMAL HIGH (ref 70–99)
Potassium: 4.8 mmol/L (ref 3.5–5.1)
Sodium: 134 mmol/L — ABNORMAL LOW (ref 135–145)
Total Bilirubin: 0.2 mg/dL — ABNORMAL LOW (ref 0.3–1.2)
Total Protein: 7.1 g/dL (ref 6.5–8.1)

## 2019-07-25 LAB — D-DIMER, QUANTITATIVE: D-Dimer, Quant: 1.42 ug/mL-FEU — ABNORMAL HIGH (ref 0.00–0.50)

## 2019-07-25 LAB — PREPARE FRESH FROZEN PLASMA

## 2019-07-25 LAB — URINALYSIS, ROUTINE W REFLEX MICROSCOPIC
Bilirubin Urine: NEGATIVE
Glucose, UA: >=500 mg/dL — AB
Hgb urine dipstick: NEGATIVE
Ketones, ur: NEGATIVE mg/dL
Nitrite: NEGATIVE
Protein, ur: 30 mg/dL — AB
Specific Gravity, Urine: 1.021 (ref 1.005–1.030)
pH: 6 (ref 5.0–8.0)

## 2019-07-25 LAB — GLUCOSE, CAPILLARY
Glucose-Capillary: 193 mg/dL — ABNORMAL HIGH (ref 70–99)
Glucose-Capillary: 372 mg/dL — ABNORMAL HIGH (ref 70–99)
Glucose-Capillary: 292 mg/dL — ABNORMAL HIGH (ref 70–99)
Glucose-Capillary: 382 mg/dL — ABNORMAL HIGH (ref 70–99)

## 2019-07-25 LAB — BPAM FFP
Blood Product Expiration Date: 202012061805
ISSUE DATE / TIME: 202012051809
Unit Type and Rh: 8400

## 2019-07-25 LAB — C-REACTIVE PROTEIN: CRP: 1.2 mg/dL — ABNORMAL HIGH (ref <1.0)

## 2019-07-25 MED ORDER — HYDROCODONE-ACETAMINOPHEN 5-325 MG PO TABS: 1.0000 | ORAL_TABLET | Freq: Four times a day (QID) | ORAL | Status: DC | PRN

## 2019-07-25 MED ORDER — CLOTRIMAZOLE 1 % EX CREA
TOPICAL_CREAM | Freq: Two times a day (BID) | CUTANEOUS | Status: DC
  Administered 2019-07-25 – 2019-07-27 (×3): via TOPICAL
  Filled 2019-07-25: qty 15

## 2019-07-25 NOTE — Progress Notes (Signed)
[facility] PROGRESS NOTE    Progress Note  Jennifer Molina  [id number] DOB: 1956/07/10 DOA: 07/21/2019 PCP: Mack Hook, MD     Brief Narrative:   Jennifer Molina is an 63 y.o. female past medical history of diabetes mellitus type 2, essential hypertension was brought in the ED as he was having flulike symptoms nausea vomiting and diarrhea for 4 days prior to admission.  Over the last couple of days he has developed mild shortness of breath in the ER he was found to have a temperature 100.2, tachycardia, hypoxic and tachypneic, chest x-ray showed bilateral infiltrate SARS-CoV-2 PCR was positive.  He was transferred to [facility].  Assessment/Plan:   Sepsis/acute respiratory failure with hypoxia due to Pneumonia due to COVID-19 virus This morning Jennifer Molina still requiring 12 L of high flow nasal cannula to keep saturations greater than 90%. She received convalescent plasma on 07/23/2019, Actemra 07/24/2019. Continue IV remdesivir, IV steroids vitamin C and zinc. Her inflammatory markers have improved. She relates her breathing is unchanged compared to yesterday. continue subtherapeutic dose of Lovenox.  DM (diabetes mellitus), type 2, uncontrolled ([facility]) A1c of 8.9, continue long-acting insulin plus sliding scale. Blood glucose is fairly controlled.  Essential  Hypertension Seems to be well controlled, continue Norvasc and metoprolol.   DVT prophylaxis: lovenox Family Communication:none Disposition Plan/Barrier to D/C: unable to determine Code Status:     Code Status Orders  (From admission, onward)         Start     Ordered   07/22/19 0441  Full code  Continuous     07/22/19 0441        Code Status History    This patient has a current code status but no historical code status.   Advance Care Planning Activity        IV Access:    Peripheral IV   Procedures and diagnostic studies:   No results found.   Medical Consultants:    None.   Anti-Infectives:   IV remdesivir  Subjective:    Jennifer Molina she relates she feels , tired,his breathing is about the same as yesterday.  Objective:    Vitals:   07/24/19 2324 07/24/19 2325 07/25/19 0337 07/25/19 0705  BP:   (!) 145/83 (!) 126/108  Pulse:      Resp: (!) 28 (!) 25 (!) 22 (!) 24  Temp:   98.4 F (36.9 C) 98.5 F (36.9 C)  TempSrc:   Oral Oral  SpO2: 90% (!) 72% 91% 97%  Weight:      Height:       SpO2: 97 % O2 Flow Rate (L/min): 15 L/min   Intake/Output Summary (Last 24 hours) at 07/25/2019 0801 Last data filed at 07/25/2019 0400 Gross per 24 hour  Intake 320 ml  Output -  Net 320 ml   Filed Weights   07/22/19 0008 07/23/19 0500 07/24/19 0500  Weight: 68 kg 66 kg 65.9 kg    Exam: General exam: In no acute distress. Respiratory system: Good air movement and diffuse crackles bilaterally. Cardiovascular system: S1 & S2 heard, RRR. No JVD. Gastrointestinal system: Abdomen is nondistended, soft and nontender.  Central nervous system: Alert and oriented. No focal neurological deficits. Extremities: No pedal edema. Skin: No rashes, lesions or ulcers Psychiatry: Judgement and insight appear normal. Mood & affect appropriate.    Data Reviewed:    Labs: Basic Metabolic Panel: Recent Labs  Lab 07/21/19 1905 07/22/19 0530 07/23/19 0224 07/25/19 0412  NA 130* 129* 133*  134*  K 3.6 4.2 4.2 4.8  CL 98 99 100 99  CO2 22 20* 22 23  GLUCOSE 311* 296* 239* 262*  BUN 11 11 22  34*  CREATININE 0.94 0.81 0.78 0.93  CALCIUM 8.5* 8.1* 8.6* 8.9  MG  --   --  1.7  --   PHOS  --   --  3.4  --    GFR Estimated Creatinine Clearance: 52.4 mL/min (by C-G formula based on SCr of 0.93 mg/dL). Liver Function Tests: Recent Labs  Lab 07/21/19 1905 07/22/19 0530 07/23/19 0224 07/25/19 0412  AST 59* 52* 79* 61*  ALT 38 34 50* 72*  ALKPHOS 36* 37* 37* 46  BILITOT 0.5 0.6 0.3 0.2*  PROT 7.5 7.3 7.2 7.1  ALBUMIN 2.9* 2.7* 2.7* 2.7*   Recent Labs  Lab  07/21/19 1905  LIPASE 34   No results for input(s): AMMONIA in the last 168 hours. Coagulation profile No results for input(s): INR, PROTIME in the last 168 hours. COVID-19 Labs  Recent Labs    07/23/19 0224 07/24/19 0350 07/25/19 0055  DDIMER 1.28* 1.36* 1.42*  CRP 5.5* 1.9* 1.2*    No results found for: SARSCOV2NAA  CBC: Recent Labs  Lab 07/21/19 1905 07/22/19 0530 07/23/19 0224 07/25/19 0412  WBC 6.0 5.1 6.1 10.6*  NEUTROABS  --  4.0 4.8 9.0*  HGB 10.6* 10.1* 10.4* 10.7*  HCT 32.2* 31.0* 31.3* 32.7*  MCV 78.7* 78.1* 77.7* 79.0*  PLT 197 214 266 364   Cardiac Enzymes: No results for input(s): CKTOTAL, CKMB, CKMBINDEX, TROPONINI in the last 168 hours. BNP (last 3 results) No results for input(s): PROBNP in the last 8760 hours. CBG: Recent Labs  Lab 07/24/19 1120 07/24/19 1200 07/24/19 1624 07/24/19 1944 07/25/19 0704  GLUCAP 387* 348* 331* 404* 193*   D-Dimer: Recent Labs    07/24/19 0350 07/25/19 0055  DDIMER 1.36* 1.42*   Hgb A1c: No results for input(s): HGBA1C in the last 72 hours. Lipid Profile: No results for input(s): CHOL, HDL, LDLCALC, TRIG, CHOLHDL, LDLDIRECT in the last 72 hours. Thyroid function studies: No results for input(s): TSH, T4TOTAL, T3FREE, THYROIDAB in the last 72 hours.  Invalid input(s): FREET3 Anemia work up: No results for input(s): VITAMINB12, FOLATE, FERRITIN, TIBC, IRON, RETICCTPCT in the last 72 hours. Sepsis Labs: Recent Labs  Lab 07/21/19 1905 07/22/19 0530 07/23/19 0224 07/24/19 0350 07/25/19 0412  PROCALCITON  --   --  0.20 0.13  --   WBC 6.0 5.1 6.1  --  10.6*  LATICACIDVEN 1.9  --   --   --   --    Microbiology No results found for this or any previous visit (from the past 240 hour(s)).   Medications:   . amLODipine  5 mg Oral Daily  . aspirin EC  81 mg Oral Daily  . clotrimazole   Topical BID  . dexamethasone (DECADRON) injection  6 mg Intravenous Q12H  . enoxaparin (LOVENOX) injection  40 mg  Subcutaneous Q24H  . insulin aspart  0-5 Units Subcutaneous QHS  . insulin aspart  0-9 Units Subcutaneous TID WC  . insulin aspart  3 Units Subcutaneous TID WC  . insulin detemir  30 Units Subcutaneous BID  . metoprolol tartrate  100 mg Oral BID   Continuous Infusions: . remdesivir 100 mg in NS 100 mL Stopped (07/24/19 1009)      LOS: 3 days   Charlynne Cousins  [facility]  07/25/2019, 8:01 AM

## 2019-07-25 NOTE — Progress Notes (Signed)
Results for ALEXSIA, DEFRANCESCO (MRN [id number]) as of 07/25/2019 12:19  Ref. Range 07/24/2019 12:00 07/24/2019 16:24 07/24/2019 19:44 07/25/2019 07:04 07/25/2019 11:32  Glucose-Capillary Latest Ref Range: 70 - 99 mg/dL 348 (H) 331 (H) 404 (H) 193 (H) 372 (H)  Noted that postprandial blood sugars are greater than 300 mg/dl.   Recommend increasing Novolog correction scale to RESISTANT TID and HS scale if blood sugars continue to be elevated.   Will continue to monitor blood sugars while in the hospital.  Harvel Ricks RN BSN CDE Diabetes Coordinator Pager: [phone]  8am-5pm

## 2019-07-26 LAB — GLUCOSE, CAPILLARY
Glucose-Capillary: 169 mg/dL — ABNORMAL HIGH (ref 70–99)
Glucose-Capillary: 203 mg/dL — ABNORMAL HIGH (ref 70–99)
Glucose-Capillary: 221 mg/dL — ABNORMAL HIGH (ref 70–99)
Glucose-Capillary: 397 mg/dL — ABNORMAL HIGH (ref 70–99)
Glucose-Capillary: 293 mg/dL — ABNORMAL HIGH (ref 70–99)

## 2019-07-26 LAB — D-DIMER, QUANTITATIVE: D-Dimer, Quant: 2.43 ug/mL-FEU — ABNORMAL HIGH (ref 0.00–0.50)

## 2019-07-26 LAB — C-REACTIVE PROTEIN: CRP: 0.9 mg/dL (ref <1.0)

## 2019-07-26 MED ORDER — INSULIN ASPART 100 UNIT/ML ~~LOC~~ SOLN
8.0000 [IU] | Freq: Three times a day (TID) | SUBCUTANEOUS | Status: DC
  Administered 2019-07-26 – 2019-07-29 (×9): 8 [IU] via SUBCUTANEOUS

## 2019-07-26 MED ORDER — INSULIN ASPART 100 UNIT/ML ~~LOC~~ SOLN: 6.0000 [IU] | Freq: Three times a day (TID) | SUBCUTANEOUS | Status: DC

## 2019-07-26 NOTE — Plan of Care (Signed)

## 2019-07-26 NOTE — Progress Notes (Signed)
   07/26/19 1625  Provider Notification  Notification Reason Other (Comment) (Possible urine retention, bladder scan out of order.)

## 2019-07-26 NOTE — Plan of Care (Signed)
  Problem: Nutrition: Goal: Adequate nutrition will be maintained Outcome: Not Progressing Note: Poor appetite. PO encouraged.

## 2019-07-26 NOTE — Progress Notes (Signed)
   07/26/19 1400  Family/Significant Other Communication  Family/Significant Other Update Called;Updated (Call to daughter Lexine Baton)  Daughter translated for patient. Updated on condition. Unable to hear on Translator services.

## 2019-07-26 NOTE — Plan of Care (Signed)
  Problem: Clinical Measurements: Goal: Will remain free from infection 07/26/2019 2106 by Gaynell Face, RN Outcome: Progressing 07/26/2019 2104 by Gaynell Face, RN Outcome: Progressing 07/26/2019 2103 by Gaynell Face, RN Outcome: Progressing   Problem: Clinical Measurements: Goal: Diagnostic test results will improve 07/26/2019 2106 by Gaynell Face, RN Outcome: Progressing 07/26/2019 2104 by Gaynell Face, RN Outcome: Progressing 07/26/2019 2103 by Gaynell Face, RN Outcome: Progressing   Problem: Clinical Measurements: Goal: Respiratory complications will improve 07/26/2019 2106 by Gaynell Face, RN Outcome: Progressing 07/26/2019 2104 by Gaynell Face, RN Outcome: Progressing 07/26/2019 2103 by Gaynell Face, RN Outcome: Progressing   Problem: Activity: Goal: Risk for activity intolerance will decrease 07/26/2019 2106 by Gaynell Face, RN Outcome: Progressing 07/26/2019 2104 by Gaynell Face, RN Outcome: Progressing 07/26/2019 2103 by Gaynell Face, RN Outcome: Progressing   Problem: Pain Managment: Goal: General experience of comfort will improve 07/26/2019 2106 by Gaynell Face, RN Outcome: Progressing 07/26/2019 2104 by Gaynell Face, RN Outcome: Progressing 07/26/2019 2103 by Gaynell Face, RN Outcome: Progressing

## 2019-07-26 NOTE — Progress Notes (Signed)
   07/26/19 1919  RN Transport Assessment (Complete at Beginning of Shift)  RN to accompany transport? No  Transport method Wheelchair  Oxygen needed for transport? Yes  Does IV pole need to accompany transport? No  COVID-19 Universal Masking   Universal Masking-Does patient have mask on (ED/Ambulatory) / is patient wearing mask when staff enters room or during transport (Inpatient)? No  Mask contraindicated/refused SOB  Hand-Off documentation  Handoff Given Given to shift RN/LPN  Report given to (Full Name) Gaynell Face, RN  Report received from (Full Name) Tenna Delaine, RN  Check in  Check in process completed (see row info) Yes

## 2019-07-26 NOTE — Progress Notes (Signed)
Inpatient Diabetes Program Recommendations  AACE/ADA: New Consensus Statement on Inpatient Glycemic Control (2015)  Target Ranges:  Prepandial:   less than 140 mg/dL      Peak postprandial:   less than 180 mg/dL (1-2 hours)      Critically ill patients:  140 - 180 mg/dL   Lab Results  Component Value Date   GLUCAP 221 (H) 07/26/2019   HGBA1C 8.9 (H) 07/22/2019    Review of Glycemic Control Results for Jennifer Molina, Jennifer Molina (MRN [id number]) as of 07/26/2019 13:31  Ref. Range 07/25/2019 16:39 07/25/2019 19:23 07/26/2019 07:37 07/26/2019 08:00 07/26/2019 11:19  Glucose-Capillary Latest Ref Range: 70 - 99 mg/dL 292 (H) 382 (H) 169 (H) 203 (H) 221 (H)    Inpatient Diabetes Program Recommendations:   -Increase Novolog meal coverage to 6 units tid if eats 50%  Thank you, Bethena Roys E. [place] Doig, RN, MSN, CDE  Diabetes Coordinator Inpatient Glycemic Control Team Team Pager [phone] (8am-5pm) 07/26/2019 1:32 PM

## 2019-07-26 NOTE — Progress Notes (Signed)
[facility] PROGRESS NOTE    Progress Note  [place] Jennifer Molina  [id number] DOB: 06/09/1956 DOA: 07/21/2019 PCP: Mack Hook, MD     Brief Narrative:   Jennifer Molina is an 63 y.o. female past medical history of diabetes mellitus type 2, essential hypertension was brought in the ED as he was having flulike symptoms nausea vomiting and diarrhea for 4 days prior to admission.  Over the last couple of days he has developed mild shortness of breath in the ER he was found to have a temperature 100.2, tachycardia, hypoxic and tachypneic, chest x-ray showed bilateral infiltrate SARS-CoV-2 PCR was positive.  He was transferred to [facility].  SARS-CoV-2 medications: Convalescent plasma 07/23/2019 Actemra   07/24/2019 Dexamethasone  07/21/2019 Remdesivir   07/21/2019  Assessment/Plan:   Sepsis/acute respiratory failure with hypoxia due to Pneumonia due to COVID-19 virus This morning [name] is down to 6 L of high flow nasal cannula to keep saturations greater than 90%. She is currently on IV remdesivir, steroids vitamin C and zinc, she did receive Actemra and convalescent plasma. Her inflammatory markers this morning are significantly improved. Her oxygen requirements have significantly decreased. She relates her breathing is significantly improved compared to previous days. Try to keep the patient prone for early 16 hours a day and would not prone out of bed to chair. Continue prophylactic Lovenox  DM (diabetes mellitus), type 2, uncontrolled (HCC) Hemoglobin A1c is 8.9, she is currently on long-acting insulin plus sliding scale her blood glucose is fairly controlled. Fasting blood glucose this morning was 169.  Essential  Hypertension Seems to be well controlled, continue Norvasc and metoprolol.   DVT prophylaxis: lovenox Family Communication:none Disposition Plan/Barrier to D/C: unable to determine Code Status:     Code Status Orders  (From admission, onward)         Start      Ordered   07/22/19 0441  Full code  Continuous     07/22/19 0441        Code Status History    This patient has a current code status but no historical code status.   Advance Care Planning Activity        IV Access:    Peripheral IV   Procedures and diagnostic studies:   No results found.   Medical Consultants:    None.  Anti-Infectives:   IV remdesivir  Subjective:    [name] relates today she feels significantly better than yesterday, she relates that she could breathe better.  Objective:    Vitals:   07/26/19 0054 07/26/19 0500 07/26/19 0622 07/26/19 0729  BP: (!) 141/71  (!) 153/82   Pulse:      Resp: 15  19   Temp: 98.4 F (36.9 C)  (!) 97.5 F (36.4 C)   TempSrc: Oral  Oral   SpO2: 99%  93% 99%  Weight:  64 kg    Height:       SpO2: 99 % O2 Flow Rate (L/min): 6 L/min   Intake/Output Summary (Last 24 hours) at 07/26/2019 0811 Last data filed at 07/25/2019 1335 Gross per 24 hour  Intake 360 ml  Output -  Net 360 ml   Filed Weights   07/23/19 0500 07/24/19 0500 07/26/19 0500  Weight: 66 kg 65.9 kg 64 kg    Exam: General exam: In no acute distress. Respiratory system: Good air movement and diffuse crackles bilaterally. Cardiovascular system: S1 & S2 heard, RRR. No JVD. Gastrointestinal system: Abdomen is nondistended, soft and nontender.  Central nervous system: Alert and oriented. No focal neurological deficits. Extremities: No pedal edema. Skin: No rashes, lesions or ulcers  Data Reviewed:    Labs: Basic Metabolic Panel: Recent Labs  Lab 07/21/19 1905 07/22/19 0530 07/23/19 0224 07/25/19 0412  NA 130* 129* 133* 134*  K 3.6 4.2 4.2 4.8  CL 98 99 100 99  CO2 22 20* 22 23  GLUCOSE 311* 296* 239* 262*  BUN 11 11 22  34*  CREATININE 0.94 0.81 0.78 0.93  CALCIUM 8.5* 8.1* 8.6* 8.9  MG  --   --  1.7  --   PHOS  --   --  3.4  --    GFR Estimated Creatinine Clearance: 51.7 mL/min (by C-G formula based on SCr of 0.93  mg/dL). Liver Function Tests: Recent Labs  Lab 07/21/19 1905 07/22/19 0530 07/23/19 0224 07/25/19 0412  AST 59* 52* 79* 61*  ALT 38 34 50* 72*  ALKPHOS 36* 37* 37* 46  BILITOT 0.5 0.6 0.3 0.2*  PROT 7.5 7.3 7.2 7.1  ALBUMIN 2.9* 2.7* 2.7* 2.7*   Recent Labs  Lab 07/21/19 1905  LIPASE 34   No results for input(s): AMMONIA in the last 168 hours. Coagulation profile No results for input(s): INR, PROTIME in the last 168 hours. COVID-19 Labs  Recent Labs    07/24/19 0350 07/25/19 0055 07/26/19 0255  DDIMER 1.36* 1.42* 2.43*  CRP 1.9* 1.2* 0.9    No results found for: SARSCOV2NAA  CBC: Recent Labs  Lab 07/21/19 1905 07/22/19 0530 07/23/19 0224 07/25/19 0412  WBC 6.0 5.1 6.1 10.6*  NEUTROABS  --  4.0 4.8 9.0*  HGB 10.6* 10.1* 10.4* 10.7*  HCT 32.2* 31.0* 31.3* 32.7*  MCV 78.7* 78.1* 77.7* 79.0*  PLT 197 214 266 364   Cardiac Enzymes: No results for input(s): CKTOTAL, CKMB, CKMBINDEX, TROPONINI in the last 168 hours. BNP (last 3 results) No results for input(s): PROBNP in the last 8760 hours. CBG: Recent Labs  Lab 07/25/19 0704 07/25/19 1132 07/25/19 1639 07/25/19 1923 07/26/19 0737  GLUCAP 193* 372* 292* 382* 169*   D-Dimer: Recent Labs    07/25/19 0055 07/26/19 0255  DDIMER 1.42* 2.43*   Hgb A1c: No results for input(s): HGBA1C in the last 72 hours. Lipid Profile: No results for input(s): CHOL, HDL, LDLCALC, TRIG, CHOLHDL, LDLDIRECT in the last 72 hours. Thyroid function studies: No results for input(s): TSH, T4TOTAL, T3FREE, THYROIDAB in the last 72 hours.  Invalid input(s): FREET3 Anemia work up: No results for input(s): VITAMINB12, FOLATE, FERRITIN, TIBC, IRON, RETICCTPCT in the last 72 hours. Sepsis Labs: Recent Labs  Lab 07/21/19 1905 07/22/19 0530 07/23/19 0224 07/24/19 0350 07/25/19 0412  PROCALCITON  --   --  0.20 0.13  --   WBC 6.0 5.1 6.1  --  10.6*  LATICACIDVEN 1.9  --   --   --   --    Microbiology No results found  for this or any previous visit (from the past 240 hour(s)).   Medications:   . amLODipine  5 mg Oral Daily  . aspirin EC  81 mg Oral Daily  . clotrimazole   Topical BID  . dexamethasone (DECADRON) injection  6 mg Intravenous Q12H  . enoxaparin (LOVENOX) injection  40 mg Subcutaneous Q24H  . insulin aspart  0-5 Units Subcutaneous QHS  . insulin aspart  0-9 Units Subcutaneous TID WC  . insulin aspart  3 Units Subcutaneous TID WC  . insulin detemir  30 Units Subcutaneous BID  .  metoprolol tartrate  100 mg Oral BID   Continuous Infusions: . remdesivir 100 mg in NS 100 mL 100 mg (07/25/19 0803)      LOS: 4 days   Charlynne Cousins  [facility]  07/26/2019, 8:10 AM

## 2019-07-27 ENCOUNTER — Inpatient Hospital Stay (HOSPITAL_COMMUNITY): Payer: Medicaid Other

## 2019-07-27 LAB — C-REACTIVE PROTEIN: CRP: 0.7 mg/dL (ref <1.0)

## 2019-07-27 LAB — GLUCOSE, CAPILLARY
Glucose-Capillary: 189 mg/dL — ABNORMAL HIGH (ref 70–99)
Glucose-Capillary: 320 mg/dL — ABNORMAL HIGH (ref 70–99)
Glucose-Capillary: 330 mg/dL — ABNORMAL HIGH (ref 70–99)
Glucose-Capillary: 283 mg/dL — ABNORMAL HIGH (ref 70–99)

## 2019-07-27 LAB — D-DIMER, QUANTITATIVE: D-Dimer, Quant: 3.05 ug/mL-FEU — ABNORMAL HIGH (ref 0.00–0.50)

## 2019-07-27 MED ORDER — POLYETHYLENE GLYCOL 3350 17 G PO PACK
17.0000 g | PACK | Freq: Every day | ORAL | Status: DC
  Administered 2019-07-27 – 2019-07-29 (×3): 17 g via ORAL
  Filled 2019-07-27 (×2): qty 1

## 2019-07-27 MED ORDER — IOHEXOL 350 MG/ML SOLN
75.0000 mL | Freq: Once | INTRAVENOUS | Status: AC | PRN
  Administered 2019-07-27: 75 mL via INTRAVENOUS

## 2019-07-27 NOTE — Progress Notes (Addendum)
Inpatient Diabetes Program Recommendations  AACE/ADA: New Consensus Statement on Inpatient Glycemic Control (2015)  Target Ranges:  Prepandial:   less than 140 mg/dL      Peak postprandial:   less than 180 mg/dL (1-2 hours)      Critically ill patients:  140 - 180 mg/dL   Lab Results  Component Value Date   GLUCAP 320 (H) 07/27/2019   HGBA1C 8.9 (H) 07/22/2019    Review of Glycemic Control Results for Jennifer Molina, Jennifer Molina (MRN [id number]) as of 07/27/2019 13:57  Ref. Range 07/26/2019 08:00 07/26/2019 11:19 07/26/2019 16:17 07/26/2019 21:41 07/27/2019 08:17 07/27/2019 11:46  Glucose-Capillary Latest Ref Range: 70 - 99 mg/dL 203 (H) 221 (H) 397 (H) 293 (H) 189 (H) 320 (H)   Diabetes history: DM 2 Outpatient Diabetes medications: Amaryl 1 mg daily, Metformin 1000 mg bid Current orders for Inpatient glycemic control:  Levemir 30 units bid Novolog 8 units tid with meals Novolog sensitive tid with meals and HS Decadron 6 mg bid Inpatient Diabetes Program Recommendations:    Note increased insulin needs with steroids.  A1c indicates that average blood sugars approximately 207 mg/dL. Could possibly increase Amaryl dose at d/c and avoid insulin if steroids not continued at home?  Will attempt to call patient's family today to discuss.  Thanks  Adah Perl, RN, BC-ADM Inpatient Diabetes Coordinator Pager [phone] (8a-5p)  12/9- Addendum: spoke briefly to daughter who cares for mother.  She states that she does take her oral medications for DM at home. They have a meter and check is occasionally.  Explained current A1C=8.9% (last A1C=8.3%). Would not recommend starting insulin on this patient at home and instead may increase oral agents.  Discussed hypoglycemia signs, symptoms and treatment with daughter.  She states that they always keep juice on hand in case blood sugar drops.  Daughter concerned about her mother. Emotional support provided.

## 2019-07-27 NOTE — Plan of Care (Signed)

## 2019-07-27 NOTE — Progress Notes (Signed)
[facility] PROGRESS NOTE    Progress Note  Jennifer Molina  [id number] DOB: 1956/05/19 DOA: 07/21/2019 PCP: Mack Hook, MD     Brief Narrative:   Jennifer Molina is an 63 y.o. female past medical history of diabetes mellitus type 2, essential hypertension was brought to the ER with flulike symptoms, nausea ,vomiting and diarrhea for 4 days prior to admission.  Over the last couple of days she has developed mild shortness of breath.  In the emergency room, she was found with a temperature 100.2, tachycardia, hypoxic and tachypneic, chest x-ray showed bilateral infiltrate SARS-CoV-2 PCR was positive.  She was [facility].  SARS-CoV-2 medications: Convalescent plasma 07/23/2019 Actemra   07/24/2019 Dexamethasone  07/21/2019 Remdesivir   07/21/2019   Assessment/Plan:   Sepsis/acute respiratory failure with hypoxia due to Pneumonia due to COVID-19 virus: Continue to monitor due to significant symptoms of hypoxia. chest physiotherapy, incentive spirometry, deep breathing exercises, sputum induction, mucolytic's and bronchodilators. Supplemental oxygen to keep saturations more than 90%. Covid directed therapy with , steroids, on dexamethasone day 8 of 10 remdesivir, finished 5 days of therapy on 07/26/2019 actemra, received on 07/24/2019 convalescent plasma, received on 07/23/2019 antibiotics, not indicated. Still with oxygen requirement.  Continue chest physiotherapy. Her other enzyme markers are improving, D-dimer slightly elevated.  Patient is on prophylactic Lovenox.  Patient with persistent oxygen requirement, elevated D-dimer.  Will check CTA of the chest.   DM (diabetes mellitus), type 2, uncontrolled ([place]) Hemoglobin A1c is 8.9, she is currently on long-acting insulin plus sliding scale her blood glucose is fairly controlled. Patient will go home on increased dose of oral hypoglycemics.  Essential  Hypertension Seems to be well controlled, continue Norvasc and  metoprolol.   DVT prophylaxis: lovenox subcu. Family Communication: Disposition Plan/Barrier to D/C: Still on 6 L oxygen. Code Status:     Code Status Orders  (From admission, onward)         Start     Ordered   07/22/19 0441  Full code  Continuous     07/22/19 0441        Code Status History    This patient has a current code status but no historical code status.   Advance Care Planning Activity        IV Access:    Peripheral IV   Procedures and diagnostic studies:   No results found.   Medical Consultants:    None.  Anti-Infectives:   IV remdesivir finished therapy.  Subjective:   Patient seen and examined.  No overnight events.  Through her daughter she relate that she is constipated and has some abdominal pain. No overnight events.  Still remains on significant amount of oxygen.  Has some dry cough.  No sputum production. Objective:    Vitals:   07/27/19 0416 07/27/19 0420 07/27/19 0500 07/27/19 0800  BP: (!) 144/91   137/89  Pulse: 72   77  Resp: (!) 24 20  16   Temp: 98.1 F (36.7 C)   98.1 F (36.7 C)  TempSrc: Axillary   Axillary  SpO2: 96% 98%  98%  Weight:   64.6 kg   Height:       SpO2: 98 % O2 Flow Rate (L/min): 6 L/min   Intake/Output Summary (Last 24 hours) at 07/27/2019 1423 Last data filed at 07/27/2019 0800 Gross per 24 hour  Intake 120 ml  Output 2700 ml  Net -2580 ml   Filed Weights   07/24/19 0500 07/26/19 0500 07/27/19 0500  Weight: 65.9 kg 64 kg 64.6 kg    Exam: General exam: In no acute distress.  On 6 L oxygen. Respiratory system: Good air movement and diffuse fine crackles bilaterally. Cardiovascular system: S1 & S2 heard, RRR. No JVD. Gastrointestinal system: Abdomen is nondistended, soft and nontender.  Central nervous system: Alert and oriented. No focal neurological deficits. Extremities: No pedal edema. Skin: No rashes, lesions or ulcers  Data Reviewed:    Labs: Basic Metabolic Panel: Recent  Labs  Lab 07/21/19 1905 07/22/19 0530 07/23/19 0224 07/25/19 0412  NA 130* 129* 133* 134*  K 3.6 4.2 4.2 4.8  CL 98 99 100 99  CO2 22 20* 22 23  GLUCOSE 311* 296* 239* 262*  BUN 11 11 22  34*  CREATININE 0.94 0.81 0.78 0.93  CALCIUM 8.5* 8.1* 8.6* 8.9  MG  --   --  1.7  --   PHOS  --   --  3.4  --    GFR Estimated Creatinine Clearance: 51.9 mL/min (by C-G formula based on SCr of 0.93 mg/dL). Liver Function Tests: Recent Labs  Lab 07/21/19 1905 07/22/19 0530 07/23/19 0224 07/25/19 0412  AST 59* 52* 79* 61*  ALT 38 34 50* 72*  ALKPHOS 36* 37* 37* 46  BILITOT 0.5 0.6 0.3 0.2*  PROT 7.5 7.3 7.2 7.1  ALBUMIN 2.9* 2.7* 2.7* 2.7*   Recent Labs  Lab 07/21/19 1905  LIPASE 34   No results for input(s): AMMONIA in the last 168 hours. Coagulation profile No results for input(s): INR, PROTIME in the last 168 hours. COVID-19 Labs  Recent Labs    07/25/19 0055 07/26/19 0255 07/27/19 0515  DDIMER 1.42* 2.43* 3.05*  CRP 1.2* 0.9 0.7    No results found for: SARSCOV2NAA  CBC: Recent Labs  Lab 07/21/19 1905 07/22/19 0530 07/23/19 0224 07/25/19 0412  WBC 6.0 5.1 6.1 10.6*  NEUTROABS  --  4.0 4.8 9.0*  HGB 10.6* 10.1* 10.4* 10.7*  HCT 32.2* 31.0* 31.3* 32.7*  MCV 78.7* 78.1* 77.7* 79.0*  PLT 197 214 266 364   Cardiac Enzymes: No results for input(s): CKTOTAL, CKMB, CKMBINDEX, TROPONINI in the last 168 hours. BNP (last 3 results) No results for input(s): PROBNP in the last 8760 hours. CBG: Recent Labs  Lab 07/26/19 1119 07/26/19 1617 07/26/19 2141 07/27/19 0817 07/27/19 1146  GLUCAP 221* 397* 293* 189* 320*   D-Dimer: Recent Labs    07/26/19 0255 07/27/19 0515  DDIMER 2.43* 3.05*   Hgb A1c: No results for input(s): HGBA1C in the last 72 hours. Lipid Profile: No results for input(s): CHOL, HDL, LDLCALC, TRIG, CHOLHDL, LDLDIRECT in the last 72 hours. Thyroid function studies: No results for input(s): TSH, T4TOTAL, T3FREE, THYROIDAB in the last 72  hours.  Invalid input(s): FREET3 Anemia work up: No results for input(s): VITAMINB12, FOLATE, FERRITIN, TIBC, IRON, RETICCTPCT in the last 72 hours. Sepsis Labs: Recent Labs  Lab 07/21/19 1905 07/22/19 0530 07/23/19 0224 07/24/19 0350 07/25/19 0412  PROCALCITON  --   --  0.20 0.13  --   WBC 6.0 5.1 6.1  --  10.6*  LATICACIDVEN 1.9  --   --   --   --    Microbiology No results found for this or any previous visit (from the past 240 hour(s)).   Medications:   . amLODipine  5 mg Oral Daily  . aspirin EC  81 mg Oral Daily  . clotrimazole   Topical BID  . dexamethasone (DECADRON) injection  6 mg Intravenous Q12H  .  enoxaparin (LOVENOX) injection  40 mg Subcutaneous Q24H  . insulin aspart  0-5 Units Subcutaneous QHS  . insulin aspart  0-9 Units Subcutaneous TID WC  . insulin aspart  8 Units Subcutaneous TID WC  . insulin detemir  30 Units Subcutaneous BID  . metoprolol tartrate  100 mg Oral BID   Continuous Infusions:     LOS: 5 days   Hezekiah Veltre  [facility]  07/27/2019, 2:23 PM

## 2019-07-27 NOTE — Progress Notes (Signed)
   07/27/19 1521  Family/Significant Other Communication  Family/Significant Other Update Called;Updated (daughter Salida)

## 2019-07-27 NOTE — Plan of Care (Signed)

## 2019-07-28 LAB — GLUCOSE, CAPILLARY
Glucose-Capillary: 214 mg/dL — ABNORMAL HIGH (ref 70–99)
Glucose-Capillary: 380 mg/dL — ABNORMAL HIGH (ref 70–99)
Glucose-Capillary: 277 mg/dL — ABNORMAL HIGH (ref 70–99)
Glucose-Capillary: 375 mg/dL — ABNORMAL HIGH (ref 70–99)
Glucose-Capillary: 365 mg/dL — ABNORMAL HIGH (ref 70–99)

## 2019-07-28 NOTE — Progress Notes (Signed)
Inpatient Diabetes Program Recommendations  AACE/ADA: New Consensus Statement on Inpatient Glycemic Control (2015)  Target Ranges:  Prepandial:   less than 140 mg/dL      Peak postprandial:   less than 180 mg/dL (1-2 hours)      Critically ill patients:  140 - 180 mg/dL   Lab Results  Component Value Date   GLUCAP 214 (H) 07/28/2019   HGBA1C 8.9 (H) 07/22/2019    Review of Glycemic Control Results for Jennifer Molina, Jennifer Molina (MRN [id number]) as of 07/28/2019 11:51  Ref. Range 07/27/2019 11:46 07/27/2019 15:57 07/27/2019 20:35 07/28/2019 07:44  Glucose-Capillary Latest Ref Range: 70 - 99 mg/dL 320 (H) 330 (H) 283 (H) 214 (H)   Diabetes history: DM 2 Outpatient Diabetes medications: Amaryl 1 mg daily, Metformin 1000 mg bid Current orders for Inpatient glycemic control:  Levemir 30 units bid Novolog 8 units tid with meals Novolog sensitive tid with meals and HS Decadron 6 mg bid Inpatient Diabetes Program Recommendations:    Recommend increasing Novolog correction scale to RESISTANT TID and increasing meal coverage to Novolog 10 units TID if blood sugars continue to be elevated.   Thanks, Bronson Curb, MSN, RNC-OB Diabetes Coordinator [phone] (8a-5p)

## 2019-07-28 NOTE — Progress Notes (Signed)
   07/28/19 1744  Family/Significant Other Communication  Family/Significant Other Update Called;Updated (updated daughter Lexine Baton)

## 2019-07-28 NOTE — Progress Notes (Signed)
[facility] PROGRESS NOTE    Progress Note  Jennifer Molina  [id number] DOB: 11/21/1955 DOA: 07/21/2019 PCP: Mack Hook, MD     Brief Narrative:   Jennifer Molina is an 63 y.o. female past medical history of diabetes mellitus type 2, essential hypertension was brought to the ER with flulike symptoms, nausea ,vomiting and diarrhea for 4 days prior to admission.  Over the last couple of days she has developed mild shortness of breath.  In the emergency room, she was found with a temperature 100.2, tachycardia, hypoxic and tachypneic, chest x-ray showed bilateral infiltrate SARS-CoV-2 PCR was positive.  She was transferred to [facility].  SARS-CoV-2 medications: Convalescent plasma 07/23/2019 Actemra   07/24/2019 Dexamethasone  07/21/2019 Remdesivir   07/21/2019   Assessment/Plan:   Sepsis present on admission/acute respiratory failure with hypoxia due to Pneumonia due to COVID-19 virus: Clinically improving.  Still requiring supplemental oxygen. chest physiotherapy, incentive spirometry, deep breathing exercises, sputum induction, mucolytic's and bronchodilators. Supplemental oxygen to keep saturations more than 90%. Covid directed therapy with , steroids, on dexamethasone day 9 of 10 remdesivir, finished 5 days of therapy on 07/26/2019 actemra, received on 07/24/2019 convalescent plasma, received on 07/23/2019 antibiotics, not indicated. Still with oxygen requirement.  Continue chest physiotherapy. Repeat CT of the chest shows multifocal pneumonia, no evidence of pulmonary embolism.  DM (diabetes mellitus), type 2, uncontrolled ([place]) Hemoglobin A1c is 8.9, she is currently on long-acting insulin plus sliding scale her blood glucose is fairly controlled. Patient will go home on increased doses of oral hypoglycemics.  She will not be able to do insulin at home. She will finish the steroid therapy, hopefully this will help with control.   Essential  Hypertension Seems to be well  controlled, continue Norvasc and metoprolol.  Advance activities.  Mobilize with oxygen and monitor oxygen need. If patient continues to need oxygen and able to maintain on less than 3 L, will discharge patient home with oxygen.   DVT prophylaxis: lovenox subcu. Family Communication: Discussed earlier with patient's daughter, unable to discuss today. Disposition Plan/Barrier to D/C: Still on 3 to 4 L of oxygen. Code Status:     Code Status Orders  (From admission, onward)         Start     Ordered   07/22/19 0441  Full code  Continuous     07/22/19 0441        Code Status History    This patient has a current code status but no historical code status.   Advance Care Planning Activity        IV Access:    Peripheral IV   Procedures and diagnostic studies:   CT ANGIO CHEST PE W OR WO CONTRAST  Result Date: 07/27/2019 CLINICAL DATA:  Persistent hypoxia, COVID-19, elevated D-dimer. EXAM: CT ANGIOGRAPHY CHEST WITH CONTRAST TECHNIQUE: Multidetector CT imaging of the chest was performed using the standard protocol during bolus administration of intravenous contrast. Multiplanar CT image reconstructions and MIPs were obtained to evaluate the vascular anatomy. CONTRAST:  71mL OMNIPAQUE IOHEXOL 350 MG/ML SOLN COMPARISON:  None. FINDINGS: Cardiovascular: Image quality is degraded by respiratory motion, limiting the evaluation of segmental and subsegmental pulmonary arteries. No central, main or lobar pulmonary embolus. Atherosclerotic calcification of the aorta. Pulmonic trunk and heart are enlarged. Mediastinum/Nodes: No pathologically enlarged mediastinal, hilar or axillary lymph nodes. Esophagus is grossly unremarkable. Lungs/Pleura: Image quality is degraded by respiratory motion. Extensive patchy bilateral pulmonary parenchymal ground-glass with areas of consolidation. No pleural fluid. Airway is  unremarkable. Upper Abdomen: Liver is decreased in attenuation diffusely. Liver,  gallbladder and adrenal glands are unremarkable. Visualized portions of the kidneys, spleen, pancreas, stomach and bowel are grossly unremarkable. No upper abdominal adenopathy. Musculoskeletal: Degenerative changes in the spine. No worrisome lytic or sclerotic lesions. Review of the MIP images confirms the above findings. IMPRESSION: 1. Image quality is degraded by respiratory motion, limiting the evaluation of segmental and subsegmental pulmonary arteries. No central, main or lobar pulmonary embolus. 2. Extensive bilateral COVID pneumonia. 3. Hepatic steatosis. 4. Enlarged pulmonic trunk, indicative of pulmonary arterial hypertension. Electronically Signed   By: Lorin Picket M.D.   On: 07/27/2019 16:15     Medical Consultants:    None.  Anti-Infectives:   IV remdesivir finished therapy.  Subjective:   No overnight events.  Patient stated that her abdominal pain is gone after taking laxatives. Objective:    Vitals:   07/28/19 0401 07/28/19 0418 07/28/19 0715 07/28/19 0800  BP: (!) 159/76   (!) 154/76  Pulse: 74   73  Resp: 15   16  Temp: 98.1 F (36.7 C)   98.6 F (37 C)  TempSrc: Oral   Oral  SpO2: 96%  100% 92%  Weight:  63.5 kg    Height:       SpO2: 92 % O2 Flow Rate (L/min): 3 L/min   Intake/Output Summary (Last 24 hours) at 07/28/2019 1300 Last data filed at 07/28/2019 0400 Gross per 24 hour  Intake --  Output 1300 ml  Net -1300 ml   Filed Weights   07/26/19 0500 07/27/19 0500 07/28/19 0418  Weight: 64 kg 64.6 kg 63.5 kg    Exam: General exam: In no acute distress.  On 6 L oxygen. Respiratory system: Good air movement and diffuse fine crackles bilaterally. Cardiovascular system: S1 & S2 heard, RRR. No JVD. Gastrointestinal system: Abdomen is nondistended, soft and nontender.  Central nervous system: Alert and oriented. No focal neurological deficits. Extremities: No pedal edema. Skin: No rashes, lesions or ulcers  Data Reviewed:    Labs: Basic  Metabolic Panel: Recent Labs  Lab 07/21/19 1905 07/22/19 0530 07/23/19 0224 07/25/19 0412  NA 130* 129* 133* 134*  K 3.6 4.2 4.2 4.8  CL 98 99 100 99  CO2 22 20* 22 23  GLUCOSE 311* 296* 239* 262*  BUN 11 11 22  34*  CREATININE 0.94 0.81 0.78 0.93  CALCIUM 8.5* 8.1* 8.6* 8.9  MG  --   --  1.7  --   PHOS  --   --  3.4  --    GFR Estimated Creatinine Clearance: 51.5 mL/min (by C-G formula based on SCr of 0.93 mg/dL). Liver Function Tests: Recent Labs  Lab 07/21/19 1905 07/22/19 0530 07/23/19 0224 07/25/19 0412  AST 59* 52* 79* 61*  ALT 38 34 50* 72*  ALKPHOS 36* 37* 37* 46  BILITOT 0.5 0.6 0.3 0.2*  PROT 7.5 7.3 7.2 7.1  ALBUMIN 2.9* 2.7* 2.7* 2.7*   Recent Labs  Lab 07/21/19 1905  LIPASE 34   No results for input(s): AMMONIA in the last 168 hours. Coagulation profile No results for input(s): INR, PROTIME in the last 168 hours. COVID-19 Labs  Recent Labs    07/26/19 0255 07/27/19 0515  DDIMER 2.43* 3.05*  CRP 0.9 0.7    No results found for: SARSCOV2NAA  CBC: Recent Labs  Lab 07/21/19 1905 07/22/19 0530 07/23/19 0224 07/25/19 0412  WBC 6.0 5.1 6.1 10.6*  NEUTROABS  --  4.0 4.8 9.0*  HGB 10.6* 10.1* 10.4* 10.7*  HCT 32.2* 31.0* 31.3* 32.7*  MCV 78.7* 78.1* 77.7* 79.0*  PLT 197 214 266 364   Cardiac Enzymes: No results for input(s): CKTOTAL, CKMB, CKMBINDEX, TROPONINI in the last 168 hours. BNP (last 3 results) No results for input(s): PROBNP in the last 8760 hours. CBG: Recent Labs  Lab 07/27/19 1146 07/27/19 1557 07/27/19 2035 07/28/19 0744 07/28/19 1142  GLUCAP 320* 330* 283* 214* 380*   D-Dimer: Recent Labs    07/26/19 0255 07/27/19 0515  DDIMER 2.43* 3.05*   Hgb A1c: No results for input(s): HGBA1C in the last 72 hours. Lipid Profile: No results for input(s): CHOL, HDL, LDLCALC, TRIG, CHOLHDL, LDLDIRECT in the last 72 hours. Thyroid function studies: No results for input(s): TSH, T4TOTAL, T3FREE, THYROIDAB in the last 72  hours.  Invalid input(s): FREET3 Anemia work up: No results for input(s): VITAMINB12, FOLATE, FERRITIN, TIBC, IRON, RETICCTPCT in the last 72 hours. Sepsis Labs: Recent Labs  Lab 07/21/19 1905 07/22/19 0530 07/23/19 0224 07/24/19 0350 07/25/19 0412  PROCALCITON  --   --  0.20 0.13  --   WBC 6.0 5.1 6.1  --  10.6*  LATICACIDVEN 1.9  --   --   --   --    Microbiology No results found for this or any previous visit (from the past 240 hour(s)).   Medications:    amLODipine  5 mg Oral Daily   aspirin EC  81 mg Oral Daily   clotrimazole   Topical BID   dexamethasone (DECADRON) injection  6 mg Intravenous Q12H   enoxaparin (LOVENOX) injection  40 mg Subcutaneous Q24H   insulin aspart  0-5 Units Subcutaneous QHS   insulin aspart  0-9 Units Subcutaneous TID WC   insulin aspart  8 Units Subcutaneous TID WC   insulin detemir  30 Units Subcutaneous BID   metoprolol tartrate  100 mg Oral BID   polyethylene glycol  17 g Oral Daily   Continuous Infusions:  Total time spent: 25 minutes   LOS: 6 days   Barb Merino  [facility]  07/28/2019, 1:00 PM

## 2019-07-29 LAB — GLUCOSE, CAPILLARY
Glucose-Capillary: 125 mg/dL — ABNORMAL HIGH (ref 70–99)
Glucose-Capillary: 240 mg/dL — ABNORMAL HIGH (ref 70–99)

## 2019-07-29 MED ORDER — LOSARTAN POTASSIUM 25 MG PO TABS
25.0000 mg | ORAL_TABLET | Freq: Every day | ORAL | Status: DC
  Administered 2019-07-29: 25 mg via ORAL
  Filled 2019-07-29: qty 1

## 2019-07-29 MED ORDER — DEXAMETHASONE 4 MG PO TABS: 4.0000 mg | ORAL_TABLET | Freq: Every day | ORAL | 0 refills | Status: AC

## 2019-07-29 MED ORDER — GABAPENTIN 300 MG PO CAPS
300.0000 mg | ORAL_CAPSULE | Freq: Every day | ORAL | Status: DC
  Filled 2019-07-29: qty 1

## 2019-07-29 NOTE — Progress Notes (Signed)
SATURATION QUALIFICATIONS: (This note is used to comply with regulatory documentation for home oxygen)  Patient Saturations on Room Air at Rest = 99%  Patient Saturations on Room Air while Ambulating = 95%  Patient Saturations on 0 Liters of oxygen while Ambulating =88- 95%  Please briefly explain why patient needs home oxygen:

## 2019-07-29 NOTE — Discharge Summary (Signed)
Physician Discharge Summary  Jennifer Molina [id number] DOB: Jun 21, 1956 DOA: 07/21/2019  PCP: Mack Hook, MD  Admit date: 07/21/2019 Discharge date: 07/29/2019  Admitted From: Home. Disposition: Home.  Recommendations for Outpatient Follow-up:  1. Follow up with PCP in 1-2 weeks Will need very close monitoring of blood sugars at home and further adjustment of therapy.  Home Health: Not applicable Equipment/Devices: Not applicable  Discharge Condition: Stable CODE STATUS: Full code Diet recommendation: Low-carb diet  Discharge summary: Jennifer Molina is an 63 y.o. female with past medical history of diabetes mellitus type 2, essential hypertension was brought to the ER with flulike symptoms, nausea ,vomiting and diarrhea for 4 days prior to admission.  Over the last couple of days she has developed mild shortness of breath.  In the emergency room, she was found with a temperature 100.2, tachycardia, hypoxic and tachypneic, chest x-ray showed bilateral infiltrate SARS-CoV-2 PCR was positive.  She was transferred to [facility].  SARS-CoV-2 medications: Convalescent plasma 07/23/2019 Actemra                       07/24/2019 Dexamethasone          07/21/2019 Remdesivir                  07/21/2019   Patient was admitted to hospital and treated with aggressive intervention for her COVID-19 pneumonia.  She did very good clinical recovery.  He stayed in the hospital due to ongoing oxygen requirement and now able to wean off to room air.  CTA negative for PE.  Going home with 2 more days of dexamethasone.  Her blood sugar control remained a challenge.  She was also on high-dose of steroids making difficult to control blood sugars.  She was treated with insulin while in the hospital.  Hemoglobin A1c is 8.9. I discussed with patient and her daughter on the phone about poorly controlled diabetes that will probably get some improvement after tapering off the steroids, however she will still need further  increase in medications. Also offered insulin to go home, patient was very nervous to start using insulin and she did not have family member at bedside that made insulin education unreliable.  For the time being, she will resume Amaryl and Metformin.  I have discussed with her daughter to make an appointment in 1 to 2 weeks so both of them can learn insulin techniques and daughter can help at home.  Discharge Diagnoses:  Principal Problem:   Pneumonia due to COVID-19 virus Active Problems:   DM (diabetes mellitus), type 2, uncontrolled ([place])   Hypertension   Sepsis due to COVID-19 [facility])   Acute respiratory failure with hypoxia [facility])    Discharge Instructions  Discharge Instructions    Call MD for:  difficulty breathing, headache or visual disturbances   Complete by: As directed    Call MD for:  temperature >100.4   Complete by: As directed    Diet Carb Modified   Complete by: As directed    Discharge instructions   Complete by: As directed    Check your blood sugars at home and bring it to doctor's office on follow-up. He can use Tylenol and over-the-counter cough medications as needed.   Increase activity slowly   Complete by: As directed      Allergies as of 07/29/2019   No Known Allergies     Medication List    TAKE these medications   acetaminophen 500 MG tablet Commonly known as:  TYLENOL Take 1,000 mg by mouth every 6 (six) hours as needed for mild pain or fever.   amLODipine 5 MG tablet Commonly known as: NORVASC Take 1 tablet (5 mg total) by mouth daily.   Aspirin Low Dose 81 MG EC tablet Generic drug: aspirin Take 81 mg by mouth daily.   dexamethasone 4 MG tablet Commonly known as: DECADRON Take 1 tablet (4 mg total) by mouth daily for 2 days. Start taking on: July 30, 2019   gabapentin 300 MG capsule Commonly known as: NEURONTIN Take 300 mg by mouth at bedtime.   glimepiride 1 MG tablet Commonly known as: AMARYL Take 1 tablet (1 mg total) by  mouth daily with breakfast.   losartan 25 MG tablet Commonly known as: COZAAR Take 25 mg by mouth daily.   meloxicam 7.5 MG tablet Commonly known as: MOBIC Take 1 tablet (7.5 mg total) by mouth daily.   metFORMIN 1000 MG tablet Commonly known as: GLUCOPHAGE Take 1 tablet (1,000 mg total) by mouth 2 (two) times daily with a meal.   metoprolol tartrate 100 MG tablet Commonly known as: LOPRESSOR Take 1 tablet (100 mg total) by mouth 2 (two) times daily.       No Known Allergies  Consultations:  None.   Procedures/Studies: CT ANGIO CHEST PE W OR WO CONTRAST  Result Date: 07/27/2019 CLINICAL DATA:  Persistent hypoxia, COVID-19, elevated D-dimer. EXAM: CT ANGIOGRAPHY CHEST WITH CONTRAST TECHNIQUE: Multidetector CT imaging of the chest was performed using the standard protocol during bolus administration of intravenous contrast. Multiplanar CT image reconstructions and MIPs were obtained to evaluate the vascular anatomy. CONTRAST:  61mL OMNIPAQUE IOHEXOL 350 MG/ML SOLN COMPARISON:  None. FINDINGS: Cardiovascular: Image quality is degraded by respiratory motion, limiting the evaluation of segmental and subsegmental pulmonary arteries. No central, main or lobar pulmonary embolus. Atherosclerotic calcification of the aorta. Pulmonic trunk and heart are enlarged. Mediastinum/Nodes: No pathologically enlarged mediastinal, hilar or axillary lymph nodes. Esophagus is grossly unremarkable. Lungs/Pleura: Image quality is degraded by respiratory motion. Extensive patchy bilateral pulmonary parenchymal ground-glass with areas of consolidation. No pleural fluid. Airway is unremarkable. Upper Abdomen: Liver is decreased in attenuation diffusely. Liver, gallbladder and adrenal glands are unremarkable. Visualized portions of the kidneys, spleen, pancreas, stomach and bowel are grossly unremarkable. No upper abdominal adenopathy. Musculoskeletal: Degenerative changes in the spine. No worrisome lytic or  sclerotic lesions. Review of the MIP images confirms the above findings. IMPRESSION: 1. Image quality is degraded by respiratory motion, limiting the evaluation of segmental and subsegmental pulmonary arteries. No central, main or lobar pulmonary embolus. 2. Extensive bilateral COVID pneumonia. 3. Hepatic steatosis. 4. Enlarged pulmonic trunk, indicative of pulmonary arterial hypertension. Electronically Signed   By: Lorin Picket M.D.   On: 07/27/2019 16:15   DG CHEST PORT 1 VIEW  Result Date: 07/23/2019 CLINICAL DATA:  Evaluate pneumonia due to COVID. EXAM: PORTABLE CHEST 1 VIEW COMPARISON:  10/20/2010 FINDINGS: Heart size normal. Decreased lung volumes with asymmetric elevation of right hemidiaphragm. Persistent, bilateral peripheral predominant interstitial and airspace densities are again noted and appear increased from previous exam IMPRESSION: 1. Worsening aeration to the lungs bilaterally compatible with progressive pneumonia. 2. No new findings. 3. Stable asymmetric elevation of right hemidiaphragm. Electronically Signed   By: Kerby Moors M.D.   On: 07/23/2019 10:58   DG Chest Portable 1 View  Result Date: 07/21/2019 CLINICAL DATA:  63 year old female with fever and cough for several days. COVID-19 test pending. EXAM: PORTABLE CHEST 1 VIEW COMPARISON:  None. FINDINGS: Portable AP semi upright view at 2326 hours. Multifocal patchy and confluent mostly peripheral bilateral pulmonary opacity sparing the lung apices. Superimposed low lung volumes. Mediastinal contours remain normal. No pneumothorax, pulmonary edema or pleural effusion. Visualized tracheal air column is within normal limits. Negative visible bowel gas pattern. No acute osseous abnormality identified. IMPRESSION: Patchy and confluent bilateral peripheral mid and lower lung opacity, most compatible with bilateral pneumonia in this clinical setting. This is a pattern frequently seen with COVID-19. Electronically Signed   By: Genevie Ann  M.D.   On: 07/21/2019 23:36     Subjective: Patient seen and examined.  She was eating breakfast in the morning.  We ambulated her in the hallway, she had no symptoms.  Oxygen remained more than 91%.  Denies any other symptoms. Limited English, very appreciative and pleased to go home.   Discharge Exam: Vitals:   07/29/19 1056 07/29/19 1057  BP:    Pulse:    Resp:    Temp:    SpO2: 95% 95%   Vitals:   07/29/19 1054 07/29/19 1055 07/29/19 1056 07/29/19 1057  BP:      Pulse:      Resp:      Temp:      TempSrc:      SpO2: 95% (!) 88% 95% 95%  Weight:      Height:        General: Pt is alert, awake, not in acute distress, on room air Cardiovascular: RRR, S1/S2 +, no rubs, no gallops Respiratory: CTA bilaterally, no wheezing, no rhonchi Abdominal: Soft, NT, ND, bowel sounds + Extremities: no edema, no cyanosis    The results of significant diagnostics from this hospitalization (including imaging, microbiology, ancillary and laboratory) are listed below for reference.     Microbiology: No results found for this or any previous visit (from the past 240 hour(s)).   Labs: BNP (last 3 results) No results for input(s): BNP in the last 8760 hours. Basic Metabolic Panel: Recent Labs  Lab 07/23/19 0224 07/25/19 0412  NA 133* 134*  K 4.2 4.8  CL 100 99  CO2 22 23  GLUCOSE 239* 262*  BUN 22 34*  CREATININE 0.78 0.93  CALCIUM 8.6* 8.9  MG 1.7  --   PHOS 3.4  --    Liver Function Tests: Recent Labs  Lab 07/23/19 0224 07/25/19 0412  AST 79* 61*  ALT 50* 72*  ALKPHOS 37* 46  BILITOT 0.3 0.2*  PROT 7.2 7.1  ALBUMIN 2.7* 2.7*   No results for input(s): LIPASE, AMYLASE in the last 168 hours. No results for input(s): AMMONIA in the last 168 hours. CBC: Recent Labs  Lab 07/23/19 0224 07/25/19 0412  WBC 6.1 10.6*  NEUTROABS 4.8 9.0*  HGB 10.4* 10.7*  HCT 31.3* 32.7*  MCV 77.7* 79.0*  PLT 266 364   Cardiac Enzymes: No results for input(s): CKTOTAL, CKMB,  CKMBINDEX, TROPONINI in the last 168 hours. BNP: Invalid input(s): POCBNP CBG: Recent Labs  Lab 07/28/19 1637 07/28/19 2016 07/28/19 2125 07/29/19 0725 07/29/19 1149  GLUCAP 277* 375* 365* 125* 240*   D-Dimer Recent Labs    07/27/19 0515  DDIMER 3.05*   Hgb A1c No results for input(s): HGBA1C in the last 72 hours. Lipid Profile No results for input(s): CHOL, HDL, LDLCALC, TRIG, CHOLHDL, LDLDIRECT in the last 72 hours. Thyroid function studies No results for input(s): TSH, T4TOTAL, T3FREE, THYROIDAB in the last 72 hours.  Invalid input(s): FREET3 Anemia work up No  results for input(s): VITAMINB12, FOLATE, FERRITIN, TIBC, IRON, RETICCTPCT in the last 72 hours. Urinalysis    Component Value Date/Time   COLORURINE YELLOW 07/25/2019 0045   APPEARANCEUR CLEAR 07/25/2019 0045   LABSPEC 1.021 07/25/2019 0045   PHURINE 6.0 07/25/2019 0045   GLUCOSEU >=500 (A) 07/25/2019 0045   HGBUR NEGATIVE 07/25/2019 0045   BILIRUBINUR NEGATIVE 07/25/2019 0045   KETONESUR NEGATIVE 07/25/2019 0045   PROTEINUR 30 (A) 07/25/2019 0045   NITRITE NEGATIVE 07/25/2019 0045   LEUKOCYTESUR SMALL (A) 07/25/2019 0045   Sepsis Labs Invalid input(s): PROCALCITONIN,  WBC,  LACTICIDVEN Microbiology No results found for this or any previous visit (from the past 240 hour(s)).   Time coordinating discharge:  35 minutes  SIGNED:   Barb Merino, MD  [facility] 07/29/2019, 12:19 PM

## 2019-07-29 NOTE — Progress Notes (Signed)
   07/29/19 1255  Family/Significant Other Communication  Family/Significant Other Update Called;Updated (discharge instructions )

## 2019-07-29 NOTE — Progress Notes (Signed)
   07/29/19 1100  Family/Significant Other Communication  Family/Significant Other Update Called;Updated (daughter Dana)

## 2020-05-30 ENCOUNTER — Emergency Department (HOSPITAL_COMMUNITY)
Admission: EM | Admit: 2020-05-30 | Discharge: 2020-05-30 | Disposition: A | Discharge status: Home / Self Care | Payer: Medicaid Other | Attending: Emergency Medicine | Admitting: Emergency Medicine

## 2020-05-30 ENCOUNTER — Other Ambulatory Visit: Payer: Self-pay

## 2020-05-30 DIAGNOSIS — F0390 Unspecified dementia without behavioral disturbance: Secondary | ICD-10-CM | POA: Diagnosis not present

## 2020-05-30 DIAGNOSIS — Z79899 Other long term (current) drug therapy: Secondary | ICD-10-CM | POA: Insufficient documentation

## 2020-05-30 DIAGNOSIS — E1165 Type 2 diabetes mellitus with hyperglycemia: Secondary | ICD-10-CM | POA: Insufficient documentation

## 2020-05-30 DIAGNOSIS — Z7984 Long term (current) use of oral hypoglycemic drugs: Secondary | ICD-10-CM | POA: Insufficient documentation

## 2020-05-30 DIAGNOSIS — I10 Essential (primary) hypertension: Secondary | ICD-10-CM | POA: Insufficient documentation

## 2020-05-30 DIAGNOSIS — Z7982 Long term (current) use of aspirin: Secondary | ICD-10-CM | POA: Insufficient documentation

## 2020-05-30 DIAGNOSIS — R000 Tachycardia, unspecified: Secondary | ICD-10-CM | POA: Diagnosis not present

## 2020-05-30 DIAGNOSIS — R739 Hyperglycemia, unspecified: Secondary | ICD-10-CM

## 2020-05-30 LAB — URINALYSIS, ROUTINE W REFLEX MICROSCOPIC
Bilirubin Urine: NEGATIVE
Glucose, UA: >=500 mg/dL — AB
Hgb urine dipstick: NEGATIVE
Ketones, ur: NEGATIVE mg/dL
Leukocytes,Ua: NEGATIVE
Nitrite: NEGATIVE
Protein, ur: NEGATIVE mg/dL
Specific Gravity, Urine: 1.013 (ref 1.005–1.030)
pH: 6 (ref 5.0–8.0)

## 2020-05-30 LAB — BASIC METABOLIC PANEL
Anion gap: 11 (ref 5–15)
BUN: 20 mg/dL (ref 8–23)
CO2: 23 mmol/L (ref 22–32)
Calcium: 9.5 mg/dL (ref 8.9–10.3)
Chloride: 100 mmol/L (ref 98–111)
Creatinine, Ser: 1.8 mg/dL — ABNORMAL HIGH (ref 0.44–1.00)
GFR, Estimated: 29 mL/min — ABNORMAL LOW (ref >60)
Glucose, Bld: 477 mg/dL — ABNORMAL HIGH (ref 70–99)
Potassium: 4.7 mmol/L (ref 3.5–5.1)
Sodium: 134 mmol/L — ABNORMAL LOW (ref 135–145)

## 2020-05-30 LAB — CBC
HCT: 30.8 % — ABNORMAL LOW (ref 36.0–46.0)
Hemoglobin: 9.9 g/dL — ABNORMAL LOW (ref 12.0–15.0)
MCH: 25.9 pg — ABNORMAL LOW (ref 26.0–34.0)
MCHC: 32.1 g/dL (ref 30.0–36.0)
MCV: 80.6 fL (ref 80.0–100.0)
Platelets: 254 10*3/uL (ref 150–400)
RBC: 3.82 MIL/uL — ABNORMAL LOW (ref 3.87–5.11)
RDW: 13.9 % (ref 11.5–15.5)
WBC: 9 10*3/uL (ref 4.0–10.5)
nRBC: 0 % (ref 0.0–0.2)

## 2020-05-30 LAB — CBG MONITORING, ED
Glucose-Capillary: 454 mg/dL — ABNORMAL HIGH (ref 70–99)
Glucose-Capillary: 334 mg/dL — ABNORMAL HIGH (ref 70–99)

## 2020-05-30 MED ORDER — SODIUM CHLORIDE 0.9 % IV BOLUS
1000.0000 mL | Freq: Once | INTRAVENOUS | Status: AC
  Administered 2020-05-30: 1000 mL via INTRAVENOUS

## 2020-05-30 NOTE — ED Notes (Signed)
Pt was called 3 times no answer.

## 2020-05-30 NOTE — ED Triage Notes (Signed)
Pt with hx of diabetes, out of insulin x 2-3 weeks. Hyperglycemic at PCP today and sent here for further eval. Pt also hypertensive but reports compliance with bp meds.

## 2020-05-30 NOTE — ED Provider Notes (Signed)
[facility] EMERGENCY DEPARTMENT Provider Note   CSN: [id number] Arrival date & time: 05/30/20  1744     History Chief Complaint  Patient presents with  . Hyperglycemia    Jennifer Molina is a 64 y.o. female.  Patient presents to ED for hyperglycemia detected at PCP visit earlier today. Patient has been out of her diabetic medication for several weeks. Medication compliance is sporadic.  The history is provided by a relative. The history is limited by a language barrier.  Hyperglycemia Severity:  Moderate Onset quality:  Gradual Current diabetic therapy:  Metformin, amaryl Time since last antidiabetic medication:  2 weeks Context: noncompliance   Associated symptoms: no chest pain, no nausea and no vomiting        Past Medical History:  Diagnosis Date  . Dementia ([place]) 03/28/2019  . DM (diabetes mellitus), type 2, uncontrolled ([place]) 2015   Has poor vision.  Family not aware of A1C or what sugars run.  Believe not well controlled.  Not able to monitor due to cost of monitoring equipment  . Hypertension 2015    Patient Active Problem List   Diagnosis Date Noted  . Acute respiratory failure with hypoxia ([place]) 07/23/2019  . Sepsis due to COVID-19 ([place]) 07/22/2019  . Pneumonia due to COVID-19 virus 07/22/2019  . Dementia ([place]) 03/28/2019  . Chronic post-traumatic stress disorder (PTSD) 03/28/2019  . DM (diabetes mellitus), type 2, uncontrolled ([place]) 2015  . Hypertension 2015    No past surgical history on file.   OB History   No obstetric history on file.     Family History  Problem Relation Age of Onset  . Diabetes Mellitus II Neg Hx     Social History   Tobacco Use  . Smoking status: Never Smoker  . Smokeless tobacco: Never Used  Vaping Use  . Vaping Use: Never used  Substance Use Topics  . Alcohol use: Never  . Drug use: Never    Home Medications Prior to Admission medications   Medication Sig Start Date End Date Taking?  Authorizing Provider  acetaminophen (TYLENOL) 500 MG tablet Take 1,000 mg by mouth every 6 (six) hours as needed for mild pain or fever.    [provider]  amLODipine (NORVASC) 5 MG tablet Take 1 tablet (5 mg total) by mouth daily. 03/28/19   Mack Hook, MD  ASPIRIN LOW DOSE 81 MG EC tablet Take 81 mg by mouth daily.  03/17/19   [provider]  gabapentin (NEURONTIN) 300 MG capsule Take 300 mg by mouth at bedtime. 07/11/19   [provider]  glimepiride (AMARYL) 1 MG tablet Take 1 tablet (1 mg total) by mouth daily with breakfast. 03/28/19   Mack Hook, MD  losartan (COZAAR) 25 MG tablet Take 25 mg by mouth daily. 07/11/19   [provider]  meloxicam (MOBIC) 7.5 MG tablet Take 1 tablet (7.5 mg total) by mouth daily. 03/28/19   Mack Hook, MD  metFORMIN (GLUCOPHAGE) 1000 MG tablet Take 1 tablet (1,000 mg total) by mouth 2 (two) times daily with a meal. 03/28/19   Mack Hook, MD  metoprolol tartrate (LOPRESSOR) 100 MG tablet Take 1 tablet (100 mg total) by mouth 2 (two) times daily. 03/28/19   Mack Hook, MD    Allergies    Patient has no known allergies.  Review of Systems   Review of Systems  Cardiovascular: Negative for chest pain.  Gastrointestinal: Negative for nausea and vomiting.  All other systems reviewed and are  negative.   Physical Exam Updated Vital Signs BP (!) 203/88 (BP Location: Right Arm)   Pulse (!) 107   Temp 98.6 F (37 C) (Oral)   Resp 16   LMP  (LMP Unknown)   SpO2 100%   Physical Exam Constitutional:      Appearance: Normal appearance.  HENT:     Head: Normocephalic.     Nose: Nose normal.     Mouth/Throat:     Mouth: Mucous membranes are moist.  Eyes:     Conjunctiva/sclera: Conjunctivae normal.  Cardiovascular:     Rate and Rhythm: Tachycardia present.  Pulmonary:     Effort: Pulmonary effort is normal.     Breath sounds: Normal breath sounds.  Abdominal:     General:  Bowel sounds are normal. There is no distension.     Palpations: Abdomen is soft.     Tenderness: There is no abdominal tenderness.  Musculoskeletal:        General: Normal range of motion.     Right lower leg: No edema.     Left lower leg: No edema.  Skin:    General: Skin is warm and dry.  Neurological:     Mental Status: She is alert and oriented to person, place, and time.  Psychiatric:        Mood and Affect: Mood normal.        Behavior: Behavior normal.     ED Results / Procedures / Treatments   Labs (all labs ordered are listed, but only abnormal results are displayed) Labs Reviewed  BASIC METABOLIC PANEL - Abnormal; Notable for the following components:      Result Value   Sodium 134 (*)    Glucose, Bld 477 (*)    Creatinine, Ser 1.80 (*)    GFR, Estimated 29 (*)    All other components within normal limits  CBC - Abnormal; Notable for the following components:   RBC 3.82 (*)    Hemoglobin 9.9 (*)    HCT 30.8 (*)    MCH 25.9 (*)    All other components within normal limits  CBG MONITORING, ED - Abnormal; Notable for the following components:   Glucose-Capillary 454 (*)    All other components within normal limits  URINALYSIS, ROUTINE W REFLEX MICROSCOPIC    EKG None  Radiology No results found.  Procedures Procedures (including critical care time)  Medications Ordered in ED Medications - No data to display  ED Course  I have reviewed the triage vital signs and the nursing notes.  Pertinent labs & imaging results that were available during my care of the patient were reviewed by me and considered in my medical decision making (see chart for details).   Blood sugar improved after IV fluids. No anion gap.   MDM Rules/Calculators/A&P                         Patient discussed with Dr. Johnney Killian.   Given exam, history, and workup, low suspicion for emergent precipitating factor. Patient is diabetic with change in adherence to treatment plan. Blood  sugar down trending after IV fluids. Final Clinical Impression(s) / ED Diagnoses Final diagnoses:  Hyperglycemia    Rx / DC Orders ED Discharge Orders    None       Etta Quill, NP 05/30/20 2149    Charlesetta Shanks, MD 05/30/20 2337

## 2020-05-30 NOTE — Discharge Instructions (Signed)
Please be sure to take your diabetic medication every day as directed. Maintain oral hydration by drinking plenty of liquids. Follow-up with your primary care provider.

## 2020-07-31 ENCOUNTER — Other Ambulatory Visit: Payer: Self-pay | Admitting: Internal Medicine

## 2020-07-31 DIAGNOSIS — N1831 Chronic kidney disease, stage 3a: Secondary | ICD-10-CM

## 2020-08-07 ENCOUNTER — Encounter: Payer: Self-pay | Admitting: Hematology

## 2020-08-07 ENCOUNTER — Telehealth: Payer: Self-pay | Admitting: Hematology

## 2020-08-07 NOTE — Telephone Encounter (Signed)
Received a new hem referral from Dr. 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 Pt has been been scheduled to see Dr. Irene Limbo on 1/10 at 10am. Letter mailed.

## 2020-08-27 ENCOUNTER — Inpatient Hospital Stay: Payer: Medicaid Other

## 2020-08-27 ENCOUNTER — Inpatient Hospital Stay: Payer: Medicaid Other | Attending: Hematology | Admitting: Hematology

## 2020-08-27 ENCOUNTER — Telehealth: Payer: Self-pay | Admitting: Hematology

## 2020-08-27 ENCOUNTER — Other Ambulatory Visit: Payer: Self-pay

## 2020-08-27 VITALS — BP 177/79 | HR 76 | Temp 98.8°F | Resp 18 | Ht 61.0 in | Wt 135.8 lb

## 2020-08-27 DIAGNOSIS — D649 Anemia, unspecified: Secondary | ICD-10-CM

## 2020-08-27 DIAGNOSIS — D472 Monoclonal gammopathy: Secondary | ICD-10-CM | POA: Diagnosis not present

## 2020-08-27 DIAGNOSIS — N189 Chronic kidney disease, unspecified: Secondary | ICD-10-CM | POA: Diagnosis not present

## 2020-08-27 DIAGNOSIS — E1122 Type 2 diabetes mellitus with diabetic chronic kidney disease: Secondary | ICD-10-CM | POA: Insufficient documentation

## 2020-08-27 DIAGNOSIS — I129 Hypertensive chronic kidney disease with stage 1 through stage 4 chronic kidney disease, or unspecified chronic kidney disease: Secondary | ICD-10-CM | POA: Diagnosis not present

## 2020-08-27 LAB — CMP (CANCER CENTER ONLY)
ALT: 13 U/L (ref 0–44)
AST: 10 U/L — ABNORMAL LOW (ref 15–41)
Albumin: 3.8 g/dL (ref 3.5–5.0)
Alkaline Phosphatase: 47 U/L (ref 38–126)
Anion gap: 8 (ref 5–15)
BUN: 15 mg/dL (ref 8–23)
CO2: 26 mmol/L (ref 22–32)
Calcium: 9.3 mg/dL (ref 8.9–10.3)
Chloride: 103 mmol/L (ref 98–111)
Creatinine: 1.45 mg/dL — ABNORMAL HIGH (ref 0.44–1.00)
GFR, Estimated: 40 mL/min — ABNORMAL LOW (ref >60)
Glucose, Bld: 297 mg/dL — ABNORMAL HIGH (ref 70–99)
Potassium: 3.9 mmol/L (ref 3.5–5.1)
Sodium: 137 mmol/L (ref 135–145)
Total Bilirubin: 0.5 mg/dL (ref 0.3–1.2)
Total Protein: 8.7 g/dL — ABNORMAL HIGH (ref 6.5–8.1)

## 2020-08-27 LAB — LACTATE DEHYDROGENASE: LDH: 147 U/L (ref 98–192)

## 2020-08-27 LAB — CBC WITH DIFFERENTIAL/PLATELET
Abs Immature Granulocytes: 0.03 10*3/uL (ref 0.00–0.07)
Basophils Absolute: 0.1 10*3/uL (ref 0.0–0.1)
Basophils Relative: 1 %
Eosinophils Absolute: 0.1 10*3/uL (ref 0.0–0.5)
Eosinophils Relative: 1 %
HCT: 29.9 % — ABNORMAL LOW (ref 36.0–46.0)
Hemoglobin: 10 g/dL — ABNORMAL LOW (ref 12.0–15.0)
Immature Granulocytes: 0 %
Lymphocytes Relative: 27 %
Lymphs Abs: 2.4 10*3/uL (ref 0.7–4.0)
MCH: 25.5 pg — ABNORMAL LOW (ref 26.0–34.0)
MCHC: 33.4 g/dL (ref 30.0–36.0)
MCV: 76.3 fL — ABNORMAL LOW (ref 80.0–100.0)
Monocytes Absolute: 0.5 10*3/uL (ref 0.1–1.0)
Monocytes Relative: 5 %
Neutro Abs: 5.8 10*3/uL (ref 1.7–7.7)
Neutrophils Relative %: 66 %
Platelets: 271 10*3/uL (ref 150–400)
RBC: 3.92 MIL/uL (ref 3.87–5.11)
RDW: 14 % (ref 11.5–15.5)
WBC: 8.8 10*3/uL (ref 4.0–10.5)
nRBC: 0 % (ref 0.0–0.2)

## 2020-08-27 NOTE — Progress Notes (Signed)
HEMATOLOGY/ONCOLOGY CONSULTATION NOTE  Date of Service: 08/27/2020  Patient Care Team: Mack Hook, MD as PCP - General (Internal Medicine)  CHIEF COMPLAINTS/PURPOSE OF CONSULTATION:  Monoclonal Gammopathy  HISTORY OF PRESENTING ILLNESS:   Jennifer Molina is a wonderful 65 y.o. female who has been referred to [place] by Dr. 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   The pt was seen by nephrology for CKD thought to be related to uncontrolled HTN, uncontrolled DM2 and regular use of NSAIDS (meloxicam). Workup for chronic kidney disease done by Dr [name] showed monoclonal paraproteinemia with M spike of 1.7g/dl , with mild anemia.  Most recent lab results (07/31/2020) of CBC is as follows: all values are WNL except for Hgb at 9.7, HCT at 29.9, MCV at 78, MCH at 25.4, Glucose at 267, Creatinine at 1.12, GFR Est Non Af Am at 51.  07/31/2020 SPEP shows all values are WNL except EYC:XKGYJ Globulin at 2.2, M-spike at 1.7, Total Globulin at 4.3 07/31/2020 Kappa light chains at 39.5, Lambda light chains at 17.0, K/L light chain ratio  at 2.32  Patient notes no acute new symptoms. No focal bone pain. Mild fatigue. Notes poorly controlled HTN and DM2 but that she is working on controlling these.  MEDICAL HISTORY:  Past Medical History:  Diagnosis Date  . Dementia ([place]) 03/28/2019  . DM (diabetes mellitus), type 2, uncontrolled ([place]) 2015   Has poor vision.  Family not aware of A1C or what sugars run.  Believe not well controlled.  Not able to monitor due to cost of monitoring equipment  . Hypertension 2015    SURGICAL HISTORY: No past surgical history on file.  SOCIAL HISTORY: Social History   Socioeconomic History  . Marital status: Married    Spouse name: [name]  . Number of children: 7  . Years of education: 3  . Highest education level: 3rd grade  Occupational History  . Occupation: Retired from farming in [place]  Tobacco Use  . Smoking status:  Never Smoker  . Smokeless tobacco: Never Used  Vaping Use  . Vaping Use: Never used  Substance and Sexual Activity  . Alcohol use: Never  . Drug use: Never  . Sexual activity: Not on file  Other Topics Concern  . Not on file  Social History Narrative   Came to [place] in [place]   Lives at home with husband   Her 2 daughters, 2 sons and their families   All together, 108 people in the home   Social Determinants of Health   Financial Resource Strain: Not on file  Food Insecurity: Not on file  Transportation Needs: Not on file  Physical Activity: Not on file  Stress: Not on file  Social Connections: Not on file  Intimate Partner Violence: Not on file    FAMILY HISTORY: Family History  Problem Relation Age of Onset  . Diabetes Mellitus II Neg Hx     ALLERGIES:  has No Known Allergies.  MEDICATIONS:  Current Outpatient Medications  Medication Sig Dispense Refill  . acetaminophen (TYLENOL) 500 MG tablet Take 1,000 mg by mouth every 6 (six) hours as needed for mild pain or fever.    Marland Kitchen amLODipine (NORVASC) 5 MG tablet Take 1 tablet (5 mg total) by mouth daily. 30 tablet 11  . ASPIRIN LOW DOSE 81 MG EC tablet Take 81 mg by mouth daily.     Marland Kitchen gabapentin (NEURONTIN) 300 MG capsule Take 300 mg by mouth at bedtime.    Marland Kitchen  glimepiride (AMARYL) 1 MG tablet Take 1 tablet (1 mg total) by mouth daily with breakfast. 30 tablet 11  . losartan (COZAAR) 25 MG tablet Take 25 mg by mouth daily.    . meloxicam (MOBIC) 7.5 MG tablet Take 1 tablet (7.5 mg total) by mouth daily. 30 tablet 6  . metFORMIN (GLUCOPHAGE) 1000 MG tablet Take 1 tablet (1,000 mg total) by mouth 2 (two) times daily with a meal. 60 tablet 11  . metoprolol tartrate (LOPRESSOR) 100 MG tablet Take 1 tablet (100 mg total) by mouth 2 (two) times daily. 60 tablet 11   No current facility-administered medications for this visit.    REVIEW OF SYSTEMS:    10 Point review of Systems was done is negative except as noted  above.  PHYSICAL EXAMINATION: ECOG PERFORMANCE STATUS: 1 - Symptomatic but completely ambulatory  . Vitals:   08/27/20 0949  BP: (!) 177/79  Pulse: 76  Resp: 18  Temp: 98.8 F (37.1 C)  SpO2: 100%   Filed Weights   08/27/20 0949  Weight: 135 lb 12.8 oz (61.6 kg)   .Body mass index is 25.66 kg/m. NADGENERAL:alert, in no acute distress and comfortable SKIN: no acute rashes, no significant lesions EYES: conjunctiva are pink and non-injected, sclera anicteric OROPHARYNX: MMM, no exudates, no oropharyngeal erythema or ulceration NECK: supple, no JVD LYMPH:  no palpable lymphadenopathy in the cervical, axillary or inguinal regions LUNGS: clear to auscultation b/l with normal respiratory effort HEART: regular rate & rhythm ABDOMEN:  normoactive bowel sounds , non tender, not distended. Extremity: no pedal edema PSYCH: alert & oriented x 3 with fluent speech NEURO: no focal motor/sensory deficits  LABORATORY DATA:  I have reviewed the data as listed  . CBC Latest Ref Rng & Units 08/27/2020 05/30/2020 07/25/2019  WBC 4.0 - 10.5 K/uL 8.8 9.0 10.6(H)  Hemoglobin 12.0 - 15.0 g/dL 10.0(L) 9.9(L) 10.7(L)  Hematocrit 36.0 - 46.0 % 29.9(L) 30.8(L) 32.7(L)  Platelets 150 - 400 K/uL 271 254 364    . CMP Latest Ref Rng & Units 08/27/2020 05/30/2020 07/25/2019  Glucose 70 - 99 mg/dL 297(H) 477(H) 262(H)  BUN 8 - 23 mg/dL 15 20 34(H)  Creatinine 0.44 - 1.00 mg/dL 1.45(H) 1.80(H) 0.93  Sodium 135 - 145 mmol/L 137 134(L) 134(L)  Potassium 3.5 - 5.1 mmol/L 3.9 4.7 4.8  Chloride 98 - 111 mmol/L 103 100 99  CO2 22 - 32 mmol/L 26 23 23   Calcium 8.9 - 10.3 mg/dL 9.3 9.5 8.9  Total Protein 6.5 - 8.1 g/dL 8.7(H) - 7.1  Total Bilirubin 0.3 - 1.2 mg/dL 0.5 - 0.2(L)  Alkaline Phos 38 - 126 U/L 47 - 46  AST 15 - 41 U/L 10(L) - 61(H)  ALT 0 - 44 U/L 13 - 72(H)   . No results found for: IRON, TIBC, IRONPCTSAT (Iron and TIBC)  Lab Results  Component Value Date   FERRITIN 1,690 (H)  07/22/2019      RADIOGRAPHIC STUDIES: I have personally reviewed the radiological images as listed and agreed with the findings in the report. No results found.  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 Orders Placed This Encounter  Procedures  . NM PET Image Initial (PI) Whole Body    Standing Status:   Future    Standing Expiration Date:   08/27/2021    Order Specific Question:   If indicated for the ordered procedure, I authorize the administration of a radiopharmaceutical per Radiology protocol    Answer:   Yes    Order Specific Question:   Preferred imaging location?    Answer:   [facility]  . CBC with Differential/Platelet    Standing Status:   Future    Number of Occurrences:   1    Standing Expiration Date:   08/27/2021  . CMP ([facility] only)    Standing Status:   Future    Number of Occurrences:   1    Standing Expiration Date:   08/27/2021  . Multiple Myeloma Panel (SPEP&IFE w/QIG)    Standing Status:   Future    Number of Occurrences:   1    Standing Expiration Date:   08/27/2021  . Kappa/lambda light chains    Standing Status:   Future    Number of Occurrences:   1    Standing Expiration Date:   08/27/2021  . Beta 2 microglobulin, serum    Standing Status:   Future    Number of Occurrences:   1    Standing Expiration Date:   08/27/2021  . Lactate dehydrogenase    Standing Status:   Future    Number of Occurrences:   1    Standing Expiration Date:   08/27/2021  . 24-Hr Ur UPEP/UIFE/Light Chains/TP    Standing Status:   Future    Number of Occurrences:   1    Standing Expiration Date:    08/27/2021    FOLLOW UP: Labs today PET/CT In 1 week CT bone marrow aspiration and biopsy in 1 week RTC with Dr Irene Limbo in 3 weeks  All of the patients questions were answered with apparent satisfaction. The patient knows to call the clinic with any problems, questions or concerns.  I spent 45 mins counseling the patient face to face. The total time spent in the appointment was 60 mins and more than 50% was on counseling and direct patient cares.    Sullivan Lone MD [place] [facility]IV[facility] Hematology/Oncology Physician [facility]  (Office):       [phone] (Work cell):  [phone] (Fax):           [phone]  08/27/2020 12:11 AM  I, Yevette Edwards, am acting as a scribe for Dr. Sullivan Lone.   .I have reviewed the above documentation for accuracy and completeness, and I agree with the above. [name] MD

## 2020-08-27 NOTE — Telephone Encounter (Signed)
Scheduled appointment per 1/10 los. Spoke to patient who is aware of appointment date and time. Gave patient calendar print out.

## 2020-08-28 LAB — KAPPA/LAMBDA LIGHT CHAINS
Kappa free light chain: 59 mg/L — ABNORMAL HIGH (ref 3.3–19.4)
Kappa, lambda light chain ratio: 2.84 — ABNORMAL HIGH (ref 0.26–1.65)
Lambda free light chains: 20.8 mg/L (ref 5.7–26.3)

## 2020-08-28 LAB — BETA 2 MICROGLOBULIN, SERUM: Beta-2 Microglobulin: 2.5 mg/L — ABNORMAL HIGH (ref 0.6–2.4)

## 2020-08-29 ENCOUNTER — Ambulatory Visit
Admission: RE | Admit: 2020-08-29 | Discharge: 2020-08-29 | Disposition: A | Discharge status: Home / Self Care | Payer: Medicaid Other | Source: Ambulatory Visit | Attending: Internal Medicine | Admitting: Internal Medicine

## 2020-08-29 DIAGNOSIS — D472 Monoclonal gammopathy: Secondary | ICD-10-CM | POA: Diagnosis not present

## 2020-08-29 DIAGNOSIS — N1831 Chronic kidney disease, stage 3a: Secondary | ICD-10-CM

## 2020-08-29 LAB — MULTIPLE MYELOMA PANEL, SERUM
Albumin SerPl Elph-Mcnc: 3.8 g/dL (ref 2.9–4.4)
Albumin/Glob SerPl: 0.9 (ref 0.7–1.7)
Alpha 1: 0.2 g/dL (ref 0.0–0.4)
Alpha2 Glob SerPl Elph-Mcnc: 1 g/dL (ref 0.4–1.0)
B-Globulin SerPl Elph-Mcnc: 0.9 g/dL (ref 0.7–1.3)
Gamma Glob SerPl Elph-Mcnc: 2.3 g/dL — ABNORMAL HIGH (ref 0.4–1.8)
Globulin, Total: 4.5 g/dL — ABNORMAL HIGH (ref 2.2–3.9)
IgA: 198 mg/dL (ref 87–352)
IgG (Immunoglobin G), Serum: 2294 mg/dL — ABNORMAL HIGH (ref 586–1602)
IgM (Immunoglobulin M), Srm: 74 mg/dL (ref 26–217)
M Protein SerPl Elph-Mcnc: 1.9 g/dL — ABNORMAL HIGH
Total Protein ELP: 8.3 g/dL (ref 6.0–8.5)

## 2020-08-31 LAB — UPEP/UIFE/LIGHT CHAINS/TP, 24-HR UR
% BETA, Urine: 21.1 %
ALPHA 1 URINE: 6.9 %
Albumin, U: 33.9 %
Alpha 2, Urine: 12.7 %
Free Kappa Lt Chains,Ur: 172.7 mg/L — ABNORMAL HIGH (ref 0.63–113.79)
Free Kappa/Lambda Ratio: 5.62 (ref 1.03–31.76)
Free Lambda Lt Chains,Ur: 30.71 mg/L — ABNORMAL HIGH (ref 0.47–11.77)
GAMMA GLOBULIN URINE: 25.3 %
M-SPIKE %, Urine: 8.5 % — ABNORMAL HIGH
M-Spike, Mg/24 Hr: 113 mg/24 hr — ABNORMAL HIGH
Total Protein, Urine-Ur/day: 1332 mg/24 hr — ABNORMAL HIGH (ref 30–150)
Total Protein, Urine: 44.4 mg/dL
Total Volume: 3000

## 2020-09-10 ENCOUNTER — Telehealth (HOSPITAL_COMMUNITY): Payer: Self-pay

## 2020-09-16 NOTE — Progress Notes (Signed)
HEMATOLOGY/ONCOLOGY CONSULTATION NOTE  Date of Service: 09/16/2020  Patient Care Team: Mack Hook, MD as PCP - General (Internal Medicine)  CHIEF COMPLAINTS/PURPOSE OF CONSULTATION:  Monoclonal Gammopathy  HISTORY OF PRESENTING ILLNESS:   Jennifer Molina is a wonderful 65 y.o. female who has been referred to [place] by Dr. 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   The pt was seen by nephrology for CKD thought to be related to uncontrolled HTN, uncontrolled DM2 and regular use of NSAIDS (meloxicam). Workup for chronic kidney disease done by Dr [name] showed monoclonal paraproteinemia with M spike of 1.7g/dl , with mild anemia.  Most recent lab results (07/31/2020) of CBC is as follows: all values are WNL except for Hgb at 9.7, HCT at 29.9, MCV at 78, MCH at 25.4, Glucose at 267, Creatinine at 1.12, GFR Est Non Af Am at 51.  07/31/2020 SPEP shows all values are WNL except XBD:ZHGDJ Globulin at 2.2, M-spike at 1.7, Total Globulin at 4.3 07/31/2020 Kappa light chains at 39.5, Lambda light chains at 17.0, K/L light chain ratio at 2.32  Patient notes no acute new symptoms. No focal bone pain. Mild fatigue. Notes poorly controlled HTN and DM2 but that she is working on controlling these.  Interval History Jennifer Molina is a wonderful 65 y.o. female who is here today for evaluation and management of monoclonal gammopathy.The patient's last visit with [place] was on 08/27/2020. The pt reports that she is doing well overall. We are joined today by her daughter.   The pt reports that she was originally scheduled for the NM PET today, but was forced to reschedule due to elevated glucose levels. The urine testing showed elevated levels of abnormal protein.   Of note since the patient's last visit, pt has been scheduled for a NM PET Whole Body on 09/18/2020, CT Biopsy and CT Bine Marrow Biopsy & Aspiration on 09/24/2020.  On review of systems, pt reports neck pain and denies  headaches, dizziness, abdominal pain, leg swelling any other symptoms.   MEDICAL HISTORY:  Past Medical History:  Diagnosis Date  . Dementia ([place]) 03/28/2019  . DM (diabetes mellitus), type 2, uncontrolled ([place]) 2015   Has poor vision.  Family not aware of A1C or what sugars run.  Believe not well controlled.  Not able to monitor due to cost of monitoring equipment  . Hypertension 2015    SURGICAL HISTORY: No past surgical history on file.  SOCIAL HISTORY: Social History   Socioeconomic History  . Marital status: Married    Spouse name: [name]  . Number of children: 7  . Years of education: 3  . Highest education level: 3rd grade  Occupational History  . Occupation: Retired from farming in [place]  Tobacco Use  . Smoking status: Never Smoker  . Smokeless tobacco: Never Used  Vaping Use  . Vaping Use: Never used  Substance and Sexual Activity  . Alcohol use: Never  . Drug use: Never  . Sexual activity: Not on file  Other Topics Concern  . Not on file  Social History Narrative   [place] in [place]   Lives at home with husband   Her 2 daughters, 2 sons and their families   All together, 30 people in the home   Social Determinants of Health   Financial Resource Strain: Not on file  Food Insecurity: Not on file  Transportation Needs: Not on file  Physical Activity: Not on file  Stress: Not on file  Social Connections: Not on file  Intimate Partner Violence: Not on file    FAMILY HISTORY: Family History  Problem Relation Age of Onset  . Diabetes Mellitus II Neg Hx     ALLERGIES:  has No Known Allergies.  MEDICATIONS:  Current Outpatient Medications  Medication Sig Dispense Refill  . acetaminophen (TYLENOL) 500 MG tablet Take 1,000 mg by mouth every 6 (six) hours as needed for mild pain or fever.    Marland Kitchen amLODipine (NORVASC) 5 MG tablet Take 1 tablet (5 mg total) by mouth daily. 30 tablet 11  . ASPIRIN LOW DOSE 81 MG EC tablet Take 81 mg by mouth daily.      . FEROSUL 325 (65 Fe) MG tablet Take 325 mg by mouth daily.    Marland Kitchen gabapentin (NEURONTIN) 300 MG capsule Take 300 mg by mouth at bedtime.    Marland Kitchen glimepiride (AMARYL) 1 MG tablet Take 1 tablet (1 mg total) by mouth daily with breakfast. 30 tablet 11  . losartan (COZAAR) 25 MG tablet Take 25 mg by mouth daily.    Marland Kitchen losartan (COZAAR) 50 MG tablet Take 50 mg by mouth daily.    . meloxicam (MOBIC) 7.5 MG tablet Take 1 tablet (7.5 mg total) by mouth daily. 30 tablet 6  . metFORMIN (GLUCOPHAGE) 1000 MG tablet Take 1 tablet (1,000 mg total) by mouth 2 (two) times daily with a meal. 60 tablet 11  . metoprolol tartrate (LOPRESSOR) 100 MG tablet Take 1 tablet (100 mg total) by mouth 2 (two) times daily. 60 tablet 11  . rosuvastatin (CRESTOR) 20 MG tablet Take 20 mg by mouth daily.     No current facility-administered medications for this visit.    REVIEW OF SYSTEMS:    10 Point review of Systems was done is negative except as noted above.  PHYSICAL EXAMINATION: ECOG PERFORMANCE STATUS: 1 - Symptomatic but completely ambulatory  . Vitals:   09/17/20 1412  BP: (!) 184/88  Pulse: 94  Resp: 18  Temp: 97.8 F (36.6 C)  SpO2: 99%   Filed Weights   09/17/20 1412  Weight: 139 lb 1.6 oz (63.1 kg)   .Body mass index is 26.28 kg/m.  Exam was given in a chair.  GENERAL:alert, in no acute distress and comfortable SKIN: no acute rashes, no significant lesions EYES: conjunctiva are pink and non-injected, sclera anicteric OROPHARYNX: MMM, no exudates, no oropharyngeal erythema or ulceration NECK: supple, no JVD LYMPH:  no palpable lymphadenopathy in the cervical, axillary or inguinal regions LUNGS: clear to auscultation b/l with normal respiratory effort HEART: regular rate & rhythm ABDOMEN:  normoactive bowel sounds , non tender, not distended. Extremity: no pedal edema PSYCH: alert & oriented x 3 with fluent speech NEURO: no focal motor/sensory deficits   LABORATORY DATA:  I have reviewed  the data as listed  . CBC Latest Ref Rng & Units 08/27/2020 05/30/2020 07/25/2019  WBC 4.0 - 10.5 K/uL 8.8 9.0 10.6(H)  Hemoglobin 12.0 - 15.0 g/dL 10.0(L) 9.9(L) 10.7(L)  Hematocrit 36.0 - 46.0 % 29.9(L) 30.8(L) 32.7(L)  Platelets 150 - 400 K/uL 271 254 364    . CMP Latest Ref Rng & Units 08/27/2020 05/30/2020 07/25/2019  Glucose 70 - 99 mg/dL 297(H) 477(H) 262(H)  BUN 8 - 23 mg/dL 15 20 34(H)  Creatinine 0.44 - 1.00 mg/dL 1.45(H) 1.80(H) 0.93  Sodium 135 - 145 mmol/L 137 134(L) 134(L)  Potassium 3.5 - 5.1 mmol/L 3.9 4.7 4.8  Chloride 98 - 111 mmol/L 103 100 99  CO2  22 - 32 mmol/L 26 23 23   Calcium 8.9 - 10.3 mg/dL 9.3 9.5 8.9  Total Protein 6.5 - 8.1 g/dL 8.7(H) - 7.1  Total Bilirubin 0.3 - 1.2 mg/dL 0.5 - 0.2(L)  Alkaline Phos 38 - 126 U/L 47 - 46  AST 15 - 41 U/L 10(L) - 61(H)  ALT 0 - 44 U/L 13 - 72(H)   . No results found for: IRON, TIBC, IRONPCTSAT (Iron and TIBC)  Lab Results  Component Value Date   FERRITIN 1,690 (H) 07/22/2019      RADIOGRAPHIC STUDIES: I have personally reviewed the radiological images as listed and agreed with the findings in the report. US RENAL  Result Date: 08/29/2020 CLINICAL DATA:  Chronic kidney disease stage IIIa, diabetes mellitus, hypertension EXAM: RENAL / URINARY TRACT ULTRASOUND COMPLETE COMPARISON:  None FINDINGS: Right Kidney: Renal measurements: 11.2 x 5.0 x 6.6 cm = volume: 192 mL. Normal cortical thickness. Slightly increased cortical echogenicity. No mass, hydronephrosis, or shadowing calcification. Left Kidney: Renal measurements: 11.6 x 5.2 x 4.1 cm = volume: 130 mL. Normal cortical thickness. Slightly increased cortical echogenicity. No mass, hydronephrosis, or shadowing calcification. Bladder: Appears normal for degree of bladder distention. Other: None. IMPRESSION: Suspect medical renal disease changes. No evidence of renal mass or hydronephrosis. Electronically Signed   By: Lavonia Dana M.D.   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 -Advised pt that we need to observe Bm Bx results to determine if consistent with myeloma -PET/CT pending -Advise pt it is important to f/u w PCP regarding her diabetes and monitoring kidney functions. This plays an important role in tolerating treatment due to potential elevation of glucose levels even further. This needs to be controlled prior to starting treatment. The pt's current PCP is Raelyn Number. -Recommend pt f/u w Endocrinology to get diabetes under control. The pt does not wish for a referral at this time and will continue with her PCP. -Recommend pt check blood sugar at home regularly. Advised pt we want blood sugars between 100-200. Her current levels are >400, which is making her feel tired and more fatigued. -recommend pt maintain consistent eating schedule to help control sugars even better to avoid high spikes following the sugar drops. -Recommend pt drink >2L water daily. -Will see back in 2 weeks following scans.   FOLLOW UP: RTC w Dr Irene Limbo in 2 weeks  All of the patients questions were answered with apparent satisfaction. The patient knows to call the clinic with any problems, questions or concerns.   The total time spent in the appointment was 20 minutes and more than 50% was on counseling and direct patient cares.    Sullivan Lone MD [place] AAHIVMS [facility] Hematology/Oncology Physician [facility]  (Office):       [phone] (Work cell):  [phone] (Fax):           [phone]  09/16/2020 10:06 AM  I, Reinaldo Raddle, am acting as scribe for Dr. Sullivan Lone, MD.   .I have reviewed the above documentation for accuracy and completeness, and  I agree with the above. [name] MD

## 2020-09-17 ENCOUNTER — Encounter (HOSPITAL_COMMUNITY): Payer: Self-pay

## 2020-09-17 ENCOUNTER — Inpatient Hospital Stay (HOSPITAL_BASED_OUTPATIENT_CLINIC_OR_DEPARTMENT_OTHER): Payer: Medicaid Other | Admitting: Hematology

## 2020-09-17 ENCOUNTER — Other Ambulatory Visit: Payer: Self-pay

## 2020-09-17 ENCOUNTER — Ambulatory Visit (HOSPITAL_COMMUNITY)
Admission: RE | Admit: 2020-09-17 | Discharge: 2020-09-17 | Disposition: A | Discharge status: Home / Self Care | Payer: Medicaid Other | Source: Ambulatory Visit | Attending: Hematology | Admitting: Hematology

## 2020-09-17 VITALS — BP 184/88 | HR 94 | Temp 97.8°F | Resp 18 | Ht 61.0 in | Wt 139.1 lb

## 2020-09-17 DIAGNOSIS — D472 Monoclonal gammopathy: Secondary | ICD-10-CM

## 2020-09-17 LAB — GLUCOSE, CAPILLARY
Glucose-Capillary: 271 mg/dL — ABNORMAL HIGH (ref 70–99)
Glucose-Capillary: 303 mg/dL — ABNORMAL HIGH (ref 70–99)

## 2020-09-18 ENCOUNTER — Encounter (HOSPITAL_COMMUNITY): Payer: Self-pay

## 2020-09-18 ENCOUNTER — Ambulatory Visit (HOSPITAL_COMMUNITY): Admission: RE | Admit: 2020-09-18 | Payer: Medicaid Other | Source: Ambulatory Visit

## 2020-09-18 LAB — GLUCOSE, CAPILLARY: Glucose-Capillary: 282 mg/dL — ABNORMAL HIGH (ref 70–99)

## 2020-09-20 ENCOUNTER — Other Ambulatory Visit: Payer: Self-pay | Admitting: Radiology

## 2020-09-24 ENCOUNTER — Other Ambulatory Visit: Payer: Self-pay

## 2020-09-24 ENCOUNTER — Encounter (HOSPITAL_COMMUNITY): Payer: Self-pay

## 2020-09-24 ENCOUNTER — Ambulatory Visit (HOSPITAL_COMMUNITY)
Admission: RE | Admit: 2020-09-24 | Discharge: 2020-09-24 | Disposition: A | Discharge status: Home / Self Care | Payer: Medicaid Other | Source: Ambulatory Visit | Attending: Hematology | Admitting: Hematology

## 2020-09-24 DIAGNOSIS — D472 Monoclonal gammopathy: Secondary | ICD-10-CM | POA: Insufficient documentation

## 2020-09-24 LAB — CBC WITH DIFFERENTIAL/PLATELET
Abs Immature Granulocytes: 0.01 10*3/uL (ref 0.00–0.07)
Basophils Absolute: 0.1 10*3/uL (ref 0.0–0.1)
Basophils Relative: 1 %
Eosinophils Absolute: 0.2 10*3/uL (ref 0.0–0.5)
Eosinophils Relative: 3 %
HCT: 29.8 % — ABNORMAL LOW (ref 36.0–46.0)
Hemoglobin: 9.5 g/dL — ABNORMAL LOW (ref 12.0–15.0)
Immature Granulocytes: 0 %
Lymphocytes Relative: 43 %
Lymphs Abs: 2.6 10*3/uL (ref 0.7–4.0)
MCH: 25.6 pg — ABNORMAL LOW (ref 26.0–34.0)
MCHC: 31.9 g/dL (ref 30.0–36.0)
MCV: 80.3 fL (ref 80.0–100.0)
Monocytes Absolute: 0.4 10*3/uL (ref 0.1–1.0)
Monocytes Relative: 6 %
Neutro Abs: 2.9 10*3/uL (ref 1.7–7.7)
Neutrophils Relative %: 47 %
Platelets: 270 10*3/uL (ref 150–400)
RBC: 3.71 MIL/uL — ABNORMAL LOW (ref 3.87–5.11)
RDW: 14.6 % (ref 11.5–15.5)
WBC: 6.1 10*3/uL (ref 4.0–10.5)
nRBC: 0 % (ref 0.0–0.2)

## 2020-09-24 LAB — PROTIME-INR
INR: 0.9 (ref 0.8–1.2)
Prothrombin Time: 12.2 seconds (ref 11.4–15.2)

## 2020-09-24 LAB — BASIC METABOLIC PANEL
Anion gap: 10 (ref 5–15)
BUN: 19 mg/dL (ref 8–23)
CO2: 25 mmol/L (ref 22–32)
Calcium: 9 mg/dL (ref 8.9–10.3)
Chloride: 101 mmol/L (ref 98–111)
Creatinine, Ser: 0.95 mg/dL (ref 0.44–1.00)
GFR, Estimated: >60 mL/min (ref >60)
Glucose, Bld: 289 mg/dL — ABNORMAL HIGH (ref 70–99)
Potassium: 4.1 mmol/L (ref 3.5–5.1)
Sodium: 136 mmol/L (ref 135–145)

## 2020-09-24 LAB — GLUCOSE, CAPILLARY: Glucose-Capillary: 266 mg/dL — ABNORMAL HIGH (ref 70–99)

## 2020-09-24 MED ORDER — NALOXONE HCL 0.4 MG/ML IJ SOLN
INTRAMUSCULAR | Status: AC
  Filled 2020-09-24: qty 1

## 2020-09-24 MED ORDER — LIDOCAINE HCL (PF) 1 % IJ SOLN
INTRAMUSCULAR | Status: AC | PRN
  Administered 2020-09-24: 10 mL via INTRADERMAL

## 2020-09-24 MED ORDER — SODIUM CHLORIDE 0.9 % IV SOLN: INTRAVENOUS | Status: DC

## 2020-09-24 MED ORDER — FLUMAZENIL 0.5 MG/5ML IV SOLN
INTRAVENOUS | Status: AC
  Filled 2020-09-24: qty 5

## 2020-09-24 MED ORDER — FENTANYL CITRATE (PF) 100 MCG/2ML IJ SOLN
INTRAMUSCULAR | Status: AC | PRN
Start: 2020-09-24 — End: 2020-09-24
  Administered 2020-09-24 (×2): 50 ug via INTRAVENOUS

## 2020-09-24 MED ORDER — MIDAZOLAM HCL 2 MG/2ML IJ SOLN
INTRAMUSCULAR | Status: AC
  Filled 2020-09-24: qty 4

## 2020-09-24 MED ORDER — FENTANYL CITRATE (PF) 100 MCG/2ML IJ SOLN
INTRAMUSCULAR | Status: AC
  Filled 2020-09-24: qty 2

## 2020-09-24 MED ORDER — MIDAZOLAM HCL 2 MG/2ML IJ SOLN
INTRAMUSCULAR | Status: AC | PRN
  Administered 2020-09-24 (×2): 1 mg via INTRAVENOUS

## 2020-09-24 NOTE — Procedures (Signed)
Interventional Radiology Procedure Note  Procedure: RT ILIAC BM ASP AND CORE    Complications: None  Estimated Blood Loss:  MIN  Findings: 11 G CORE AND ASP    M. TREVOR SHICK, MD    

## 2020-09-24 NOTE — Consult Note (Signed)
 Chief Complaint: Patient was seen in consultation today for CT guided bone marrow biopsy  Referring Physician(s): Kale,Gautam Kishore  Supervising Physician: Shick, Trevor  Patient Status: [facility] - Out-pt  History of Present Illness: Jennifer Molina is a 65 y.o. female with history of chronic kidney disease, hypertension, diabetes, anemia and newly diagnosed monoclonal gammopathy who presents today for CT-guided bone marrow biopsy for further evaluation/rule out myeloma.  Past Medical History:  Diagnosis Date  . Dementia (HCC) 03/28/2019  . DM (diabetes mellitus), type 2, uncontrolled (HCC) 2015   Has poor vision.  Family not aware of A1C or what sugars run.  Believe not well controlled.  Not able to monitor due to cost of monitoring equipment  . Hypertension 2015    History reviewed. No pertinent surgical history.  Allergies: Patient has no known allergies.  Medications: Prior to Admission medications   Medication Sig Start Date End Date Taking? Authorizing Provider  amLODipine (NORVASC) 5 MG tablet Take 1 tablet (5 mg total) by mouth daily. 03/28/19  Yes Mulberry, Elizabeth, MD  ASPIRIN LOW DOSE 81 MG EC tablet Take 81 mg by mouth daily.  03/17/19  Yes [provider]  FEROSUL 325 (65 Fe) MG tablet Take 325 mg by mouth daily. 08/08/20  Yes [provider]  gabapentin (NEURONTIN) 300 MG capsule Take 300 mg by mouth at bedtime. 07/11/19  Yes [provider]  glimepiride (AMARYL) 1 MG tablet Take 1 tablet (1 mg total) by mouth daily with breakfast. 03/28/19  Yes Mulberry, Elizabeth, MD  losartan (COZAAR) 25 MG tablet Take 25 mg by mouth daily. 07/11/19  Yes [provider]  losartan (COZAAR) 50 MG tablet Take 50 mg by mouth daily. 06/15/20  Yes [provider]  meloxicam (MOBIC) 7.5 MG tablet Take 1 tablet (7.5 mg total) by mouth daily. 03/28/19  Yes Mulberry, Elizabeth, MD  metFORMIN (GLUCOPHAGE) 1000 MG tablet Take 1 tablet (1,000 mg total)  by mouth 2 (two) times daily with a meal. 03/28/19  Yes Mulberry, Elizabeth, MD  metoprolol tartrate (LOPRESSOR) 100 MG tablet Take 1 tablet (100 mg total) by mouth 2 (two) times daily. 03/28/19  Yes Mulberry, Elizabeth, MD  rosuvastatin (CRESTOR) 20 MG tablet Take 20 mg by mouth daily. 08/01/20  Yes [provider]  acetaminophen (TYLENOL) 500 MG tablet Take 1,000 mg by mouth every 6 (six) hours as needed for mild pain or fever.    [provider]     Family History  Problem Relation Age of Onset  . Diabetes Mellitus II Neg Hx     Social History   Socioeconomic History  . Marital status: Married    Spouse name: Brom Ayun  . Number of children: 7  . Years of education: 3  . Highest education level: 3rd grade  Occupational History  . Occupation: Retired from farming in [place]  Tobacco Use  . Smoking status: Never Smoker  . Smokeless tobacco: Never Used  Vaping Use  . Vaping Use: Never used  Substance and Sexual Activity  . Alcohol use: Never  . Drug use: Never  . Sexual activity: Not on file  Other Topics Concern  . Not on file  Social History Narrative   Came to [place] in [place]   Lives at home with husband   Her 2 daughters, 2 sons and their families   All together, 11 people in the home   Social Determinants of Health   Financial Resource Strain: Not on file  Food Insecurity: Not   on file  Transportation Needs: Not on file  Physical Activity: Not on file  Stress: Not on file  Social Connections: Not on file      Review of Systems:  denies fever,HA,CP,dyspnea, cough, abd/back pain,N/V or bleeding  Vital Signs: BP (!) 197/96 (BP Location: Right Arm)   Pulse 74   Temp 98.1 F (36.7 C) (Oral)   Resp 20   LMP  (LMP Unknown)   SpO2 100%   Physical Exam awake, alert.  Chest clear to auscultation bilaterally.  Heart with regular rate and rhythm, positive murmur.  Abdomen soft, positive bowel sounds, nontender.  No lower extremity  edema.  Imaging: US RENAL  Result Date: 08/29/2020 CLINICAL DATA:  Chronic kidney disease stage IIIa, diabetes mellitus, hypertension EXAM: RENAL / URINARY TRACT ULTRASOUND COMPLETE COMPARISON:  None FINDINGS: Right Kidney: Renal measurements: 11.2 x 5.0 x 6.6 cm = volume: 192 mL. Normal cortical thickness. Slightly increased cortical echogenicity. No mass, hydronephrosis, or shadowing calcification. Left Kidney: Renal measurements: 11.6 x 5.2 x 4.1 cm = volume: 130 mL. Normal cortical thickness. Slightly increased cortical echogenicity. No mass, hydronephrosis, or shadowing calcification. Bladder: Appears normal for degree of bladder distention. Other: None. IMPRESSION: Suspect medical renal disease changes. No evidence of renal mass or hydronephrosis. Electronically Signed   By: Mark  Boles M.D.   On: 08/29/2020 14:24    Labs:  CBC: Recent Labs    05/30/20 1755 08/27/20 1106 09/24/20 0805  WBC 9.0 8.8 6.1  HGB 9.9* 10.0* 9.5*  HCT 30.8* 29.9* 29.8*  PLT 254 271 270    COAGS: Recent Labs    09/24/20 0805  INR 0.9    BMP: Recent Labs    05/30/20 1755 08/27/20 1106 09/24/20 0805  NA 134* 137 136  K 4.7 3.9 4.1  CL 100 103 101  CO2 23 26 25  GLUCOSE 477* 297* 289*  BUN 20 15 19  CALCIUM 9.5 9.3 9.0  CREATININE 1.80* 1.45* 0.95  GFRNONAA 29* 40* >60    LIVER FUNCTION TESTS: Recent Labs    08/27/20 1106  BILITOT 0.5  AST 10*  ALT 13  ALKPHOS 47  PROT 8.7*  ALBUMIN 3.8    TUMOR MARKERS: No results for input(s): AFPTM, CEA, CA199, CHROMGRNA in the last 8760 hours.  Assessment and Plan: 65 y.o. female with history of chronic kidney disease, hypertension, diabetes, anemia and newly diagnosed monoclonal gammopathy who presents today for CT-guided bone marrow biopsy for further evaluation/rule out myeloma.Risks and benefits of procedure was discussed with the patient via interpreter including, but not limited to bleeding, infection, damage to adjacent structures or  low yield requiring additional tests.  All of the questions were answered and there is agreement to proceed.  Consent signed and in chart.     Thank you for this interesting consult.  I greatly enjoyed meeting Jalyiah Binsfeld and look forward to participating in their care.  A copy of this report was sent to the requesting provider on this date.  Electronically Signed: D. Kevin Allred, PA-C 09/24/2020, 8:37 AM   I spent a total of  20 minutes   in face to face in clinical consultation, greater than 50% of which was counseling/coordinating care for CT guided bone marrow biopsy  

## 2020-09-24 NOTE — Discharge Instructions (Signed)
Please call Interventional Radiology clinic [phone] with any questions or concerns. ° °You may remove your dressing and shower tomorrow. ° ° °Bone Marrow Aspiration and Bone Marrow Biopsy, Adult, Care After °This sheet gives you information about how to care for yourself after your procedure. Your health care provider may also give you more specific instructions. If you have problems or questions, contact your health care provider. °What can I expect after the procedure? °After the procedure, it is common to have: °Mild pain and tenderness. °Swelling. °Bruising. °Follow these instructions at home: °Puncture site care °Follow instructions from your health care provider about how to take care of the puncture site. Make sure you: °Wash your hands with soap and water before and after you change your bandage (dressing). If soap and water are not available, use hand sanitizer. °Change your dressing as told by your health care provider. °Check your puncture site every day for signs of infection. Check for: °More redness, swelling, or pain. °Fluid or blood. °Warmth. °Pus or a bad smell.   °Activity °Return to your normal activities as told by your health care provider. Ask your health care provider what activities are safe for you. °Do not lift anything that is heavier than 10 lb (4.5 kg), or the limit that you are told, until your health care provider says that it is safe. °Do not drive for 24 hours if you were given a sedative during your procedure. °General instructions °Take over-the-counter and prescription medicines only as told by your health care provider. °Do not take baths, swim, or use a hot tub until your health care provider approves. Ask your health care provider if you may take showers. You may only be allowed to take sponge baths. °If directed, put ice on the affected area. To do this: °Put ice in a plastic bag. °Place a towel between your skin and the bag. °Leave the ice on for 20 minutes, 2-3 times a  day. °Keep all follow-up visits as told by your health care provider. This is important.   °Contact a health care provider if: °Your pain is not controlled with medicine. °You have a fever. °You have more redness, swelling, or pain around the puncture site. °You have fluid or blood coming from the puncture site. °Your puncture site feels warm to the touch. °You have pus or a bad smell coming from the puncture site. °Summary °After the procedure, it is common to have mild pain, tenderness, swelling, and bruising. °Follow instructions from your health care provider about how to take care of the puncture site and what activities are safe for you. °Take over-the-counter and prescription medicines only as told by your health care provider. °Contact a health care provider if you have any signs of infection, such as fluid or blood coming from the puncture site. °This information is not intended to replace advice given to you by your health care provider. Make sure you discuss any questions you have with your health care provider. °Document Revised: 12/21/2018 Document Reviewed: 12/21/2018 °Elsevier Patient Education © 2021 [employer] ° ° °Moderate Conscious Sedation, Adult, Care After °This sheet gives you information about how to care for yourself after your procedure. Your health care provider may also give you more specific instructions. If you have problems or questions, contact your health care provider. °What can I expect after the procedure? °After the procedure, it is common to have: °Sleepiness for several hours. °Impaired judgment for several hours. °Difficulty with balance. °Vomiting if you eat too   soon. °Follow these instructions at home: °For the time period you were told by your health care provider: °Rest. °Do not participate in activities where you could fall or become injured. °Do not drive or use machinery. °Do not drink alcohol. °Do not take sleeping pills or medicines that cause drowsiness. °Do not  make important decisions or sign legal documents. °Do not take care of children on your own.  °  °  °Eating and drinking °Follow the diet recommended by your health care provider. °Drink enough fluid to keep your urine pale yellow. °If you vomit: °Drink water, juice, or soup when you can drink without vomiting. °Make sure you have little or no nausea before eating solid foods.   °General instructions °Take over-the-counter and prescription medicines only as told by your health care provider. °Have a responsible adult stay with you for the time you are told. It is important to have someone help care for you until you are awake and alert. °Do not smoke. °Keep all follow-up visits as told by your health care provider. This is important. °Contact a health care provider if: °You are still sleepy or having trouble with balance after 24 hours. °You feel light-headed. °You keep feeling nauseous or you keep vomiting. °You develop a rash. °You have a fever. °You have redness or swelling around the IV site. °Get help right away if: °You have trouble breathing. °You have new-onset confusion at home. °Summary °After the procedure, it is common to feel sleepy, have impaired judgment, or feel nauseous if you eat too soon. °Rest after you get home. Know the things you should not do after the procedure. °Follow the diet recommended by your health care provider and drink enough fluid to keep your urine pale yellow. °Get help right away if you have trouble breathing or new-onset confusion at home. °This information is not intended to replace advice given to you by your health care provider. Make sure you discuss any questions you have with your health care provider. °Document Revised: 12/02/2019 Document Reviewed: 06/30/2019 °Elsevier Patient Education © 2021 [employer]  °

## 2020-09-26 LAB — SURGICAL PATHOLOGY

## 2020-09-28 ENCOUNTER — Ambulatory Visit (HOSPITAL_COMMUNITY)
Admission: RE | Admit: 2020-09-28 | Discharge: 2020-09-28 | Disposition: A | Discharge status: Home / Self Care | Payer: Medicaid Other | Source: Ambulatory Visit | Attending: Hematology | Admitting: Hematology

## 2020-09-28 ENCOUNTER — Other Ambulatory Visit: Payer: Self-pay

## 2020-09-28 DIAGNOSIS — I700 Atherosclerosis of aorta: Secondary | ICD-10-CM | POA: Diagnosis not present

## 2020-09-28 DIAGNOSIS — D472 Monoclonal gammopathy: Secondary | ICD-10-CM | POA: Insufficient documentation

## 2020-09-28 DIAGNOSIS — I2510 Atherosclerotic heart disease of native coronary artery without angina pectoris: Secondary | ICD-10-CM | POA: Insufficient documentation

## 2020-09-28 LAB — GLUCOSE, CAPILLARY: Glucose-Capillary: 286 mg/dL — ABNORMAL HIGH (ref 70–99)

## 2020-09-28 MED ORDER — FLUDEOXYGLUCOSE F - 18 (FDG) INJECTION
6.4000 | Freq: Once | INTRAVENOUS | Status: AC
  Administered 2020-09-28: 6.4 via INTRAVENOUS

## 2020-10-01 NOTE — Progress Notes (Signed)
HEMATOLOGY/ONCOLOGY CONSULTATION NOTE  Date of Service: 10/01/2020  Patient Care Team: Trey Sailors, PA as PCP - General (Physician Assistant)  CHIEF COMPLAINTS/PURPOSE OF CONSULTATION:  Monoclonal Gammopathy  HISTORY OF PRESENTING ILLNESS:   Jennifer Molina is a wonderful 65 y.o. female who has been referred to [place] by Dr. 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   The pt was seen by nephrology for CKD thought to be related to uncontrolled HTN, uncontrolled DM2 and regular use of NSAIDS (meloxicam). Workup for chronic kidney disease done by Dr [name] showed monoclonal paraproteinemia with M spike of 1.7g/dl , with mild anemia.  Most recent lab results (07/31/2020) of CBC is as follows: all values are WNL except for Hgb at 9.7, HCT at 29.9, MCV at 78, MCH at 25.4, Glucose at 267, Creatinine at 1.12, GFR Est Non Af Am at 51.  07/31/2020 SPEP shows all values are WNL except SFS:ELTRV Globulin at 2.2, M-spike at 1.7, Total Globulin at 4.3 07/31/2020 Kappa light chains at 39.5, Lambda light chains at 17.0, K/L light chain ratio at 2.32  Patient notes no acute new symptoms. No focal bone pain. Mild fatigue. Notes poorly controlled HTN and DM2 but that she is working on controlling these.  Interval History Jennifer Molina is a wonderful 65 y.o. female who is here today for evaluation and management of monoclonal gammopathy.The patient's last visit with [place] was on 09/17/2020. The pt reports that she is doing well overall. We are joined today by her interpretor.   The pt reports that she has been improving since her Bm Bx. She recently had a visit with her doctor and fixed her blood sugar medications. This has improved her muscle cramping, sleep, and eating.   Of note since the patient's last visit, pt has had NM PET Whole Body ([id number]) on 09/28/2020, which revealed "1. No FDG avid bone lesions to suggest metabolically active bone lesions of myeloma. 2. No FDG avid  adenopathy or solid organ metastasis. 3. Aortic Atherosclerosis (ICD10-I70.0). Coronary artery calcifications."  Lab results today 10/02/2020 of CBC w/diff and CMP is as follows: all values are WNL except for  RBC of 3.61, Hgb of 9.2, HCT of 29.1, Mch of 25.5. CMP in progress. 10/02/2020 MMP in progress. 10/02/2020 Beta 2 microglobulin in progress.  On review of systems, pt denies decreased appetite, muscle cramping, difficulty sleeping, back pain, abdominal pain, leg swelling and any other symptoms.  MEDICAL HISTORY:  Past Medical History:  Diagnosis Date  . Dementia ([place]) 03/28/2019  . DM (diabetes mellitus), type 2, uncontrolled ([place]) 2015   Has poor vision.  Family not aware of A1C or what sugars run.  Believe not well controlled.  Not able to monitor due to cost of monitoring equipment  . Hypertension 2015    SURGICAL HISTORY: No past surgical history on file.  SOCIAL HISTORY: Social History   Socioeconomic History  . Marital status: Married    Spouse name: [name]  . Number of children: 7  . Years of education: 3  . Highest education level: 3rd grade  Occupational History  . Occupation: Retired from farming in [place]  Tobacco Use  . Smoking status: Never Smoker  . Smokeless tobacco: Never Used  Vaping Use  . Vaping Use: Never used  Substance and Sexual Activity  . Alcohol use: Never  . Drug use: Never  . Sexual activity: Not on file  Other Topics Concern  . Not on file  Social History Narrative  Came to [place] in [place]   Lives at home with husband   Her 2 daughters, 2 sons and their families   All together, 33 people in the home   Social Determinants of Health   Financial Resource Strain: Not on file  Food Insecurity: Not on file  Transportation Needs: Not on file  Physical Activity: Not on file  Stress: Not on file  Social Connections: Not on file  Intimate Partner Violence: Not on file    FAMILY HISTORY: Family History  Problem Relation Age of  Onset  . Diabetes Mellitus II Neg Hx     ALLERGIES:  has No Known Allergies.  MEDICATIONS:  Current Outpatient Medications  Medication Sig Dispense Refill  . acetaminophen (TYLENOL) 500 MG tablet Take 1,000 mg by mouth every 6 (six) hours as needed for mild pain or fever.    Marland Kitchen amLODipine (NORVASC) 5 MG tablet Take 1 tablet (5 mg total) by mouth daily. 30 tablet 11  . ASPIRIN LOW DOSE 81 MG EC tablet Take 81 mg by mouth daily.     . FEROSUL 325 (65 Fe) MG tablet Take 325 mg by mouth daily.    Marland Kitchen gabapentin (NEURONTIN) 300 MG capsule Take 300 mg by mouth at bedtime.    Marland Kitchen glimepiride (AMARYL) 1 MG tablet Take 1 tablet (1 mg total) by mouth daily with breakfast. 30 tablet 11  . losartan (COZAAR) 25 MG tablet Take 25 mg by mouth daily.    Marland Kitchen losartan (COZAAR) 50 MG tablet Take 50 mg by mouth daily.    . meloxicam (MOBIC) 7.5 MG tablet Take 1 tablet (7.5 mg total) by mouth daily. 30 tablet 6  . metFORMIN (GLUCOPHAGE) 1000 MG tablet Take 1 tablet (1,000 mg total) by mouth 2 (two) times daily with a meal. 60 tablet 11  . metoprolol tartrate (LOPRESSOR) 100 MG tablet Take 1 tablet (100 mg total) by mouth 2 (two) times daily. 60 tablet 11  . rosuvastatin (CRESTOR) 20 MG tablet Take 20 mg by mouth daily.     No current facility-administered medications for this visit.    REVIEW OF SYSTEMS:   10 Point review of Systems was done is negative except as noted above.  PHYSICAL EXAMINATION: ECOG PERFORMANCE STATUS: 1 - Symptomatic but completely ambulatory  . Vitals:   10/02/20 1520  BP: (!) 167/84  Pulse: 86  Resp: 18  Temp: 97.9 F (36.6 C)  SpO2: 100%   Filed Weights   10/02/20 1520  Weight: 141 lb 1.6 oz (64 kg)   .Body mass index is 26.66 kg/m.  GENERAL:alert, in no acute distress and comfortable SKIN: no acute rashes, no significant lesions EYES: conjunctiva are pink and non-injected, sclera anicteric OROPHARYNX: MMM, no exudates, no oropharyngeal erythema or ulceration NECK:  supple, no JVD LYMPH:  no palpable lymphadenopathy in the cervical, axillary or inguinal regions LUNGS: clear to auscultation b/l with normal respiratory effort HEART: regular rate & rhythm ABDOMEN:  normoactive bowel sounds , non tender, not distended. Extremity: no pedal edema PSYCH: alert & oriented x 3 with fluent speech NEURO: no focal motor/sensory deficits  LABORATORY DATA:  I have reviewed the data as listed  . CBC Latest Ref Rng & Units 10/02/2020 09/24/2020 08/27/2020  WBC 4.0 - 10.5 K/uL 7.8 6.1 8.8  Hemoglobin 12.0 - 15.0 g/dL 9.2(L) 9.5(L) 10.0(L)  Hematocrit 36.0 - 46.0 % 29.1(L) 29.8(L) 29.9(L)  Platelets 150 - 400 K/uL 232 270 271    . CMP Latest Ref Rng &  Units 10/02/2020 09/24/2020 08/27/2020  Glucose 70 - 99 mg/dL 321(H) 289(H) 297(H)  BUN 8 - 23 mg/dL 25(H) 19 15  Creatinine 0.44 - 1.00 mg/dL 2.01(H) 0.95 1.45(H)  Sodium 135 - 145 mmol/L 134(L) 136 137  Potassium 3.5 - 5.1 mmol/L 4.2 4.1 3.9  Chloride 98 - 111 mmol/L 100 101 103  CO2 22 - 32 mmol/L 23 25 26   Calcium 8.9 - 10.3 mg/dL 9.0 9.0 9.3  Total Protein 6.5 - 8.1 g/dL 8.1 - 8.7(H)  Total Bilirubin 0.3 - 1.2 mg/dL 0.3 - 0.5  Alkaline Phos 38 - 126 U/L 45 - 47  AST 15 - 41 U/L 11(L) - 10(L)  ALT 0 - 44 U/L 11 - 13   . No results found for: IRON, TIBC, IRONPCTSAT (Iron and TIBC)  Lab Results  Component Value Date   FERRITIN 1,690 (H) 07/22/2019      RADIOGRAPHIC STUDIES: I have personally reviewed the radiological images as listed and agreed with the findings in the report. NM PET Image Initial (PI) Whole Body  Result Date: 09/28/2020 CLINICAL DATA:  Initial treatment strategy for myeloma. EXAM: NUCLEAR MEDICINE PET WHOLE BODY TECHNIQUE: 6.4 mCi F-18 FDG was injected intravenously. Full-ring PET imaging was performed from the head to foot after the radiotracer. CT data was obtained and used for attenuation correction and anatomic localization. Fasting blood glucose: 286 mg/dl COMPARISON:  None.  FINDINGS: Mediastinal blood pool activity: SUV max 2.9 HEAD/NECK: No hypermetabolic activity in the scalp. No hypermetabolic cervical lymph nodes. Incidental CT findings: Retention cyst versus polyp is identified in the right maxillary sinus. CHEST: No hypermetabolic mediastinal or hilar nodes. No suspicious pulmonary nodules on the CT scan. Incidental CT findings: Interstitial reticulation, ground-glass attenuation, and volume loss is noted within both lower lobes, likely postinflammatory/infectious in etiology. No suspicious lung nodules. ABDOMEN/PELVIS: No abnormal hypermetabolic activity within the liver, pancreas, adrenal glands, or spleen. No hypermetabolic lymph nodes in the abdomen or pelvis. Incidental CT findings: Aortic atherosclerosis. No aneurysm. Coronary artery calcifications. None no abdominopelvic adenopathy. SKELETON: No focal hypermetabolic activity to suggest skeletal metastasis. Incidental CT findings: none EXTREMITIES: No abnormal hypermetabolic activity in the lower extremities. Incidental CT findings: none IMPRESSION: 1. No FDG avid bone lesions to suggest metabolically active bone lesions of myeloma. 2. No FDG avid adenopathy or solid organ metastasis. 3. Aortic Atherosclerosis (ICD10-I70.0). Coronary artery calcifications. Electronically Signed   By: Kerby Moors M.D.   On: 09/28/2020 15:09   CT Biopsy  Result Date: 09/24/2020 INDICATION: Monoclonal paraproteinemia, concern for myeloma EXAM: CT GUIDED RIGHT ILIAC BONE MARROW ASPIRATION AND CORE BIOPSY Date:  09/24/2020 09/24/2020 9:49 am Radiologist:  M. Daryll Brod, MD Guidance:  CT FLUOROSCOPY TIME:  Fluoroscopy Time: None. MEDICATIONS: 1% lidocaine local ANESTHESIA/SEDATION: 2.0 mg IV Versed; 100 mcg IV Fentanyl Moderate Sedation Time:  10 minutes The patient was continuously monitored during the procedure by the interventional radiology nurse under my direct supervision. CONTRAST:  None. COMPLICATIONS: None PROCEDURE: Informed consent  was obtained from the patient following explanation of the procedure, risks, benefits and alternatives. The patient understands, agrees and consents for the procedure. All questions were addressed. A time out was performed. The patient was positioned prone and non-contrast localization CT was performed of the pelvis to demonstrate the iliac marrow spaces. Maximal barrier sterile technique utilized including caps, mask, sterile gowns, sterile gloves, large sterile drape, hand hygiene, and Betadine prep. Under sterile conditions and local anesthesia, an 11 gauge coaxial bone biopsy needle was advanced into  the right iliac marrow space. Needle position was confirmed with CT imaging. Initially, bone marrow aspiration was performed. Next, the 11 gauge outer cannula was utilized to obtain a right iliac bone marrow core biopsy. Needle was removed. Hemostasis was obtained with compression. The patient tolerated the procedure well. Samples were prepared with the cytotechnologist. No immediate complications. IMPRESSION: CT guided right iliac bone marrow aspiration and core biopsy. Electronically Signed   By: Jerilynn Mages.  Shick M.D.   On: 09/24/2020 10:24   CT BONE MARROW BIOPSY & ASPIRATION  Result Date: 09/24/2020 INDICATION: Monoclonal paraproteinemia, concern for myeloma EXAM: CT GUIDED RIGHT ILIAC BONE MARROW ASPIRATION AND CORE BIOPSY Date:  09/24/2020 09/24/2020 9:49 am Radiologist:  M. Daryll Brod, MD Guidance:  CT FLUOROSCOPY TIME:  Fluoroscopy Time: None. MEDICATIONS: 1% lidocaine local ANESTHESIA/SEDATION: 2.0 mg IV Versed; 100 mcg IV Fentanyl Moderate Sedation Time:  10 minutes The patient was continuously monitored during the procedure by the interventional radiology nurse under my direct supervision. CONTRAST:  None. COMPLICATIONS: None PROCEDURE: Informed consent was obtained from the patient following explanation of the procedure, risks, benefits and alternatives. The patient understands, agrees and consents for the  procedure. All questions were addressed. A time out was performed. The patient was positioned prone and non-contrast localization CT was performed of the pelvis to demonstrate the iliac marrow spaces. Maximal barrier sterile technique utilized including caps, mask, sterile gowns, sterile gloves, large sterile drape, hand hygiene, and Betadine prep. Under sterile conditions and local anesthesia, an 11 gauge coaxial bone biopsy needle was advanced into the right iliac marrow space. Needle position was confirmed with CT imaging. Initially, bone marrow aspiration was performed. Next, the 11 gauge outer cannula was utilized to obtain a right iliac bone marrow core biopsy. Needle was removed. Hemostasis was obtained with compression. The patient tolerated the procedure well. Samples were prepared with the cytotechnologist. No immediate complications. IMPRESSION: CT guided right iliac bone marrow aspiration and core biopsy. Electronically Signed   By: Jerilynn Mages.  Shick M.D.   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 Chronic anemia. Other labs in progress. -Discussed pt NM PET Whole Body ([id number]) on 09/28/2020; no evidence of disease found. 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 2% is variable difference, but technically MGUS. -Discussed pt's chronic anemia.  -Advised pt there is no indication for treatment for patients MGUS at this time -Advised pt to continue to adjust diabetes medicine to protect kidneys, lower blood sugars, and improve blood counts. -Will start OTC Iron. Start at once daily. -Recommend pt check blood sugar at home  regularly. Advised pt we want blood sugars between 100-200. -Recommend pt maintain consistent eating schedule to help control sugars even better to avoid high spikes following the sugar drops. -Recommend pt drink >2L water daily. -Rx Iron Polysaccharide.  -Will see back in 4 months with labs one week prior.   FOLLOW UP: RTC with Dr Irene Limbo with labs in 4 months. Plz schedule these labs 1 week prior to clinic visit  All of the patients questions were answered with apparent satisfaction. The patient knows to call the clinic with any problems,  questions or concerns.  The total time spent in the appointment was 20 minutes and more than 50% was on counseling and direct patient cares.    Sullivan Lone MD [place] [facility]IV[facility] Hematology/Oncology Physician [facility]  (Office):       [phone] (Work cell):  [phone] (Fax):           [phone]  10/01/2020 4:06 PM  I, Reinaldo Raddle, am acting as scribe for Dr. Sullivan Lone, MD.  .I have reviewed the above documentation for accuracy and completeness, and I agree with the above. [name] MD

## 2020-10-02 ENCOUNTER — Other Ambulatory Visit: Payer: Self-pay

## 2020-10-02 ENCOUNTER — Other Ambulatory Visit: Payer: Self-pay | Admitting: Hematology

## 2020-10-02 ENCOUNTER — Inpatient Hospital Stay (HOSPITAL_BASED_OUTPATIENT_CLINIC_OR_DEPARTMENT_OTHER): Payer: Medicaid Other | Admitting: Hematology

## 2020-10-02 ENCOUNTER — Inpatient Hospital Stay: Payer: Medicaid Other | Attending: Hematology

## 2020-10-02 ENCOUNTER — Encounter (HOSPITAL_COMMUNITY): Payer: Self-pay | Admitting: Hematology

## 2020-10-02 VITALS — BP 167/84 | HR 86 | Temp 97.9°F | Resp 18 | Ht 61.0 in | Wt 141.1 lb

## 2020-10-02 DIAGNOSIS — E1122 Type 2 diabetes mellitus with diabetic chronic kidney disease: Secondary | ICD-10-CM | POA: Diagnosis not present

## 2020-10-02 DIAGNOSIS — I129 Hypertensive chronic kidney disease with stage 1 through stage 4 chronic kidney disease, or unspecified chronic kidney disease: Secondary | ICD-10-CM | POA: Diagnosis not present

## 2020-10-02 DIAGNOSIS — D472 Monoclonal gammopathy: Secondary | ICD-10-CM | POA: Insufficient documentation

## 2020-10-02 DIAGNOSIS — N189 Chronic kidney disease, unspecified: Secondary | ICD-10-CM | POA: Diagnosis not present

## 2020-10-02 DIAGNOSIS — C9000 Multiple myeloma not having achieved remission: Secondary | ICD-10-CM

## 2020-10-02 LAB — CBC WITH DIFFERENTIAL/PLATELET
Abs Immature Granulocytes: 0.03 10*3/uL (ref 0.00–0.07)
Basophils Absolute: 0 10*3/uL (ref 0.0–0.1)
Basophils Relative: 1 %
Eosinophils Absolute: 0.2 10*3/uL (ref 0.0–0.5)
Eosinophils Relative: 2 %
HCT: 29.1 % — ABNORMAL LOW (ref 36.0–46.0)
Hemoglobin: 9.2 g/dL — ABNORMAL LOW (ref 12.0–15.0)
Immature Granulocytes: 0 %
Lymphocytes Relative: 35 %
Lymphs Abs: 2.7 10*3/uL (ref 0.7–4.0)
MCH: 25.5 pg — ABNORMAL LOW (ref 26.0–34.0)
MCHC: 31.6 g/dL (ref 30.0–36.0)
MCV: 80.6 fL (ref 80.0–100.0)
Monocytes Absolute: 0.5 10*3/uL (ref 0.1–1.0)
Monocytes Relative: 7 %
Neutro Abs: 4.2 10*3/uL (ref 1.7–7.7)
Neutrophils Relative %: 55 %
Platelets: 232 10*3/uL (ref 150–400)
RBC: 3.61 MIL/uL — ABNORMAL LOW (ref 3.87–5.11)
RDW: 14.7 % (ref 11.5–15.5)
WBC: 7.8 10*3/uL (ref 4.0–10.5)
nRBC: 0 % (ref 0.0–0.2)

## 2020-10-02 LAB — CMP (CANCER CENTER ONLY)
ALT: 11 U/L (ref 0–44)
AST: 11 U/L — ABNORMAL LOW (ref 15–41)
Albumin: 3.6 g/dL (ref 3.5–5.0)
Alkaline Phosphatase: 45 U/L (ref 38–126)
Anion gap: 11 (ref 5–15)
BUN: 25 mg/dL — ABNORMAL HIGH (ref 8–23)
CO2: 23 mmol/L (ref 22–32)
Calcium: 9 mg/dL (ref 8.9–10.3)
Chloride: 100 mmol/L (ref 98–111)
Creatinine: 2.01 mg/dL — ABNORMAL HIGH (ref 0.44–1.00)
GFR, Estimated: 27 mL/min — ABNORMAL LOW (ref >60)
Glucose, Bld: 321 mg/dL — ABNORMAL HIGH (ref 70–99)
Potassium: 4.2 mmol/L (ref 3.5–5.1)
Sodium: 134 mmol/L — ABNORMAL LOW (ref 135–145)
Total Bilirubin: 0.3 mg/dL (ref 0.3–1.2)
Total Protein: 8.1 g/dL (ref 6.5–8.1)

## 2020-10-02 NOTE — Progress Notes (Unsigned)
bc

## 2020-10-03 ENCOUNTER — Encounter (HOSPITAL_COMMUNITY): Payer: Self-pay | Admitting: Hematology

## 2020-10-03 LAB — BETA 2 MICROGLOBULIN, SERUM: Beta-2 Microglobulin: 3.2 mg/L — ABNORMAL HIGH (ref 0.6–2.4)

## 2020-10-03 LAB — SAMPLE TO BLOOD BANK

## 2020-10-05 LAB — MULTIPLE MYELOMA PANEL, SERUM
Albumin SerPl Elph-Mcnc: 3.4 g/dL (ref 2.9–4.4)
Albumin/Glob SerPl: 0.9 (ref 0.7–1.7)
Alpha 1: 0.2 g/dL (ref 0.0–0.4)
Alpha2 Glob SerPl Elph-Mcnc: 0.9 g/dL (ref 0.4–1.0)
B-Globulin SerPl Elph-Mcnc: 0.9 g/dL (ref 0.7–1.3)
Gamma Glob SerPl Elph-Mcnc: 2.1 g/dL — ABNORMAL HIGH (ref 0.4–1.8)
Globulin, Total: 4.1 g/dL — ABNORMAL HIGH (ref 2.2–3.9)
IgA: 184 mg/dL (ref 87–352)
IgG (Immunoglobin G), Serum: 2246 mg/dL — ABNORMAL HIGH (ref 586–1602)
IgM (Immunoglobulin M), Srm: 73 mg/dL (ref 26–217)
M Protein SerPl Elph-Mcnc: 1.5 g/dL — ABNORMAL HIGH
Total Protein ELP: 7.5 g/dL (ref 6.0–8.5)

## 2020-12-17 ENCOUNTER — Telehealth: Payer: Self-pay | Admitting: Hematology

## 2020-12-17 NOTE — Telephone Encounter (Signed)
R/s per12/23 los, Calendar pritned

## 2021-01-15 ENCOUNTER — Other Ambulatory Visit: Payer: Medicaid Other

## 2021-01-31 ENCOUNTER — Other Ambulatory Visit: Payer: Medicaid Other

## 2021-02-07 ENCOUNTER — Ambulatory Visit: Payer: Medicaid Other | Admitting: Hematology

## 2021-02-14 ENCOUNTER — Inpatient Hospital Stay: Payer: Medicaid Other | Attending: Hematology

## 2021-02-14 ENCOUNTER — Other Ambulatory Visit: Payer: Self-pay

## 2021-02-14 DIAGNOSIS — C9000 Multiple myeloma not having achieved remission: Secondary | ICD-10-CM

## 2021-02-14 LAB — CBC WITH DIFFERENTIAL (CANCER CENTER ONLY)
Abs Immature Granulocytes: 0 10*3/uL (ref 0.00–0.07)
Basophils Absolute: 0 10*3/uL (ref 0.0–0.1)
Basophils Relative: 1 %
Eosinophils Absolute: 0.2 10*3/uL (ref 0.0–0.5)
Eosinophils Relative: 3 %
HCT: 30.6 % — ABNORMAL LOW (ref 36.0–46.0)
Hemoglobin: 9.8 g/dL — ABNORMAL LOW (ref 12.0–15.0)
Immature Granulocytes: 0 %
Lymphocytes Relative: 38 %
Lymphs Abs: 2.2 10*3/uL (ref 0.7–4.0)
MCH: 26 pg (ref 26.0–34.0)
MCHC: 32 g/dL (ref 30.0–36.0)
MCV: 81.2 fL (ref 80.0–100.0)
Monocytes Absolute: 0.4 10*3/uL (ref 0.1–1.0)
Monocytes Relative: 8 %
Neutro Abs: 2.9 10*3/uL (ref 1.7–7.7)
Neutrophils Relative %: 50 %
Platelet Count: 234 10*3/uL (ref 150–400)
RBC: 3.77 MIL/uL — ABNORMAL LOW (ref 3.87–5.11)
RDW: 14.5 % (ref 11.5–15.5)
WBC Count: 5.7 10*3/uL (ref 4.0–10.5)
nRBC: 0 % (ref 0.0–0.2)

## 2021-02-14 LAB — CMP (CANCER CENTER ONLY)
ALT: 8 U/L (ref 0–44)
AST: 11 U/L — ABNORMAL LOW (ref 15–41)
Albumin: 3.5 g/dL (ref 3.5–5.0)
Alkaline Phosphatase: 42 U/L (ref 38–126)
Anion gap: 7 (ref 5–15)
BUN: 21 mg/dL (ref 8–23)
CO2: 27 mmol/L (ref 22–32)
Calcium: 9.1 mg/dL (ref 8.9–10.3)
Chloride: 103 mmol/L (ref 98–111)
Creatinine: 1.32 mg/dL — ABNORMAL HIGH (ref 0.44–1.00)
GFR, Estimated: 45 mL/min — ABNORMAL LOW (ref >60)
Glucose, Bld: 259 mg/dL — ABNORMAL HIGH (ref 70–99)
Potassium: 4.5 mmol/L (ref 3.5–5.1)
Sodium: 137 mmol/L (ref 135–145)
Total Bilirubin: 0.4 mg/dL (ref 0.3–1.2)
Total Protein: 8.1 g/dL (ref 6.5–8.1)

## 2021-02-15 LAB — KAPPA/LAMBDA LIGHT CHAINS
Kappa free light chain: 38.5 mg/L — ABNORMAL HIGH (ref 3.3–19.4)
Kappa, lambda light chain ratio: 2.02 — ABNORMAL HIGH (ref 0.26–1.65)
Lambda free light chains: 19.1 mg/L (ref 5.7–26.3)

## 2021-02-15 LAB — BETA 2 MICROGLOBULIN, SERUM: Beta-2 Microglobulin: 2.4 mg/L (ref 0.6–2.4)

## 2021-02-19 LAB — MULTIPLE MYELOMA PANEL, SERUM
Albumin SerPl Elph-Mcnc: 3.8 g/dL (ref 2.9–4.4)
Albumin/Glob SerPl: 1 (ref 0.7–1.7)
Alpha 1: 0.2 g/dL (ref 0.0–0.4)
Alpha2 Glob SerPl Elph-Mcnc: 0.7 g/dL (ref 0.4–1.0)
B-Globulin SerPl Elph-Mcnc: 0.9 g/dL (ref 0.7–1.3)
Gamma Glob SerPl Elph-Mcnc: 2.1 g/dL — ABNORMAL HIGH (ref 0.4–1.8)
Globulin, Total: 3.9 g/dL (ref 2.2–3.9)
IgA: 189 mg/dL (ref 87–352)
IgG (Immunoglobin G), Serum: 2410 mg/dL — ABNORMAL HIGH (ref 586–1602)
IgM (Immunoglobulin M), Srm: 87 mg/dL (ref 26–217)
M Protein SerPl Elph-Mcnc: 1.8 g/dL — ABNORMAL HIGH
Total Protein ELP: 7.7 g/dL (ref 6.0–8.5)

## 2021-02-20 NOTE — Progress Notes (Signed)
HEMATOLOGY/ONCOLOGY CONSULTATION NOTE  Date of Service: 02/21/2021  Patient Care Team: Trey Sailors, PA as PCP - General (Physician Assistant) [name], MD (Nephrology) [name], MD as Consulting Physician (Hematology)  CHIEF COMPLAINTS/PURPOSE OF CONSULTATION:  Monoclonal Gammopathy  HISTORY OF PRESENTING ILLNESS:   Jennifer Molina is a wonderful 65 y.o. female who has been referred to [place] by Dr. 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   The pt was seen by nephrology for CKD thought to be related to uncontrolled HTN, uncontrolled DM2 and regular use of NSAIDS (meloxicam). Workup for chronic kidney disease done by Dr [name] showed monoclonal paraproteinemia with M spike of 1.7g/dl , with mild anemia.  Most recent lab results (07/31/2020) of CBC is as follows: all values are WNL except for Hgb at 9.7, HCT at 29.9, MCV at 78, MCH at 25.4, Glucose at 267, Creatinine at 1.12, GFR Est Non Af Am at 51.  07/31/2020 SPEP shows all values are WNL except VOJ:[id number] Globulin at 2.2, M-spike at 1.7, Total Globulin at 4.3 07/31/2020 Kappa light chains at 39.5, Lambda light chains at 17.0, K/L light chain ratio at 2.32  Patient notes no acute new symptoms. No focal bone pain. Mild fatigue. Notes poorly controlled HTN and DM2 but that she is working on controlling these.  Interval History  Jennifer Molina is a wonderful 65 y.o. female who is here today for evaluation and management of monoclonal gammopathy.The patient's last visit with [place] was on 10/02/2020. The pt reports that she is doing well overall. We are joined today by her interpretor and daughter.  The pt reports that her leg has been bothering her more lately and causing some pain. She has been staying very active and working a lot. Her appetite has been very good. She has not been checking her blood sugars at home. The patient notes her PCP refuses to take her due to her insurance and will not  manage her medications or any care without coming in, but she is being refused. Her blood sugars and her heart rate have been very uncontrolled. She has been skipping and alternating medicines due to trying to make them last without being able to get them. The pt's daughter believes she is taking the ferrous sulfate, but they are not completely sure.   Lab results 02/14/2021 of CBC w/diff and CMP is as follows: all values are WNL except for RBC of 3.77, Hgb of 9.8, HCT of 30.6, Glucose of 259, Creatinine of 1.32, AST of 11, GFR est of 45. 02/14/2021 Beta-2-Microglobulin of 2.4. 02/14/2021 MMP WNL except IgG of 2410, Gamma Glob of 2.1, ,m-protein of 1.8.(stable) 02/14/2021 kappa free light chain of 38.5, Kappa Lambda ratio of 2.02.  On review of systems, pt reports intermittent chills, intermittent headaches, memory loss, intermittent sweating and denies chest pain, SOB, back pain, abdominal pain, leg swelling, and any other symptoms.   MEDICAL HISTORY:  Past Medical History:  Diagnosis Date   Dementia ([place]) 03/28/2019   DM (diabetes mellitus), type 2, uncontrolled ([place]) 2015   Has poor vision.  Family not aware of A1C or what sugars run.  Believe not well controlled.  Not able to monitor due to cost of monitoring equipment   Hypertension 2015    SURGICAL HISTORY: No past surgical history on file.  SOCIAL HISTORY: Social History   Socioeconomic History   Marital status: Married    Spouse name: [name]   Number of children: 7  Years of education: 3   Highest education level: 3rd grade  Occupational History   Occupation: Retired from farming in [place]  Tobacco Use   Smoking status: Never   Smokeless tobacco: Never  Vaping Use   Vaping Use: Never used  Substance and Sexual Activity   Alcohol use: Never   Drug use: Never   Sexual activity: Not on file  Other Topics Concern   Not on file  Social History Narrative   C[place] to [place] in [place]   Lives at home with husband   Her 2  daughters, 2 sons and their families   All together, 110 people in the home   Social Determinants of Health   Financial Resource Strain: Not on file  Food Insecurity: Not on file  Transportation Needs: Not on file  Physical Activity: Not on file  Stress: Not on file  Social Connections: Not on file  Intimate Partner Violence: Not on file    FAMILY HISTORY: Family History  Problem Relation Age of Onset   Diabetes Mellitus II Neg Hx     ALLERGIES:  has No Known Allergies.  MEDICATIONS:  Current Outpatient Medications  Medication Sig Dispense Refill   acetaminophen (TYLENOL) 500 MG tablet Take 1,000 mg by mouth every 6 (six) hours as needed for mild pain or fever.     amLODipine (NORVASC) 5 MG tablet Take 1 tablet (5 mg total) by mouth daily. 30 tablet 2   ASPIRIN LOW DOSE 81 MG EC tablet Take 1 tablet (81 mg total) by mouth daily. 30 tablet 2   FEROSUL 325 (65 Fe) MG tablet Take 1 tablet (325 mg total) by mouth daily. 30 tablet 5   gabapentin (NEURONTIN) 300 MG capsule Take 1 capsule (300 mg total) by mouth at bedtime. 30 capsule 2   glimepiride (AMARYL) 1 MG tablet Take 1 tablet (1 mg total) by mouth daily with breakfast. 30 tablet 2   losartan (COZAAR) 25 MG tablet Take 25 mg by mouth daily.     losartan (COZAAR) 50 MG tablet Take 1 tablet (50 mg total) by mouth daily. 30 tablet 2   meloxicam (MOBIC) 7.5 MG tablet Take 1 tablet (7.5 mg total) by mouth daily. (Patient not taking: Reported on 02/21/2021) 30 tablet 6   metFORMIN (GLUCOPHAGE) 1000 MG tablet Take 1 tablet (1,000 mg total) by mouth 2 (two) times daily with a meal. 60 tablet 2   metoprolol tartrate (LOPRESSOR) 100 MG tablet Take 1 tablet (100 mg total) by mouth 2 (two) times daily. 60 tablet 2   rosuvastatin (CRESTOR) 20 MG tablet Take 1 tablet (20 mg total) by mouth daily. 30 tablet 2   No current facility-administered medications for this visit.    REVIEW OF SYSTEMS:   10 Point review of Systems was done is negative  except as noted above.  PHYSICAL EXAMINATION: ECOG PERFORMANCE STATUS: 1 - Symptomatic but completely ambulatory  . Vitals:   02/21/21 0928  Pulse: 73  Resp: 18  Temp: 98 F (36.7 C)  SpO2: 100%    Filed Weights   02/21/21 0928  Weight: 148 lb 3 oz (67.2 kg)    .Body mass index is 28 kg/m.   GENERAL:alert, in no acute distress and comfortable SKIN: no acute rashes, no significant lesions EYES: conjunctiva are pink and non-injected, sclera anicteric OROPHARYNX: MMM, no exudates, no oropharyngeal erythema or ulceration NECK: supple, no JVD LYMPH:  no palpable lymphadenopathy in the cervical, axillary or inguinal regions LUNGS: clear to auscultation  b/l with normal respiratory effort HEART: regular rate & rhythm ABDOMEN:  normoactive bowel sounds , non tender, not distended. Extremity: no pedal edema PSYCH: alert & oriented x 3 with fluent speech NEURO: no focal motor/sensory deficits  LABORATORY DATA:  I have reviewed the data as listed  . CBC Latest Ref Rng & Units 02/14/2021 10/02/2020 09/24/2020  WBC 4.0 - 10.5 K/uL 5.7 7.8 6.1  Hemoglobin 12.0 - 15.0 g/dL 9.8(L) 9.2(L) 9.5(L)  Hematocrit 36.0 - 46.0 % 30.6(L) 29.1(L) 29.8(L)  Platelets 150 - 400 K/uL 234 232 270    . CMP Latest Ref Rng & Units 02/14/2021 10/02/2020 09/24/2020  Glucose 70 - 99 mg/dL 259(H) 321(H) 289(H)  BUN 8 - 23 mg/dL 21 25(H) 19  Creatinine 0.44 - 1.00 mg/dL 1.32(H) 2.01(H) 0.95  Sodium 135 - 145 mmol/L 137 134(L) 136  Potassium 3.5 - 5.1 mmol/L 4.5 4.2 4.1  Chloride 98 - 111 mmol/L 103 100 101  CO2 22 - 32 mmol/L 27 23 25   Calcium 8.9 - 10.3 mg/dL 9.1 9.0 9.0  Total Protein 6.5 - 8.1 g/dL 8.1 8.1 -  Total Bilirubin 0.3 - 1.2 mg/dL 0.4 0.3 -  Alkaline Phos 38 - 126 U/L 42 45 -  AST 15 - 41 U/L 11(L) 11(L) -  ALT 0 - 44 U/L 8 11 -   . No results found for: IRON, TIBC, IRONPCTSAT (Iron and TIBC)  Lab Results  Component Value Date   FERRITIN 1,690 (H) 07/22/2019   09/24/2020 Surgical  Pathology DIAGNOSIS:   BONE MARROW, ASPIRATE, CLOT, CORE:  -Slightly hypercellular bone marrow for age with plasma cell neoplasm  -See comment   PERIPHERAL BLOOD:  -Normocytic-normochromic anemia   COMMENT:   The bone marrow is hypercellular for age with slight increase in plasma  cells representing 8% of all cells in the aspirate with lack of large  aggregates or sheets.  Immunohistochemical stains highlight the  increased plasma cell component in the clot/biopsy sections correlating  with the aspirate.  The plasma cells display kappa light chain  restriction consistent with plasma cell neoplasm.  Correlation with  cytogenetic and FISH studies recommended.   09/24/2020 Molecular Pathology    09/24/2020 Cytogenetics Report    RADIOGRAPHIC STUDIES: I have personally reviewed the radiological images as listed and agreed with the findings in the report. No results found.   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  -Advised pt there is no signs of progression from her MGUS. The main concerns right now is her uncontrolled blood pressure and blood sugar. -Advised pt that if continue uncontrolled, this can affect her kidneys or even lead to heart attack. -Discussed availability of pt's current medications. Can give refills until she sees new PCP. She will be seen September 19 by a new practice. -Will connect with social worker to help pt get medicine box and any other assistance needed. -Continue to f/u w Dr. Fransico Meadow at [facility] importance of taking medications regularly, checking sugars and blood pressure every few days. Check sugars first thing in morning  before breakfast or any caffeine. -Advised pt that she must take medicines regularly or she will need to go to the emergency room. The patient  just said she hasnt taken her medicine for over a week. -Advised pt the memory loss issues are most likely due to her uncontrolled sugars and blood pressure potential causing microvascular CNS changes of early vascular dementia. -Recommended aids such as pill box and alarm to take medicines regularly. -Advised pt there is no indication for treatment for patients MGUS at this time -Recommend pt check blood sugar at home regularly. Advised pt we want blood sugars between 100-200. -Recommend pt maintain consistent eating schedule to help control sugars even better to avoid high spikes following the sugar drops. -Recommend pt drink >2L water daily. -Advised pt that if her blood pressure remains above 200 after taking medicine consistently for 3-4 days, then go to the ED. -Will send in refills for pt until September. -Will see back in 4 months with labs 1 week prior.   FOLLOW UP: RTC with Dr Irene Limbo with labs in 4 months. Plz schedule these labs 1 week prior to clinic visit F/u with new PCP as per schedule appointment in september    All of the patients questions were answered with apparent satisfaction. The patient knows to call the clinic with any problems, questions or concerns.  The total time spent in the appointment was 30 minutes and more than 50% was on counseling and direct patient cares.    Sullivan Lone MD [place] [facility]IV[facility] [employer] Hematology/Oncology Physician [facility]  (Office):       [phone] (Work cell):  [phone] (Fax):           [phone]  02/21/2021 10:15 AM  I, Reinaldo Raddle, am acting as scribe for Dr. Sullivan Lone, MD. .I have reviewed the above documentation for accuracy and completeness, and I agree with the above. [name] MD

## 2021-02-21 ENCOUNTER — Other Ambulatory Visit: Payer: Self-pay

## 2021-02-21 ENCOUNTER — Inpatient Hospital Stay: Payer: Medicaid Other | Attending: Hematology | Admitting: Hematology

## 2021-02-21 VITALS — HR 73 | Temp 98.0°F | Resp 18 | Wt 148.2 lb

## 2021-02-21 DIAGNOSIS — D472 Monoclonal gammopathy: Secondary | ICD-10-CM | POA: Diagnosis not present

## 2021-02-21 DIAGNOSIS — N189 Chronic kidney disease, unspecified: Secondary | ICD-10-CM | POA: Insufficient documentation

## 2021-02-21 DIAGNOSIS — I129 Hypertensive chronic kidney disease with stage 1 through stage 4 chronic kidney disease, or unspecified chronic kidney disease: Secondary | ICD-10-CM | POA: Insufficient documentation

## 2021-02-21 DIAGNOSIS — D649 Anemia, unspecified: Secondary | ICD-10-CM | POA: Insufficient documentation

## 2021-02-21 DIAGNOSIS — E119 Type 2 diabetes mellitus without complications: Secondary | ICD-10-CM | POA: Insufficient documentation

## 2021-02-21 MED ORDER — METFORMIN HCL 1000 MG PO TABS: 1000.0000 mg | ORAL_TABLET | Freq: Two times a day (BID) | ORAL | 2 refills | Status: DC

## 2021-02-21 MED ORDER — GLIMEPIRIDE 1 MG PO TABS: 1.0000 mg | ORAL_TABLET | Freq: Every day | ORAL | 2 refills | Status: DC

## 2021-02-21 MED ORDER — AMLODIPINE BESYLATE 5 MG PO TABS: 5.0000 mg | ORAL_TABLET | Freq: Every day | ORAL | 2 refills | Status: DC

## 2021-02-21 MED ORDER — METOPROLOL TARTRATE 100 MG PO TABS: 100.0000 mg | ORAL_TABLET | Freq: Two times a day (BID) | ORAL | 2 refills | Status: DC

## 2021-02-21 MED ORDER — GABAPENTIN 300 MG PO CAPS: 300.0000 mg | ORAL_CAPSULE | Freq: Every day | ORAL | 2 refills | Status: DC

## 2021-02-21 MED ORDER — FEROSUL 325 (65 FE) MG PO TABS: 325.0000 mg | ORAL_TABLET | Freq: Every day | ORAL | 5 refills | Status: DC

## 2021-02-21 MED ORDER — LOSARTAN POTASSIUM 50 MG PO TABS: 50.0000 mg | ORAL_TABLET | Freq: Every day | ORAL | 2 refills | Status: DC

## 2021-02-21 MED ORDER — ROSUVASTATIN CALCIUM 20 MG PO TABS: 20.0000 mg | ORAL_TABLET | Freq: Every day | ORAL | 2 refills | Status: DC

## 2021-02-21 MED ORDER — ASPIRIN LOW DOSE 81 MG PO TBEC: 81.0000 mg | DELAYED_RELEASE_TABLET | Freq: Every day | ORAL | 2 refills | Status: DC

## 2021-02-22 ENCOUNTER — Telehealth: Payer: Self-pay | Admitting: Hematology

## 2021-02-22 NOTE — Telephone Encounter (Signed)
Left message with follow-up appointments per 7/7 los. 

## 2021-05-02 NOTE — Progress Notes (Signed)
Subjective:    Jennifer Molina - 65 y.o. female MRN [id number]  Date of birth: 01/29/1956  HPI  Jennifer Molina is to establish care. She is accompanied by her daughter Jennifer Molina.  Current issues and/or concerns: Feeling tired sometimes. Also forgetful of some things for example while doing chores in the home. Did not get a chance to take her medications today because she was rushing to this appointment.  ROS per HPI    Health Maintenance:  Health Maintenance Due  Topic Date Due   COVID-19 Vaccine (1) Never done   FOOT EXAM  Never done   OPHTHALMOLOGY EXAM  Never done   Hepatitis C Screening  Never done   TETANUS/TDAP  Never done   Zoster Vaccines- Shingrix (1 of 2) Never done   COLONOSCOPY (Pts 45-15yr Insurance coverage will need to be confirmed)  Never done   MAMMOGRAM  Never done   DEXA SCAN  Never done   INFLUENZA VACCINE  03/18/2021     Past Medical History: Patient Active Problem List   Diagnosis Date Noted   Acute respiratory failure with hypoxia ([facility] 07/23/2019   Sepsis due to COVID-19 [place] 07/22/2019   Pneumonia due to COVID-19 virus 07/22/2019   Dementia ([facility] 03/28/2019   Chronic post-traumatic stress disorder (PTSD) 03/28/2019   DM (diabetes mellitus), type 2, uncontrolled ([facility] 2015   Hypertension 2015     Social History   reports that she has never smoked. She has never used smokeless tobacco. She reports that she does not drink alcohol and does not use drugs.   Family History  family history is not on file.   Medications: reviewed and updated   Objective:   Physical Exam BP (!) 173/77 (BP Location: Left Arm, Patient Position: Sitting, Cuff Size: Normal)   Pulse 79   Temp 98.1 F (36.7 C)   Resp 18   Ht 5' 0.98" (1.549 m)   Wt 148 lb 6.4 oz (67.3 kg)   LMP  (LMP Unknown)   SpO2 99%   BMI 28.05 kg/m  Physical Exam HENT:     Head: Normocephalic and atraumatic.  Eyes:     Extraocular Movements: Extraocular movements intact.      Conjunctiva/sclera: Conjunctivae normal.     Pupils: Pupils are equal, round, and reactive to light.  Cardiovascular:     Rate and Rhythm: Normal rate and regular rhythm.     Pulses: Normal pulses.     Heart sounds: Normal heart sounds.  Pulmonary:     Effort: Pulmonary effort is normal.     Breath sounds: Normal breath sounds.  Musculoskeletal:     Cervical back: Normal range of motion and neck supple.  Neurological:     General: No focal deficit present.     Mental Status: She is alert and oriented to person, place, and time.  Psychiatric:        Mood and Affect: Mood normal.        Behavior: Behavior normal.     Results for orders placed or performed in visit on 05/06/21  POCT glycosylated hemoglobin (Hb A1C)  Result Value Ref Range   Hemoglobin A1C 8.2 (A) 4.0 - 5.6 %   HbA1c POC (<> result, manual entry)     HbA1c, POC (prediabetic range)     HbA1c, POC (controlled diabetic range)      Assessment & Plan:  1. Encounter to establish care: - Patient presents today to establish care.  - Return for annual physical examination,  labs, and health maintenance. Arrive fasting meaning having no food for at least 8 hours prior to appointment. You may have only water or black coffee. Please take scheduled medications as normal.  2. Essential hypertension: - Blood pressure not at goal during today's visit. Patient asymptomatic without chest pressure, chest pain, palpitations, shortness of breath, worst headache of life, and any additional red flag symptoms. - Patient reports she did not take blood pressure medications this morning because she was trying to rush to this appointment.  - Continue Amlodipine, Losartan, and Metoprolol as prescribed.  - Counseled on blood pressure goal of less than 140/90, low-sodium, DASH diet, medication compliance, 150 minutes of moderate intensity exercise per week as tolerated. Discussed medication compliance, adverse effects. - Follow-up with primary  provider in 1 week or sooner if needed.  - amLODipine (NORVASC) 5 MG tablet; Take 1 tablet (5 mg total) by mouth daily.  Dispense: 30 tablet; Refill: 2 - losartan (COZAAR) 50 MG tablet; Take 1 tablet (50 mg total) by mouth daily.  Dispense: 30 tablet; Refill: 2 - metoprolol tartrate (LOPRESSOR) 100 MG tablet; Take 1 tablet (100 mg total) by mouth 2 (two) times daily.  Dispense: 60 tablet; Refill: 2  3. Type 2 diabetes mellitus without complication, without long-term current use of insulin ([place]): - Hemoglobin A1c today close to goal at 8.2%, goal < 8%. This is improved from previous hemoglobin A1c of 8.9% on 07/22/2019. Next hemoglobin A1c due December 2022.  - Continue Metformin and Glimepiride as prescribed.  - Discussed the importance of healthy eating habits, low-carbohydrate diet, low-sugar diet, regular aerobic exercise (at least 150 minutes a week as tolerated) and medication compliance to achieve or maintain control of diabetes. - Follow-up with primary provider in 3 months or sooner if needed.  - POCT glycosylated hemoglobin (Hb A1C) - metFORMIN (GLUCOPHAGE) 1000 MG tablet; Take 1 tablet (1,000 mg total) by mouth 2 (two) times daily with a meal.  Dispense: 60 tablet; Refill: 2 - glimepiride (AMARYL) 1 MG tablet; Take 1 tablet (1 mg total) by mouth daily with breakfast.  Dispense: 30 tablet; Refill: 2  4. Alzheimer's dementia without behavioral disturbance, unspecified timing of dementia onset ([place]): - Newly diagnosed 03/28/2019 at [place] visit with Mack Hook, MD.  - Patient not currently on pharmacological therapy.  - Referral to Neurology for further evaluation and management.  - Follow-up with primary provider as scheduled.  - Ambulatory referral to Neurology  5. Language barrier: - Patient accompanied by her daughter Jennifer Molina who serves as part-historian.  - [place] interpreter, Leodis Liverpool, present during today's visit.     Patient was given clear  instructions to go to Emergency Department or return to medical center if symptoms don't improve, worsen, or new problems develop.The patient verbalized understanding.  I discussed the assessment and treatment plan with the patient. The patient was provided an opportunity to ask questions and all were answered. The patient agreed with the plan and demonstrated an understanding of the instructions.   The patient was advised to call back or seek an in-person evaluation if the symptoms worsen or if the condition fails to improve as anticipated.    Durene Fruits, NP 05/06/2021, 9:46 AM Primary Care at [facility]

## 2021-05-06 ENCOUNTER — Ambulatory Visit (INDEPENDENT_AMBULATORY_CARE_PROVIDER_SITE_OTHER): Payer: Medicaid Other | Admitting: Family

## 2021-05-06 ENCOUNTER — Encounter: Payer: Self-pay | Admitting: Family

## 2021-05-06 ENCOUNTER — Other Ambulatory Visit: Payer: Self-pay

## 2021-05-06 VITALS — BP 173/77 | HR 79 | Temp 98.1°F | Resp 18 | Ht 60.98 in | Wt 148.4 lb

## 2021-05-06 DIAGNOSIS — Z789 Other specified health status: Secondary | ICD-10-CM

## 2021-05-06 DIAGNOSIS — I10 Essential (primary) hypertension: Secondary | ICD-10-CM

## 2021-05-06 DIAGNOSIS — Z7689 Persons encountering health services in other specified circumstances: Secondary | ICD-10-CM

## 2021-05-06 DIAGNOSIS — G309 Alzheimer's disease, unspecified: Secondary | ICD-10-CM | POA: Diagnosis not present

## 2021-05-06 DIAGNOSIS — E119 Type 2 diabetes mellitus without complications: Secondary | ICD-10-CM | POA: Diagnosis not present

## 2021-05-06 DIAGNOSIS — F0280 Dementia in other diseases classified elsewhere without behavioral disturbance: Secondary | ICD-10-CM

## 2021-05-06 LAB — POCT GLYCOSYLATED HEMOGLOBIN (HGB A1C): Hemoglobin A1C: 8.2 % — AB (ref 4.0–5.6)

## 2021-05-06 MED ORDER — METOPROLOL TARTRATE 100 MG PO TABS
100.0000 mg | ORAL_TABLET | Freq: Two times a day (BID) | ORAL | 2 refills | Status: DC
Start: 2021-05-06 — End: 2021-06-19

## 2021-05-06 MED ORDER — LOSARTAN POTASSIUM 50 MG PO TABS: 50.0000 mg | ORAL_TABLET | Freq: Every day | ORAL | 2 refills | Status: DC

## 2021-05-06 MED ORDER — METFORMIN HCL 1000 MG PO TABS: 1000.0000 mg | ORAL_TABLET | Freq: Two times a day (BID) | ORAL | 2 refills | Status: DC

## 2021-05-06 MED ORDER — AMLODIPINE BESYLATE 5 MG PO TABS: 5.0000 mg | ORAL_TABLET | Freq: Every day | ORAL | 2 refills | Status: DC

## 2021-05-06 MED ORDER — GLIMEPIRIDE 1 MG PO TABS: 1.0000 mg | ORAL_TABLET | Freq: Every day | ORAL | 2 refills | Status: DC

## 2021-05-06 NOTE — Progress Notes (Signed)
Pt presents to establish care, accompanied by daughter Sherron Flemings pt denies any pain but does have complaints of fatigue

## 2021-06-10 ENCOUNTER — Encounter: Payer: Medicaid Other | Admitting: Family

## 2021-06-11 NOTE — Progress Notes (Signed)
[facility]  PATIENT: Jennifer Molina DOB: 67-08-1955  REFERRING CLINICIAN: [name], NP HISTORY FROM: self and daughter, obtained with aid of [place] interpreter REASON FOR VISIT: memory loss   HISTORICAL  CHIEF COMPLAINT:  Chief Complaint  Patient presents with   Dementia    Treatment rm with daughter/interpreter Lexine Baton (& cone interpreter) Pt is well, daughter states she started loosing memory after having Covid about two years ago, it has gradually worsen recently     HISTORY OF PRESENT ILLNESS:  The patient presents for evaluation of memory loss which has been present over the past 3 years, prior to [place] pandemic. She was hospitalized with COVID 2 years ago and felt memory worsened after that. Thinks it has been a little better recently.   Primary concern is for forgetfulness. For example she will be holding her glasses and spend time looking for them around the house. Will forget her train of thought in the middle of conversations and have trouble with thinking of the word she wants to use. She is generally able to return to her train of thought after pausing for a few moments.  TBI:  No past history of TBI Stroke: no past history of stroke Seizures:  no past history of seizures Sleep:  no history of sleep apnea.  Sleeps well at night Mood: patient denies anxiety and depression. Notes she lost her son recently and feels restless. Tries to keep herself occupied with work so she doesn't have to think about it  Functional status:  Patient lives with her family Cooking: Will leave for work and forget that she left the stove on. 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 They use  stickers to help remember which time/day to take medications. Forgetting loved ones names?: Remembers family members names will forget their birthdays Word finding difficulty? Yes  No issues with walking or balance. Will sometimes get low back pain if she is doing a lot of cleaning.  OTHER MEDICAL CONDITIONS: HTN, DM, MGUS, HLD   REVIEW OF SYSTEMS: Full 14 system review of systems performed and negative with exception of: memory loss  ALLERGIES: No Known Allergies  HOME MEDICATIONS: Outpatient Medications Prior to Visit  Medication Sig Dispense Refill   acetaminophen (TYLENOL) 500 MG tablet Take 1,000 mg by mouth every 6 (six) hours as needed for mild pain or fever.     amLODipine (NORVASC) 5 MG tablet Take 1 tablet (5 mg total) by mouth daily. 30 tablet 2   ASPIRIN LOW DOSE 81 MG EC tablet Take 1 tablet (81 mg total) by mouth daily. 30 tablet 2   FEROSUL 325 (65 Fe) MG tablet Take 1 tablet (325 mg total) by mouth daily. 30 tablet 5   gabapentin (NEURONTIN) 300 MG capsule Take 1 capsule (300 mg total) by mouth at bedtime. 30 capsule 2   glimepiride (AMARYL) 1 MG tablet Take 1 tablet (1 mg total) by mouth daily with breakfast. 30 tablet 2   losartan (COZAAR) 50 MG tablet Take 1 tablet (50 mg total) by mouth daily. 30 tablet 2   metFORMIN (GLUCOPHAGE) 1000 MG tablet Take 1 tablet (1,000 mg total) by mouth 2 (two) times daily with a meal. 60 tablet 2   metoprolol  tartrate (LOPRESSOR) 100 MG tablet Take 1 tablet (100 mg total) by mouth 2 (two) times daily. 60 tablet 2   rosuvastatin (CRESTOR) 20 MG tablet Take 1 tablet (20 mg total) by mouth daily. 30 tablet 2   meloxicam (MOBIC) 7.5 MG tablet Take 1 tablet (7.5 mg total) by mouth daily. (Patient not taking: Reported on 67/26/2022) 30 tablet 6   No facility-administered medications prior to visit.    PAST MEDICAL HISTORY: Past Medical History:  Diagnosis Date   Dementia ([place]) 03/28/2019   DM (diabetes mellitus), type 2, uncontrolled  ([place]) 2015   Has poor vision.  Family not aware of A1C or what sugars run.  Believe not well controlled.  Not able to monitor due to cost of monitoring equipment   Hypertension 2015    PAST SURGICAL HISTORY: No past surgical history on file.  FAMILY HISTORY: Family History  Problem Relation Age of Onset   Diabetes Mellitus II Neg Hx     SOCIAL HISTORY: Social History   Socioeconomic History   Marital status: Married    Spouse name: [name]   Number of children: 7   Years of education: 3   Highest education level: 3rd grade  Occupational History   Occupation: Retired from farming in [place]  Tobacco Use   Smoking status: Never   Smokeless tobacco: Never  Vaping Use   Vaping Use: Never used  Substance and Sexual Activity   Alcohol use: Never   Drug use: Never   Sexual activity: Not on file  Other Topics Concern   Not on file  Social History Narrative   Came to [place] in [place]   Lives at home with husband   Her 2 daughters, 2 sons and their families   All together, 29 people in the home   Social Determinants of Health   Financial Resource Strain: Not on file  Food Insecurity: Not on file  Transportation Needs: Not on file  Physical Activity: Not on file  Stress: Not on file  Social Connections: Not on file  Intimate Partner Violence: Not on file     PHYSICAL EXAM  GENERAL EXAM/CONSTITUTIONAL: Vitals:  Vitals:   06/12/21 1052  BP: (!) 206/81  Pulse: 78  Weight: 152 lb (68.9 kg)  Height: 5\' 3"  (1.6 m)   Body mass index is 26.93 kg/m. Wt Readings from Last 3 Encounters:  06/12/21 152 lb (68.9 kg)  05/06/21 148 lb 6.4 oz (67.3 kg)  02/21/21 148 lb 3 oz (67.2 kg)   Patient is in no distress; well developed, nourished and groomed; neck is supple  CARDIOVASCULAR: Examination of peripheral vascular system by observation and palpation is normal  EYES: Pupils round and reactive to light, Visual fields full to confrontation, Extraocular movements  intacts  MUSCULOSKELETAL: Gait, strength, tone, movements noted in Neurologic exam below  NEUROLOGIC: MENTAL STATUS:  MMSE - [place] Exam 06/12/2021  Orientation to time 3  Orientation to Place 2  Registration 3  Attention/ Calculation 0  Recall 1  Language- name 2 objects 2  Language- repeat 0  Language- follow 3 step command 3  Language- read & follow direction 1  Write a sentence 1  Copy design 1  Total score 17    CRANIAL NERVE:  2nd, 3rd, 4th, 6th - pupils equal and reactive to light, visual fields full to confrontation, extraocular muscles intact, no nystagmus 5th - facial sensation symmetric 7th - facial strength symmetric 8th - hearing intact 9th - palate elevates  symmetrically, uvula midline 11th - shoulder shrug symmetric 12th - tongue protrusion midline  MOTOR:  normal bulk and tone, no cogwheeling, full strength in the BUE, BLE  SENSORY:  normal and symmetric to light touch all 4 extremities  COORDINATION:  finger-nose-finger, fine finger movements normal, no tremor  REFLEXES:  1+ throughout  GAIT/STATION:  normal     DIAGNOSTIC DATA (LABS, IMAGING, TESTING) - I reviewed patient records, labs, notes, testing and imaging myself where available.  Lab Results  Component Value Date   WBC 5.7 02/14/2021   HGB 9.8 (L) 02/14/2021   HCT 30.6 (L) 02/14/2021   MCV 81.2 02/14/2021   PLT 234 02/14/2021      Component Value Date/Time   NA 137 02/14/2021 1211   NA 140 03/28/2019 1143   K 4.5 02/14/2021 1211   CL 103 02/14/2021 1211   CO2 27 02/14/2021 1211   GLUCOSE 259 (H) 02/14/2021 1211   BUN 21 02/14/2021 1211   BUN 14 03/28/2019 1143   CREATININE 1.32 (H) 02/14/2021 1211   CALCIUM 9.1 02/14/2021 1211   PROT 8.1 02/14/2021 1211   PROT 8.7 (H) 03/28/2019 1143   ALBUMIN 3.5 02/14/2021 1211   ALBUMIN 4.6 03/28/2019 1143   AST 11 (L) 02/14/2021 1211   ALT 8 02/14/2021 1211   ALKPHOS 42 02/14/2021 1211   BILITOT 0.4 02/14/2021 1211    GFRNONAA 45 (L) 02/14/2021 1211   GFRAA >60 07/25/2019 0412   Lab Results  Component Value Date   CHOL 222 (H) 03/28/2019   HDL 40 03/28/2019   LDLCALC 104 (H) 03/28/2019   TRIG 389 (H) 03/28/2019   Lab Results  Component Value Date   HGBA1C 8.2 (A) 05/06/2021   No results found for: [id number] Lab Results  Component Value Date   TSH 3.080 03/28/2019      ASSESSMENT AND PLAN  65 y.o. year old female with a history of HTN, DM, MGUS, HLD who presents for evaluation of memory loss over the past 3 years. Memory worsened after COVID infection but has slightly improved since then. Her MMSE is 17, which is suggestive of moderate cognitive dysfunction. This score may be confounded by the language barrier as she does not speak [place] and requires an interpreter. However her memory issues are impacting her ADLs, particularly with cooking and leaving the stove on. Will test for reversible causes of memory loss and order MRI brain to assess for signs of neurodegeneration and/or significant vascular disease. If no secondary causes found will consider starting a medication like donepezil to help slow progress of memory loss. Offered referral to cognitive rehab, but patient and daughter are not sure they would be able to work it into their schedule at this time  BP elevated at today's appointment. She has an appointment with her PCP on 11/2 and can discuss BP management at that time  1. Memory loss       PLAN: - Labs: TSH, B12 - MRI brain with contrast - Follow up after testing is complete.   Orders Placed This Encounter  Procedures   MR BRAIN W WO CONTRAST   TSH   Vitamin B12     Return after testing.  I spent an average of 44 minutes chart reviewing and counseling the patient, with at least 50% of the time face to face with the patient. General brain health measures discussed, including the importance of regular aerobic exercise. Reviewed safety measures including driving  safety.   Genia Harold, MD 06/12/21  11:57 AM   [facility] [address], [place], [place] [address] [phone]

## 2021-06-12 ENCOUNTER — Telehealth: Payer: Self-pay | Admitting: Psychiatry

## 2021-06-12 ENCOUNTER — Encounter: Payer: Self-pay | Admitting: Psychiatry

## 2021-06-12 ENCOUNTER — Ambulatory Visit: Payer: Medicaid Other | Admitting: Psychiatry

## 2021-06-12 VITALS — BP 206/81 | HR 78 | Ht 63.0 in | Wt 152.0 lb

## 2021-06-12 DIAGNOSIS — R413 Other amnesia: Secondary | ICD-10-CM | POA: Diagnosis not present

## 2021-06-12 NOTE — Patient Instructions (Addendum)
Blood work: Vitamin B12 and thyroid levels MRI brain Follow up after testing  Tasks to improve attention/working memory 1. Good sleep hygiene (7-8 hrs of sleep) 2. Learning a new skill (Painting, Carpentry, Pottery, new language, Knitting). 3.Cognitive exercises (keep a daily journal, Puzzles) 4. Physical exercise and training  (30 min/day X 4 days week) 5. Being on Antidepressant if needed 6.Yoga, Meditation, Tai Chi 7. Decrease alcohol intake 8.Have a clear schedule and structure in daily routine

## 2021-06-12 NOTE — Telephone Encounter (Signed)
Medicaid order sent to [facility], [facility] they will reach out to the patient to schedule.

## 2021-06-13 ENCOUNTER — Telehealth: Payer: Self-pay

## 2021-06-13 LAB — TSH: TSH: 2.24 u[IU]/mL (ref 0.450–4.500)

## 2021-06-13 LAB — VITAMIN B12: Vitamin B-12: 627 pg/mL (ref 232–1245)

## 2021-06-13 NOTE — Telephone Encounter (Signed)
-----   Message from Genia Harold, MD sent at 06/13/2021  8:22 AM EDT ----- Blood work is normal

## 2021-06-13 NOTE — Telephone Encounter (Signed)
Contacted pt daughter, per DPR who speaks english. Informed her that her moms labs came back normal. Advised to call the office with questions as she had none at the time and was appreciative.

## 2021-06-17 ENCOUNTER — Inpatient Hospital Stay: Payer: Medicaid Other

## 2021-06-19 ENCOUNTER — Encounter: Payer: Self-pay | Admitting: Family

## 2021-06-19 ENCOUNTER — Other Ambulatory Visit: Payer: Self-pay

## 2021-06-19 ENCOUNTER — Ambulatory Visit (INDEPENDENT_AMBULATORY_CARE_PROVIDER_SITE_OTHER): Payer: Medicaid Other | Admitting: Family

## 2021-06-19 ENCOUNTER — Other Ambulatory Visit: Payer: Self-pay | Admitting: Pharmacist

## 2021-06-19 VITALS — BP 173/95 | HR 73 | Resp 16

## 2021-06-19 DIAGNOSIS — Z0001 Encounter for general adult medical examination with abnormal findings: Secondary | ICD-10-CM

## 2021-06-19 DIAGNOSIS — Z0000 Encounter for general adult medical examination without abnormal findings: Secondary | ICD-10-CM

## 2021-06-19 DIAGNOSIS — I10 Essential (primary) hypertension: Secondary | ICD-10-CM

## 2021-06-19 DIAGNOSIS — Z1211 Encounter for screening for malignant neoplasm of colon: Secondary | ICD-10-CM

## 2021-06-19 DIAGNOSIS — Z1159 Encounter for screening for other viral diseases: Secondary | ICD-10-CM

## 2021-06-19 DIAGNOSIS — Z23 Encounter for immunization: Secondary | ICD-10-CM | POA: Diagnosis not present

## 2021-06-19 DIAGNOSIS — Z13220 Encounter for screening for lipoid disorders: Secondary | ICD-10-CM

## 2021-06-19 DIAGNOSIS — Z1231 Encounter for screening mammogram for malignant neoplasm of breast: Secondary | ICD-10-CM

## 2021-06-19 DIAGNOSIS — Z13820 Encounter for screening for osteoporosis: Secondary | ICD-10-CM

## 2021-06-19 DIAGNOSIS — E119 Type 2 diabetes mellitus without complications: Secondary | ICD-10-CM

## 2021-06-19 DIAGNOSIS — Z0100 Encounter for examination of eyes and vision without abnormal findings: Secondary | ICD-10-CM

## 2021-06-19 MED ORDER — CLONIDINE HCL 0.1 MG PO TABS
0.1000 mg | ORAL_TABLET | Freq: Once | ORAL | Status: AC
  Administered 2021-06-19: 0.1 mg via ORAL

## 2021-06-19 MED ORDER — LOSARTAN POTASSIUM 100 MG PO TABS: 100.0000 mg | ORAL_TABLET | Freq: Every day | ORAL | 0 refills | Status: DC

## 2021-06-19 MED ORDER — METOPROLOL TARTRATE 100 MG PO TABS: 100.0000 mg | ORAL_TABLET | Freq: Two times a day (BID) | ORAL | 0 refills | Status: DC

## 2021-06-19 MED ORDER — ZOSTER VAC RECOMB ADJUVANTED 50 MCG/0.5ML IM SUSR: 0.5000 mL | Freq: Once | INTRAMUSCULAR | 0 refills | Status: AC

## 2021-06-19 MED ORDER — AMLODIPINE BESYLATE 10 MG PO TABS: 10.0000 mg | ORAL_TABLET | Freq: Every day | ORAL | 0 refills | Status: DC

## 2021-06-19 NOTE — Progress Notes (Signed)
Subjective:   Jennifer Molina is a 65 y.o. female who presents for an Initial Medicare Annual Wellness Visit.  Review of Systems     HYPERTENSION FOLLOW-UP: 05/06/2021: - Patient reports she did not take blood pressure medications this morning because she was trying to rush to this appointment.  - Continue Amlodipine, Losartan, and Metoprolol as prescribed.   06/19/2021: Took blood pressure medication at 9 am. Taking blood pressure medication daily. Not checking at home. Denies red flag symptoms.   Objective:    Today's Vitals   06/19/21 1026 06/19/21 1048 06/19/21 1117  BP: (!) 197/103 (!) 180/90 (!) 173/95  Pulse: 73    Resp: 16    SpO2: 95%    PainSc: 0-No pain     There is no height or weight on file to calculate BMI.  Diabetic foot exam was performed with the following findings:   No deformities, ulcerations, or other skin breakdown Normal sensation of 10g monofilament Intact posterior tibialis and dorsalis pedis pulses    Physical Exam HENT:     Head: Normocephalic and atraumatic.  Eyes:     Extraocular Movements: Extraocular movements intact.     Conjunctiva/sclera: Conjunctivae normal.     Pupils: Pupils are equal, round, and reactive to light.  Cardiovascular:     Rate and Rhythm: Normal rate and regular rhythm.     Pulses: Normal pulses.     Heart sounds: Normal heart sounds.  Pulmonary:     Effort: Pulmonary effort is normal.     Breath sounds: Normal breath sounds.  Musculoskeletal:     Cervical back: Normal range of motion and neck supple.  Neurological:     General: No focal deficit present.     Mental Status: She is alert and oriented to person, place, and time.  Psychiatric:        Mood and Affect: Mood normal.        Behavior: Behavior normal.    Advanced Directives 09/24/2020 08/27/2020 05/30/2020 07/21/2019  Does Patient Have a Medical Advance Directive? No No No No  Would patient like information on creating a medical advance directive? No -  Patient declined No - Patient declined No - Patient declined Yes (ED - Information included in AVS)   Current Medications (verified) Outpatient Encounter Medications as of 06/19/2021  Medication Sig   acetaminophen (TYLENOL) 500 MG tablet Take 1,000 mg by mouth every 6 (six) hours as needed for mild pain or fever.   ASPIRIN LOW DOSE 81 MG EC tablet Take 1 tablet (81 mg total) by mouth daily.   FEROSUL 325 (65 Fe) MG tablet Take 1 tablet (325 mg total) by mouth daily.   gabapentin (NEURONTIN) 300 MG capsule Take 1 capsule (300 mg total) by mouth at bedtime.   glimepiride (AMARYL) 1 MG tablet Take 1 tablet (1 mg total) by mouth daily with breakfast.   metFORMIN (GLUCOPHAGE) 1000 MG tablet Take 1 tablet (1,000 mg total) by mouth 2 (two) times daily with a meal.   [DISCONTINUED] amLODipine (NORVASC) 5 MG tablet Take 1 tablet (5 mg total) by mouth daily.   [DISCONTINUED] losartan (COZAAR) 50 MG tablet Take 1 tablet (50 mg total) by mouth daily.   [DISCONTINUED] metoprolol tartrate (LOPRESSOR) 100 MG tablet Take 1 tablet (100 mg total) by mouth 2 (two) times daily.   [DISCONTINUED] rosuvastatin (CRESTOR) 20 MG tablet Take 1 tablet (20 mg total) by mouth daily.   amLODipine (NORVASC) 10 MG tablet Take 1 tablet (10 mg total)  by mouth daily.   losartan (COZAAR) 100 MG tablet Take 1 tablet (100 mg total) by mouth daily.   metoprolol tartrate (LOPRESSOR) 100 MG tablet Take 1 tablet (100 mg total) by mouth 2 (two) times daily.   [EXPIRED] cloNIDine (CATAPRES) tablet 0.1 mg    No facility-administered encounter medications on file as of 06/19/2021.    Allergies (verified) Patient has no known allergies.   History: Past Medical History:  Diagnosis Date   Dementia ([place]) 03/28/2019   DM (diabetes mellitus), type 2, uncontrolled 2015   Has poor vision.  Family not aware of A1C or what sugars run.  Believe not well controlled.  Not able to monitor due to cost of monitoring equipment   Hypertension 2015    No past surgical history on file. Family History  Problem Relation Age of Onset   Diabetes Mellitus II Neg Hx    Social History   Socioeconomic History   Marital status: Married    Spouse name: [name]   Number of children: 7   Years of education: 3   Highest education level: 3rd grade  Occupational History   Occupation: Retired from farming in [place]  Tobacco Use   Smoking status: Never   Smokeless tobacco: Never  Vaping Use   Vaping Use: Never used  Substance and Sexual Activity   Alcohol use: Never   Drug use: Never   Sexual activity: Not on file  Other Topics Concern   Not on file  Social History Narrative   [place] to [place] in [place]   Lives at home with husband   Her 2 daughters, 2 sons and their families   All together, 26 people in the home   Social Determinants of Health   Financial Resource Strain: Not on file  Food Insecurity: Not on file  Transportation Needs: Not on file  Physical Activity: Not on file  Stress: Not on file  Social Connections: Not on file   Tobacco Counseling: Never smoked.   Clinical Intake: Pain : No/denies pain Pain Score: 0-No pain  Diabetes: No CBG done?: No Did pt. bring in CBG monitor from home?: No  Diabetic? Yes  Activities of Daily Living In your present state of health, do you have any difficulty performing the following activities: 05/06/2021  Hearing? N  Vision? N  Difficulty concentrating or making decisions? N  Walking or climbing stairs? N  Dressing or bathing? N  Doing errands, shopping? N  Some recent data might be hidden   Patient Care Team: Durene Fruits, NP as PCP - (Family Medicine) Santiago Bumpers, MD as Consulting Physician (Nephrology)  Sullivan Lone, MD as Consulting Physician (Hematology) Genia Harold, MD as Consulting Physician (Neurology)   Indicate any recent Medical Services you may have received from other than [facility] providers in the past year (date may be approximate). None   Assessment:   This is a routine wellness examination for [place].  Hearing/Vision screen Vision Screening   Right eye Left eye Both eyes  Without correction 20/70    With correction 20/70 20/100 20/70    Dietary issues and exercise activities discussed: yes   Goals Addressed: Improve blood pressure   Depression Screen PHQ 2/9 Scores 06/19/2021 05/06/2021  PHQ - 2 Score 1 0  PHQ- 9 Score 4 -   Fall Risk Fall Risk  06/19/2021  Falls in the past year? 0  Number falls in past yr: 0  Injury with Fall? 0  Risk for fall due to : No  Fall Risks  Follow up Falls evaluation completed   FALL RISK PREVENTION PERTAINING TO THE HOME: Any stairs in or around the home? No  If so, are there any without handrails?  Not applicable Home free of loose throw rugs in walkways, pet beds, electrical cords, etc? Yes  Adequate lighting in your home to reduce risk of falls? Yes   ASSISTIVE DEVICES UTILIZED TO PREVENT FALLS: Life alert? No  Use of a cane, walker or w/c? No  Grab bars in the bathroom? No  Shower chair or bench in shower? No  Elevated toilet seat or a handicapped toilet? No   TIMED UP AND GO: Was the test performed? Yes .  Length of time to ambulate 10 feet: 20 sec.  Gait slow and steady without use of assistive device  Cognitive Function: MMSE - Mini Mental State Exam 06/19/2021 06/12/2021  Orientation to time 3 3  Orientation to Place 3 2  Registration 0 3  Attention/ Calculation 0 0  Recall 0 1  Language- name 2 objects 2 2  Language- repeat 1 0  Language- follow 3 step command 2 3  Language- read & follow direction 0 1  Write a sentence 1 1  Copy design 1 1  Total score 13 17   Montreal Cognitive Assessment  03/28/2019  Visuospatial/ Executive (0/5) 1  Naming (0/3) 1  Attention: Read list of digits (0/2) 0  Attention: Read list of letters (0/1) 1  Attention: Serial 7 subtraction starting at 100 (0/3) 0  Language: Repeat phrase (0/2) 1  Language : Fluency (0/1) 0   Abstraction (0/2) 0  Delayed Recall (0/5) 0  Orientation (0/6) 4  Total 8  Adjusted Score (based on education) 9    Immunizations Immunization History  Administered Date(s) Administered   Influenza,inj,Quad PF,6+ Mos 06/21/2018, 06/19/2021    TDAP status: Due, Education has been provided regarding the importance of this vaccine. Advised may receive this vaccine at local pharmacy or Health Dept. Aware to provide a copy of the vaccination record if obtained from local pharmacy or Health Dept. Verbalized acceptance and understanding.  Flu Vaccine status: Completed at today's visit  Pneumococcal vaccine status: Declined,  Education has been provided regarding the importance of this vaccine but patient still declined. Advised may receive this vaccine at local pharmacy or Health Dept. Aware to provide a copy of the vaccination record if obtained from local pharmacy or Health Dept. Verbalized acceptance and understanding.   Covid-19 vaccine status: Completed vaccines Pfizer vaccines.  Qualifies for Shingles Vaccine? Yes   Zostavax completed No   Shingrix Completed?: Yes dose 1 completed today in office.  Screening Tests Health Maintenance  Topic Date Due   COVID-19 Vaccine (1) Never done   Pneumonia Vaccine 79+ Years old (1 - PCV) Never done   FOOT EXAM  Never done   OPHTHALMOLOGY EXAM  Never done   TETANUS/TDAP  Never done   Zoster Vaccines- Shingrix (1 of 2) Never done   COLONOSCOPY (Pts 45-44yrs Insurance coverage will need to be confirmed)  Never done   MAMMOGRAM  Never done   DEXA SCAN  Never done   HEMOGLOBIN A1C  11/03/2021   INFLUENZA VACCINE  Completed   Hepatitis C Screening  Completed   HPV VACCINES  Aged Out    Health Maintenance  Health Maintenance Due  Topic Date Due   COVID-19 Vaccine (1) Never done   Pneumonia Vaccine 75+ Years old (1 - PCV) Never done   FOOT EXAM  Never done   OPHTHALMOLOGY EXAM  Never done   TETANUS/TDAP  Never done   Zoster Vaccines-  Shingrix (1 of 2) Never done   COLONOSCOPY (Pts 45-18yrs Insurance coverage will need to be confirmed)  Never done   MAMMOGRAM  Never done   DEXA SCAN  Never done    Colorectal cancer screening: Referral to GI placed 06/19/2021. Pt aware the office will call re: appt.  Mammogram status: Ordered 06/19/2021. Pt provided with contact info and advised to call to schedule appt.   Bone Density status: Ordered 06/19/2021. Pt provided with contact info and advised to call to schedule appt.  Lung Cancer Screening: (Low Dose CT Chest recommended if Age 63-80 years, 30 pack-year currently smoking OR have quit w/in 15years.) does not qualify.   Lung Cancer Screening Referral: not applicable  Additional Screening:  Hepatitis C Screening: does qualify; Completed 06/19/2021  Vision Screening: Recommended annual ophthalmology exams for early detection of glaucoma and other disorders of the eye. Is the patient up to date with their annual eye exam?  No  Who is the provider or what is the name of the office in which the patient attends annual eye exams? Need referral If pt is not established with a provider, would they like to be referred to a provider to establish care? Yes .   Dental Screening: Recommended annual dental exams for proper oral hygiene   Plan:  1. Medicare annual wellness visit, initial: - Counseled on 150 minutes of exercise per week as tolerated, healthy eating (including decreased daily intake of saturated fats, cholesterol, added sugars, sodium), STI prevention, and routine healthcare maintenance.  2. Essential hypertension: - Blood pressure not at goal during today's visit. Patient asymptomatic without chest pressure, chest pain, palpitations, shortness of breath, worst headache of life, and any additional red flag symptoms. - Increase Losartan from 50 mg daily to 100 mg daily.  - Continue Amlodipine and Metoprolol Tartrate as prescribed. - Clonidine administered today in  office. - Counseled on blood pressure goal of less than 140/90, low-sodium, DASH diet, medication compliance, 150 minutes of moderate intensity exercise per week as tolerated. Discussed medication compliance, adverse effects. - Referral to Cardiology for further evaluation and management. - Follow-up with primary provider in 1 week for blood pressure check. - losartan (COZAAR) 100 MG tablet; Take 1 tablet (100 mg total) by mouth daily.  Dispense: 60 tablet; Refill: 0 - amLODipine (NORVASC) 10 MG tablet; Take 1 tablet (10 mg total) by mouth daily.  Dispense: 60 tablet; Refill: 0 - metoprolol tartrate (LOPRESSOR) 100 MG tablet; Take 1 tablet (100 mg total) by mouth 2 (two) times daily.  Dispense: 120 tablet; Refill: 0 - Ambulatory referral to Cardiology - cloNIDine (CATAPRES) tablet 0.1 mg  3. Screening cholesterol level: - Lipid panel to screen for high cholesterol.  - Lipid Panel  4. Osteoporosis screening: - Screening for osteoporosis.  - DG Bone Density; Future  5. Encounter for screening mammogram for malignant neoplasm of breast: - Referral for breast cancer screening by mammogram.  - MM Digital Screening; Future  6. Diabetic eye exam [facility]): - Referral to Ophthalmology for further evaluation and management.  - Ambulatory referral to Ophthalmology  7. Encounter for diabetic foot exam [facility]): - Completed today in office.   8. Colon cancer screening: - Referral to Gastroenterology for colon cancer screening by colonoscopy. - Ambulatory referral to Gastroenterology  9. Need for hepatitis C screening test: - Hepatitis C antibody to screen for hepatitis C.  -  Hepatitis C Antibody  10. Influenza vaccine needed: - Administered today in office.  - Flu Vaccine QUAD 6+ mos PF IM (Fluarix Quad PF)  I have personally reviewed and noted the following in the patient's chart:   Medical and social history Use of alcohol, tobacco or illicit drugs  Current medications and supplements  including opioid prescriptions. Patient is not currently taking opioid prescriptions. Functional ability and status Nutritional status Physical activity Advanced directives List of other physicians Hospitalizations, surgeries, and ER visits in previous 12 months Vitals Screenings to include cognitive, depression, and falls Referrals and appointments  In addition, I have reviewed and discussed with patient certain preventive protocols, quality metrics, and best practice recommendations. A written personalized care plan for preventive services as well as general preventive health recommendations were provided to patient.   [name], NP   06/21/2021

## 2021-06-19 NOTE — Progress Notes (Signed)
Refill medication

## 2021-06-21 ENCOUNTER — Other Ambulatory Visit: Payer: Self-pay | Admitting: Family

## 2021-06-21 DIAGNOSIS — E785 Hyperlipidemia, unspecified: Secondary | ICD-10-CM

## 2021-06-21 LAB — LIPID PANEL
Chol/HDL Ratio: 3.8 ratio (ref 0.0–4.4)
Cholesterol, Total: 157 mg/dL (ref 100–199)
HDL: 41 mg/dL (ref >39)
LDL Chol Calc (NIH): 80 mg/dL (ref 0–99)
Triglycerides: 218 mg/dL — ABNORMAL HIGH (ref 0–149)
VLDL Cholesterol Cal: 36 mg/dL (ref 5–40)

## 2021-06-21 LAB — HEPATITIS C ANTIBODY: Hep C Virus Ab: <0.1 s/co ratio (ref 0.0–0.9)

## 2021-06-21 MED ORDER — ATORVASTATIN CALCIUM 40 MG PO TABS: 40.0000 mg | ORAL_TABLET | Freq: Every day | ORAL | 1 refills | Status: DC

## 2021-06-21 NOTE — Progress Notes (Signed)
Hepatitis C negative.   Cholesterol higher than expected. High cholesterol may increase risk of heart attack and/or stroke. Consider eating more fruits, vegetables, and lean baked meats such as chicken or fish. Moderate intensity exercise at least 150 minutes as tolerated per week may help as well.   Discontinue Rosuvastatin for high cholesterol. Begin Atorvastatin for high cholesterol. Encouraged to recheck cholesterol at lab only appointment in 6 to 8 weeks.

## 2021-06-23 NOTE — Progress Notes (Signed)
Patient ID: Jennifer Molina, female    DOB: March 14, 1956  MRN: [id number]  CC: Hypertension Follow-Up   Subjective: Jennifer Molina is a 65 y.o. female who presents for hypertension follow-up. She is accompanied by her daughter Jennifer Molina.  Her concerns today include:   HYPERTENSION FOLLOW-UP: 06/19/2021: - Increase Losartan from 50 mg daily to 100 mg daily.  - Continue Amlodipine and Metoprolol Tartrate as prescribed. - Clonidine administered today in office.  06/26/2021: Reports since last appointment did not pickup Losartan 100 mg tablets from pharmacy. Still taking Losartan 50 mg daily and Amlodipine 10 mg daily. When asked why patient reports she doesn't know. Reports not taking Metoprolol because it makes her feel tired. Patient's daughter reports she was unaware of the call from Cardiology on 06/20/2021 to schedule an appointment per referral from 06/19/2021. Reports she was in class and could not answer phone. Patient declines Clonidine on today in office stating the last time she took it made her feel dizzy with nausea and vomiting.    Patient Active Problem List   Diagnosis Date Noted   Hyperlipidemia 06/21/2021   Acute respiratory failure with hypoxia ([place]) 07/23/2019   Sepsis due to COVID-19 [facility]) 07/22/2019   Pneumonia due to COVID-19 virus 07/22/2019   Dementia ([place]) 03/28/2019   Chronic post-traumatic stress disorder (PTSD) 03/28/2019   DM (diabetes mellitus), type 2, uncontrolled 2015   Hypertension 2015     Current Outpatient Medications on File Prior to Visit  Medication Sig Dispense Refill   acetaminophen (TYLENOL) 500 MG tablet Take 1,000 mg by mouth every 6 (six) hours as needed for mild pain or fever.     amLODipine (NORVASC) 10 MG tablet Take 1 tablet (10 mg total) by mouth daily. 60 tablet 0   ASPIRIN LOW DOSE 81 MG EC tablet Take 1 tablet (81 mg total) by mouth daily. 30 tablet 2   atorvastatin (LIPITOR) 40 MG tablet Take 1 tablet (40 mg total) by mouth daily. 90  tablet 1   FEROSUL 325 (65 Fe) MG tablet Take 1 tablet (325 mg total) by mouth daily. 30 tablet 5   gabapentin (NEURONTIN) 300 MG capsule Take 1 capsule (300 mg total) by mouth at bedtime. 30 capsule 2   glimepiride (AMARYL) 1 MG tablet Take 1 tablet (1 mg total) by mouth daily with breakfast. 30 tablet 2   losartan (COZAAR) 100 MG tablet Take 1 tablet (100 mg total) by mouth daily. 60 tablet 0   metFORMIN (GLUCOPHAGE) 1000 MG tablet Take 1 tablet (1,000 mg total) by mouth 2 (two) times daily with a meal. 60 tablet 2   metoprolol tartrate (LOPRESSOR) 100 MG tablet Take 1 tablet (100 mg total) by mouth 2 (two) times daily. (Patient not taking: Reported on 06/26/2021) 120 tablet 0   No current facility-administered medications on file prior to visit.    No Known Allergies  Social History   Socioeconomic History   Marital status: Married    Spouse name: Jennifer Molina   Number of children: 7   Years of education: 3   Highest education level: 3rd grade  Occupational History   Occupation: Retired from farming in [place]  Tobacco Use   Smoking status: Never   Smokeless tobacco: Never  Vaping Use   Vaping Use: Never used  Substance and Sexual Activity   Alcohol use: Never   Drug use: Never   Sexual activity: Not on file  Other Topics Concern   Not on file  Social History  Narrative   [place] in [place]   Lives at home with husband   Her 2 daughters, 2 sons and their families   All together, 73 people in the home   Social Determinants of Health   Financial Resource Strain: Not on file  Food Insecurity: Not on file  Transportation Needs: Not on file  Physical Activity: Not on file  Stress: Not on file  Social Connections: Not on file  Intimate Partner Violence: Not on file    Family History  Problem Relation Age of Onset   Diabetes Mellitus II Neg Hx     No past surgical history on file.  ROS: Review of Systems Negative except as stated above  PHYSICAL EXAM: BP (!)  181/100   Pulse 76   Temp 98.9 F (37.2 C)   Resp 18   Ht 5' 0.98" (1.549 m)   Wt 149 lb 6.4 oz (67.8 kg)   LMP  (LMP Unknown)   SpO2 97%   BMI 28.24 kg/m   Physical Exam HENT:     Head: Normocephalic and atraumatic.  Eyes:     Extraocular Movements: Extraocular movements intact.     Conjunctiva/sclera: Conjunctivae normal.     Pupils: Pupils are equal, round, and reactive to light.  Cardiovascular:     Rate and Rhythm: Normal rate and regular rhythm.     Pulses: Normal pulses.     Heart sounds: Normal heart sounds.  Pulmonary:     Effort: Pulmonary effort is normal.     Breath sounds: Normal breath sounds.  Musculoskeletal:     Cervical back: Normal range of motion and neck supple.  Neurological:     General: No focal deficit present.     Mental Status: She is alert and oriented to person, place, and time.  Psychiatric:        Mood and Affect: Mood normal.        Behavior: Behavior normal.   ASSESSMENT AND PLAN: 1. Essential hypertension: - Blood pressure not at goal during today's visit. Patient asymptomatic without chest pressure, chest pain, palpitations, shortness of breath, worst headache of life, and any additional red flag symptoms. - Patient declined Clonidine today in office. - Counseled patient on the importance of taking all antihypertensives as prescribed.  - Counseled to begin Losartan 100 mg daily as prescribed.  - Continue Amlodipine as prescribed.  - Resume Metoprolol as prescribed.  - Emphasized the risks of uncontrolled hypertension such as but not limited to heart attack and/or stroke. Patient and daughter verbalized understanding. - Printed for patient discharge instructions which included contact information for Cardiology referral. Encouraged to call today to schedule appointment. Daughter agreeable.  - Counseled on blood pressure goal of less than 140/90, low-sodium, DASH diet, medication compliance, 150 minutes of moderate intensity exercise per  week as tolerated. Discussed medication compliance, adverse effects. - Follow-up with primary provider as scheduled.  2. Language barrier: - Patient accompanied by her daughter Jennifer Molina who serves as part-historian and interpreter.     Patient was given the opportunity to ask questions.  Patient verbalized understanding of the plan and was able to repeat key elements of the plan. Patient was given clear instructions to go to Emergency Department or return to medical center if symptoms don't improve, worsen, or new problems develop.The patient verbalized understanding.  Follow-up with primary provider as scheduled.   [name], NP

## 2021-06-24 ENCOUNTER — Ambulatory Visit: Payer: Medicaid Other | Admitting: Hematology

## 2021-06-26 ENCOUNTER — Encounter: Payer: Self-pay | Admitting: Family

## 2021-06-26 ENCOUNTER — Other Ambulatory Visit: Payer: Self-pay

## 2021-06-26 ENCOUNTER — Ambulatory Visit (INDEPENDENT_AMBULATORY_CARE_PROVIDER_SITE_OTHER): Payer: Medicaid Other | Admitting: Family

## 2021-06-26 VITALS — BP 181/100 | HR 76 | Temp 98.9°F | Resp 18 | Ht 60.98 in | Wt 149.4 lb

## 2021-06-26 DIAGNOSIS — Z789 Other specified health status: Secondary | ICD-10-CM | POA: Diagnosis not present

## 2021-06-26 DIAGNOSIS — I10 Essential (primary) hypertension: Secondary | ICD-10-CM

## 2021-06-26 NOTE — Patient Instructions (Addendum)
Please call heart doctor located at [address] [place], [place] [address], Phone # [phone]. DASH Eating Plan DASH stands for Dietary Approaches to Stop Hypertension. The DASH eating plan is a healthy eating plan that has been shown to: Reduce high blood pressure (hypertension). Reduce your risk for type 2 diabetes, heart disease, and stroke. Help with weight loss. What are tips for following this plan? Reading food labels Check food labels for the amount of salt (sodium) per serving. Choose foods with less than 5 percent of the Daily Value of sodium. Generally, foods with less than 300 milligrams (mg) of sodium per serving fit into this eating plan. To find whole grains, look for the word "whole" as the first word in the ingredient list. Shopping Buy products labeled as "low-sodium" or "no salt added." Buy fresh foods. Avoid canned foods and pre-made or frozen meals. Cooking Avoid adding salt when cooking. Use salt-free seasonings or herbs instead of table salt or sea salt. Check with your health care provider or pharmacist before using salt substitutes. Do not fry foods. Cook foods using healthy methods such as baking, boiling, grilling, roasting, and broiling instead. Cook with heart-healthy oils, such as olive, canola, avocado, soybean, or sunflower oil. Meal planning  Eat a balanced diet that includes: 4 or more servings of fruits and 4 or more servings of vegetables each day. Try to fill one-half of your plate with fruits and vegetables. 6-8 servings of whole grains each day. Less than 6 oz (170 g) of lean meat, poultry, or fish each day. A 3-oz (85-g) serving of meat is about the same size as a deck of cards. One egg equals 1 oz (28 g). 2-3 servings of low-fat dairy each day. One serving is 1 cup (237 mL). 1 serving of nuts, seeds, or beans 5 times each week. 2-3 servings of heart-healthy fats. Healthy fats called omega-3 fatty acids are  found in foods such as walnuts, flaxseeds, fortified milks, and eggs. These fats are also found in cold-water fish, such as sardines, salmon, and mackerel. Limit how much you eat of: Canned or prepackaged foods. Food that is high in trans fat, such as some fried foods. Food that is high in saturated fat, such as fatty meat. Desserts and other sweets, sugary drinks, and other foods with added sugar. Full-fat dairy products. Do not salt foods before eating. Do not eat more than 4 egg yolks a week. Try to eat at least 2 vegetarian meals a week. Eat more home-cooked food and less restaurant, buffet, and fast food. Lifestyle When eating at a restaurant, ask that your food be prepared with less salt or no salt, if possible. If you drink alcohol: Limit how much you use to: 0-1 drink a day for women who are not pregnant. 0-2 drinks a day for men. Be aware of how much alcohol is in your drink. In the [place] one drink equals one 12 oz bottle of beer (355 mL), one 5 oz glass of wine (148 mL), or one 1 oz glass of hard liquor (44 mL). General information Avoid eating more than 2,300 mg of salt a day. If you have hypertension, you may need to reduce your sodium intake to 1,500 mg a day. Work with your health care provider to maintain a healthy body weight or to lose weight. Ask what an ideal weight is for you. Get at least 30 minutes of exercise that causes your heart to beat faster (aerobic  exercise) most days of the week. Activities may include walking, swimming, or biking. Work with your health care provider or dietitian to adjust your eating plan to your individual calorie needs. What foods should I eat? Fruits All fresh, dried, or frozen fruit. Canned fruit in natural juice (without added sugar). Vegetables Fresh or frozen vegetables (raw, steamed, roasted, or grilled). Low-sodium or reduced-sodium tomato and vegetable juice. Low-sodium or reduced-sodium tomato sauce and tomato paste. Low-sodium  or reduced-sodium canned vegetables. Grains Whole-grain or whole-wheat bread. Whole-grain or whole-wheat pasta. Brown rice. [place]. Bulgur. Whole-grain and low-sodium cereals. Pita bread. Low-fat, low-sodium crackers. Whole-wheat flour tortillas. Meats and other proteins Skinless chicken or [place]. Ground chicken or [place]. Pork with fat trimmed off. Fish and seafood. Egg whites. Dried beans, peas, or lentils. Unsalted nuts, nut butters, and seeds. Unsalted canned beans. Lean cuts of beef with fat trimmed off. Low-sodium, lean precooked or cured meat, such as sausages or meat loaves. Dairy Low-fat (1%) or fat-free (skim) milk. Reduced-fat, low-fat, or fat-free cheeses. Nonfat, low-sodium ricotta or cottage cheese. Low-fat or nonfat yogurt. Low-fat, low-sodium cheese. Fats and oils Soft margarine without trans fats. Vegetable oil. Reduced-fat, low-fat, or light mayonnaise and salad dressings (reduced-sodium). Canola, safflower, olive, avocado, soybean, and sunflower oils. Avocado. Seasonings and condiments Herbs. Spices. Seasoning mixes without salt. Other foods Unsalted popcorn and pretzels. Fat-free sweets. The items listed above may not be a complete list of foods and beverages you can eat. Contact a dietitian for more information. What foods should I avoid? Fruits Canned fruit in a light or heavy syrup. Fried fruit. Fruit in cream or butter sauce. Vegetables Creamed or fried vegetables. Vegetables in a cheese sauce. Regular canned vegetables (not low-sodium or reduced-sodium). Regular canned tomato sauce and paste (not low-sodium or reduced-sodium). Regular tomato and vegetable juice (not low-sodium or reduced-sodium). Angie Fava. Olives. Grains Baked goods made with fat, such as croissants, muffins, or some breads. Dry pasta or rice meal packs. Meats and other proteins Fatty cuts of meat. Ribs. Fried meat. Berniece Salines. Bologna, salami, and other precooked or cured meats, such as sausages or  meat loaves. Fat from the back of a pig (fatback). Bratwurst. Salted nuts and seeds. Canned beans with added salt. Canned or smoked fish. Whole eggs or egg yolks. Chicken or [place] with skin. Dairy Whole or 2% milk, cream, and half-and-half. Whole or full-fat cream cheese. Whole-fat or sweetened yogurt. Full-fat cheese. Nondairy creamers. Whipped toppings. Processed cheese and cheese spreads. Fats and oils Butter. Stick margarine. Lard. Shortening. Ghee. Bacon fat. Tropical oils, such as coconut, palm kernel, or palm oil. Seasonings and condiments Onion salt, garlic salt, seasoned salt, table salt, and sea salt. Worcestershire sauce. Tartar sauce. Barbecue sauce. Teriyaki sauce. Soy sauce, including reduced-sodium. Steak sauce. Canned and packaged gravies. Fish sauce. Oyster sauce. Cocktail sauce. Store-bought horseradish. Ketchup. Mustard. Meat flavorings and tenderizers. Bouillon cubes. Hot sauces. Pre-made or packaged marinades. Pre-made or packaged taco seasonings. Relishes. Regular salad dressings. Other foods Salted popcorn and pretzels. The items listed above may not be a complete list of foods and beverages you should avoid. Contact a dietitian for more information. Where to find more information National Heart, Lung, and Blood Institute: https://wilson-eaton.com/ American Heart Association: www.heart.org Academy of Nutrition and Dietetics: www.eatright.[place]: www.kidney.org Summary The DASH eating plan is a healthy eating plan that has been shown to reduce high blood pressure (hypertension). It may also reduce your risk for type 2 diabetes, heart disease, and stroke. When on the DASH  eating plan, aim to eat more fresh fruits and vegetables, whole grains, lean proteins, low-fat dairy, and heart-healthy fats. With the DASH eating plan, you should limit salt (sodium) intake to 2,300 mg a day. If you have hypertension, you may need to reduce your sodium intake to 1,500 mg a  day. Work with your health care provider or dietitian to adjust your eating plan to your individual calorie needs. This information is not intended to replace advice given to you by your health care provider. Make sure you discuss any questions you have with your health care provider. Document Revised: 07/08/2019 Document Reviewed: 07/08/2019 Elsevier Patient Education  2022 [employer].

## 2021-06-26 NOTE — Progress Notes (Signed)
Pt presents for hypertension follow-up, accompanied by daughter Antony Madura daughter states that pt has not taken metoprolol because it makes her tired

## 2021-07-09 ENCOUNTER — Encounter: Payer: Self-pay | Admitting: Internal Medicine

## 2021-07-09 ENCOUNTER — Other Ambulatory Visit: Payer: Self-pay

## 2021-07-09 ENCOUNTER — Ambulatory Visit (INDEPENDENT_AMBULATORY_CARE_PROVIDER_SITE_OTHER): Payer: Medicaid Other | Admitting: Internal Medicine

## 2021-07-09 VITALS — BP 146/72 | HR 79 | Resp 20 | Ht 63.0 in | Wt 155.0 lb

## 2021-07-09 DIAGNOSIS — R9431 Abnormal electrocardiogram [ECG] [EKG]: Secondary | ICD-10-CM | POA: Diagnosis not present

## 2021-07-09 MED ORDER — SPIRONOLACTONE 25 MG PO TABS: 12.5000 mg | ORAL_TABLET | Freq: Every day | ORAL | 1 refills | Status: DC

## 2021-07-09 NOTE — Progress Notes (Signed)
Cardiology Office Note:    Date:  07/09/2021   ID:  Jennifer Molina, DOB [birth year], MRN [id number]  PCP:  [name], NP   [facility] Providers Cardiologist:  None     Referring MD: [name], NP   No chief complaint on file. Hypertension management  History of Present Illness:    Jennifer Molina is a 65 y.o. female with a hx of HTN, T2DM, HLD, referral for hypertension management, interview with an interpreter  She was seen on 06/22/2023 by her primary provider. Her BP was 197/103 with prior 180/90 and 173/95. She was asymptomatic. She was given clonidine. Her Bps have been in the 200s. Today, she comes in with an interpreter. She just started losartan 100 mg and amlodipine 10 mg daily.  She takes at home but not sure. She does not write down the blood pressure values. She denies heart disease in the past. She does not smoke. No cardiac stress test. Has not seen cardiology. 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 She is standing during the day. She denies chest pain or shortness of breath. She denies palpitations. She denies LH, dizziness, or syncope.  She an episode where she felt it was hard to breath, she was given her medicine and it resolved. This occurred in February. She  denies heart disease in parents. She has no siblings. Her son had a heart attack and he died in [place].   06/21/2021 Crt 1.3 K 4.5 LDL 80 TC 157 TSH 2.2 Hgb 9.8  Past Medical History:  Diagnosis Date   Dementia ([place]) 03/28/2019   DM (diabetes mellitus), type 2, uncontrolled 2015   Has poor vision.  Family not aware of A1C or what sugars run.  Believe not well controlled.  Not able to monitor due to cost of monitoring equipment   Hypertension 2015    No past surgical history on file.  Current Medications: No outpatient medications have been marked as taking for the 07/09/21 encounter (Appointment) with [name], MD.     Allergies:   Patient has no known  allergies.   Social History   Socioeconomic History   Marital status: Married    Spouse name: [name]   Number of children: 7   Years of education: 3   Highest education level: 3rd grade  Occupational History   Occupation: Retired from farming in [place]  Tobacco Use   Smoking status: Never   Smokeless tobacco: Never  Vaping Use   Vaping Use: Never used  Substance and Sexual Activity   Alcohol use: Never   Drug use: Never   Sexual activity: Not on file  Other Topics Concern   Not on file  Social History Narrative   [place] in [place]   Lives at home with husband   Her 2 daughters, 2 sons and their families   All together, 57 people in the home   Social Determinants of Health   Financial Resource Strain: Not on file  Food Insecurity: Not on file  Transportation Needs: Not on file  Physical Activity: Not on file  Stress: Not on file  Social Connections: Not on file     Family History: The patient's family history is negative for Diabetes Mellitus II.  ROS:   Please see the history of present illness.     All other systems reviewed and are negative.  EKGs/Labs/Other Studies Reviewed:    The following studies were  reviewed today:   EKG:  EKG is  ordered today.  The ekg ordered today demonstrates   NSR, septal Q waves  Recent Labs: 02/14/2021: ALT 8; BUN 21; Creatinine 1.32; Hemoglobin 9.8; Platelet Count 234; Potassium 4.5; Sodium 137 06/12/2021: TSH 2.240  Recent Lipid Panel    Component Value Date/Time   CHOL 157 06/19/2021 1125   TRIG 218 (H) 06/19/2021 1125   HDL 41 06/19/2021 1125   CHOLHDL 3.8 06/19/2021 1125   LDLCALC 80 06/19/2021 1125     Risk Assessment/Calculations:           Physical Exam:    VS:  LMP  (LMP Unknown)     Wt Readings from Last 3 Encounters:  06/26/21 149 lb 6.4 oz (67.8 kg)  06/12/21 152 lb (68.9 kg)  05/06/21 148 lb 6.4 oz (67.3 kg)     GEN:  Well nourished, well developed in no acute distress HEENT:  Normal NECK: No JVD; No carotid bruits LYMPHATICS: No lymphadenopathy CARDIAC: RRR, no murmurs, rubs, gallops RESPIRATORY:  Clear to auscultation without rales, wheezing or rhonchi  ABDOMEN: Soft, non-tender, non-distended MUSCULOSKELETAL:  No edema; No deformity  SKIN: Warm and dry NEUROLOGIC:  Alert and oriented x 3 PSYCHIATRIC:  Normal affect   ASSESSMENT:    #Resistant Hypertension: She is on 3  BP medications and blood pressure is not at goal. She hs no signs of pheo. Can consider renal duplex on her follow up visit, would like to get TTE first. Her EKG showed septal q waves and she has increased risk for CVD. For her hypertension, starting spironolactone 12.5 mg daily. Her Crt/K are ok, but will repeat to ensure spironolactone is still a safe option. I instructed her and her daughter to take her blood pressures at home and write them down. They are to bring in the log at her follow up visit in 3 months. - start spironolactone 12.5 mg daily - continue metoprolol 100 mg daily - continue norvasc 10 mg daily   CVD Risk Mitigation: continue atorvastatin 40 mg.   PLAN:    In order of problems listed above:  Echocardiogram Start spironolactone 12.5 mg daily BMP, Mg in 2 weeks Follow up in 3 months Instructed to document her blood pressures           Medication Adjustments/Labs and Tests Ordered: Current medicines are reviewed at length with the patient today.  Concerns regarding medicines are outlined above.   Signed, [name], MD  07/09/2021 7:14 AM    [place]

## 2021-07-09 NOTE — Patient Instructions (Addendum)
Medication Instructions:  Start Spironolactone 12.5 mg daily   *If you need a refill on your cardiac medications before your next appointment, please call your pharmacy*   Lab Work: BMET, MAG in 2 weeks. (Come back, no lab appointment needed)  If you have labs (blood work) drawn today and your tests are completely normal, you will receive your results only by: [place] (if you have MyChart) OR A paper copy in the mail If you have any lab test that is abnormal or we need to change your treatment, we will call you to review the results.   Testing/Procedures:  Echocardiogram - Your physician has requested that you have an echocardiogram. Echocardiography is a painless test that uses sound waves to create images of your heart. It provides your doctor with information about the size and shape of your heart and how well your heart's chambers and valves are working. This procedure takes approximately one hour. There are no restrictions for this procedure. This will be performed at either our [facility] location - [address] [place] location [facility] 2nd floor.    Follow-Up: At [facility], you and your health needs are our priority.  As part of our continuing mission to provide you with exceptional heart care, we have created designated Provider Care Teams.  These Care Teams include your primary Cardiologist (physician) and Advanced Practice Providers (APPs -  Physician Assistants and Nurse Practitioners) who all work together to provide you with the care you need, when you need it.  We recommend signing up for the patient portal called "MyChart".  Sign up information is provided on this After Visit Summary.  MyChart is used to connect with patients for Virtual Visits (Telemedicine).  Patients are able to view lab/test results, encounter notes, upcoming appointments, etc.  Non-urgent messages can be sent to your provider as well.   To learn more  about what you can do with MyChart, go to [email or web address].    Your next appointment:   3 month(s)  The format for your next appointment:   In Person  Provider:   [name], MD    Other Instructions Please take your blood pressure each morning for two weeks. Please write down the numbers for your next appointment

## 2021-07-19 NOTE — Progress Notes (Signed)
Patient ID: Jennifer Molina, female    DOB: 1955-08-29  MRN: [id number]  CC: Diabetes Follow-Up   Subjective: Jennifer Molina is a 65 y.o. female who presents for diabetes follow-up.   Her concerns today include:   DIABETES TYPE 2 FOLLOW-UP: 05/06/2021: - Continue Metformin and Glimepiride as prescribed.   07/23/2021: Taking medication as prescribed. No issues/concerns.  2. HYPERTENSION FOLLOW-UP: 06/26/2021: - Patient declined Clonidine today in office. - Counseled patient on the importance of taking all antihypertensives as prescribed.  - Counseled to begin Losartan 100 mg daily as prescribed.  - Continue Amlodipine as prescribed.  - Resume Metoprolol as prescribed.  - Emphasized the risks of uncontrolled hypertension such as but not limited to heart attack and/or stroke. Patient and daughter verbalized understanding. - Printed for patient discharge instructions which included contact information for Cardiology referral. Encouraged to call today to schedule appointment. Daughter agreeable.   07/23/2021: Recent appointment with Cardiology 07/09/2021.   Patient Active Problem List   Diagnosis Date Noted   Hyperlipidemia 06/21/2021   Acute respiratory failure with hypoxia ([place]) 07/23/2019   Sepsis due to COVID-19 [facility]) 07/22/2019   Pneumonia due to COVID-19 virus 07/22/2019   Dementia ([place]) 03/28/2019   Chronic post-traumatic stress disorder (PTSD) 03/28/2019   DM (diabetes mellitus), type 2, uncontrolled 2015   Hypertension 2015     Current Outpatient Medications on File Prior to Visit  Medication Sig Dispense Refill   acetaminophen (TYLENOL) 500 MG tablet Take 1,000 mg by mouth every 6 (six) hours as needed for mild pain or fever.     amLODipine (NORVASC) 10 MG tablet Take 1 tablet (10 mg total) by mouth daily. 60 tablet 0   ASPIRIN LOW DOSE 81 MG EC tablet Take 1 tablet (81 mg total) by mouth daily. 30 tablet 2   atorvastatin (LIPITOR) 40 MG tablet Take 1 tablet (40 mg total) by  mouth daily. 90 tablet 1   FEROSUL 325 (65 Fe) MG tablet Take 1 tablet (325 mg total) by mouth daily. 30 tablet 5   gabapentin (NEURONTIN) 300 MG capsule Take 1 capsule (300 mg total) by mouth at bedtime. 30 capsule 2   losartan (COZAAR) 100 MG tablet Take 1 tablet (100 mg total) by mouth daily. 60 tablet 0   metoprolol tartrate (LOPRESSOR) 100 MG tablet Take 1 tablet (100 mg total) by mouth 2 (two) times daily. 120 tablet 0   spironolactone (ALDACTONE) 25 MG tablet Take 0.5 tablets (12.5 mg total) by mouth daily. 60 tablet 1   No current facility-administered medications on file prior to visit.    No Known Allergies  Social History   Socioeconomic History   Marital status: Married    Spouse name: [name]   Number of children: 7   Years of education: 3   Highest education level: 3rd grade  Occupational History   Occupation: Retired from farming in [place]  Tobacco Use   Smoking status: Never   Smokeless tobacco: Never  Vaping Use   Vaping Use: Never used  Substance and Sexual Activity   Alcohol use: Never   Drug use: Never   Sexual activity: Not on file  Other Topics Concern   Not on file  Social History Narrative   [place] in [place]   Lives at home with husband   Her 2 daughters, 2 sons and their families   All together, 72 people in the home   Social Determinants of Health   Financial Resource Strain: Not  on file  Food Insecurity: Not on file  Transportation Needs: Not on file  Physical Activity: Not on file  Stress: Not on file  Social Connections: Not on file  Intimate Partner Violence: Not on file    Family History  Problem Relation Age of Onset   Diabetes Mellitus II Neg Hx     No past surgical history on file.  ROS: Review of Systems Negative except as stated above  PHYSICAL EXAM: BP (!) 158/71 (BP Location: Left Arm, Patient Position: Sitting, Cuff Size: Normal)   Pulse 77   Temp 98.6 F (37 C)   Resp 18   Ht 5' 0.98" (1.549 m)   Wt 152  lb (68.9 kg)   LMP  (LMP Unknown)   SpO2 96%   BMI 28.74 kg/m   Physical Exam HENT:     Head: Normocephalic and atraumatic.  Eyes:     Extraocular Movements: Extraocular movements intact.     Conjunctiva/sclera: Conjunctivae normal.     Pupils: Pupils are equal, round, and reactive to light.  Cardiovascular:     Rate and Rhythm: Normal rate and regular rhythm.     Pulses: Normal pulses.     Heart sounds: Normal heart sounds.  Pulmonary:     Effort: Pulmonary effort is normal.     Breath sounds: Normal breath sounds.  Musculoskeletal:     Cervical back: Normal range of motion and neck supple.  Neurological:     General: No focal deficit present.     Mental Status: She is alert and oriented to person, place, and time.  Psychiatric:        Mood and Affect: Mood normal.        Behavior: Behavior normal.   Results for orders placed or performed in visit on 07/23/21  POCT glycosylated hemoglobin (Hb A1C)  Result Value Ref Range   Hemoglobin A1C 9.1 (A) 4.0 - 5.6 %   HbA1c POC (<> result, manual entry)     HbA1c, POC (prediabetic range)     HbA1c, POC (controlled diabetic range)      ASSESSMENT AND PLAN: 1. Type 2 diabetes mellitus without complication, without long-term current use of insulin ([place]): - Hemoglobin A1c today not at goal at 9.1%, goal < 8%. This is increased from previous 8.2% on 05/06/2021. - Continue Metformin as prescribed.  - Increase Glimepiride from 1 mg daily to 1 mg twice daily.  - Begin checking blood sugars at home and bring results to next appointment.  - Discussed the importance of healthy eating habits, low-carbohydrate diet, low-sugar diet, regular aerobic exercise (at least 150 minutes a week as tolerated) and medication compliance to achieve or maintain control of diabetes. - Will update BMP today. - Follow-up with primary provider in 4 weeks or sooner if needed.  - POCT glycosylated hemoglobin (Hb R4W) - Basic Metabolic Panel - glimepiride  (AMARYL) 1 MG tablet; Take 1 tablet (1 mg total) by mouth 2 (two) times daily with a meal.  Dispense: 180 tablet; Refill: 0 - metFORMIN (GLUCOPHAGE) 1000 MG tablet; Take 1 tablet (1,000 mg total) by mouth 2 (two) times daily with a meal.  Dispense: 180 tablet; Refill: 0 - Blood Glucose Monitoring Suppl (TRUE METRIX METER) w/Device KIT; Use as directed  Dispense: 1 kit; Refill: 0 - TRUEplus Lancets 28G MISC; Use as directed  Dispense: 100 each; Refill: 4 - glucose blood (TRUE METRIX BLOOD GLUCOSE TEST) test strip; Use as instructed  Dispense: 100 each; Refill: 12  2.  Essential hypertension: - Keep all scheduled appointments with Cardiology.   3. Language barrier: - Patient accompanied by her daughter Alexandrina Fiorini who serves as part-historian and interpreter.    Patient was given the opportunity to ask questions.  Patient verbalized understanding of the plan and was able to repeat key elements of the plan. Patient was given clear instructions to go to Emergency Department or return to medical center if symptoms don't improve, worsen, or new problems develop.The patient verbalized understanding.   Orders Placed This Encounter  Procedures   Basic Metabolic Panel   POCT glycosylated hemoglobin (Hb A1C)     Requested Prescriptions   Signed Prescriptions Disp Refills   glimepiride (AMARYL) 1 MG tablet 180 tablet 0    Sig: Take 1 tablet (1 mg total) by mouth 2 (two) times daily with a meal.   metFORMIN (GLUCOPHAGE) 1000 MG tablet 180 tablet 0    Sig: Take 1 tablet (1,000 mg total) by mouth 2 (two) times daily with a meal.   Blood Glucose Monitoring Suppl (TRUE METRIX METER) w/Device KIT 1 kit 0    Sig: Use as directed   TRUEplus Lancets 28G MISC 100 each 4    Sig: Use as directed   glucose blood (TRUE METRIX BLOOD GLUCOSE TEST) test strip 100 each 12    Sig: Use as instructed    Return in about 4 weeks (around 08/20/2021) for Follow-Up or next available diabetes .  [name], NP

## 2021-07-23 ENCOUNTER — Other Ambulatory Visit: Payer: Self-pay

## 2021-07-23 ENCOUNTER — Encounter: Payer: Self-pay | Admitting: Family

## 2021-07-23 ENCOUNTER — Ambulatory Visit: Payer: Medicaid Other | Admitting: Family

## 2021-07-23 VITALS — BP 158/71 | HR 77 | Temp 98.6°F | Resp 18 | Ht 60.98 in | Wt 152.0 lb

## 2021-07-23 DIAGNOSIS — E119 Type 2 diabetes mellitus without complications: Secondary | ICD-10-CM | POA: Diagnosis not present

## 2021-07-23 DIAGNOSIS — I10 Essential (primary) hypertension: Secondary | ICD-10-CM

## 2021-07-23 DIAGNOSIS — Z789 Other specified health status: Secondary | ICD-10-CM | POA: Diagnosis not present

## 2021-07-23 LAB — POCT GLYCOSYLATED HEMOGLOBIN (HGB A1C): Hemoglobin A1C: 9.1 % — AB (ref 4.0–5.6)

## 2021-07-23 MED ORDER — METFORMIN HCL 1000 MG PO TABS: 1000.0000 mg | ORAL_TABLET | Freq: Two times a day (BID) | ORAL | 0 refills | Status: DC

## 2021-07-23 MED ORDER — TRUE METRIX BLOOD GLUCOSE TEST VI STRP: ORAL_STRIP | 12 refills | Status: AC

## 2021-07-23 MED ORDER — TRUEPLUS LANCETS 28G MISC: 4 refills | Status: AC

## 2021-07-23 MED ORDER — TRUE METRIX METER W/DEVICE KIT: PACK | 0 refills | Status: AC

## 2021-07-23 MED ORDER — GLIMEPIRIDE 1 MG PO TABS: 1.0000 mg | ORAL_TABLET | Freq: Two times a day (BID) | ORAL | 0 refills | Status: DC

## 2021-07-23 NOTE — Progress Notes (Signed)
Pt presents for diabetes follow-up accompanied by [facility]

## 2021-07-23 NOTE — Progress Notes (Signed)
Diabetes discussed in office.

## 2021-07-24 ENCOUNTER — Other Ambulatory Visit: Payer: Self-pay | Admitting: Family

## 2021-07-24 DIAGNOSIS — E1122 Type 2 diabetes mellitus with diabetic chronic kidney disease: Secondary | ICD-10-CM

## 2021-07-24 DIAGNOSIS — E119 Type 2 diabetes mellitus without complications: Secondary | ICD-10-CM | POA: Insufficient documentation

## 2021-07-24 LAB — BASIC METABOLIC PANEL
BUN/Creatinine Ratio: 15 (ref 12–28)
BUN/Creatinine Ratio: 18 (ref 12–28)
BUN: 25 mg/dL (ref 8–27)
BUN: 31 mg/dL — ABNORMAL HIGH (ref 8–27)
CO2: 24 mmol/L (ref 20–29)
CO2: 25 mmol/L (ref 20–29)
Calcium: 8.9 mg/dL (ref 8.7–10.3)
Calcium: 8.6 mg/dL — ABNORMAL LOW (ref 8.7–10.3)
Chloride: 107 mmol/L — ABNORMAL HIGH (ref 96–106)
Chloride: 105 mmol/L (ref 96–106)
Creatinine, Ser: 1.68 mg/dL — ABNORMAL HIGH (ref 0.57–1.00)
Creatinine, Ser: 1.69 mg/dL — ABNORMAL HIGH (ref 0.57–1.00)
Glucose: 135 mg/dL — ABNORMAL HIGH (ref 70–99)
Glucose: 209 mg/dL — ABNORMAL HIGH (ref 70–99)
Potassium: 4.3 mmol/L (ref 3.5–5.2)
Potassium: 4.4 mmol/L (ref 3.5–5.2)
Sodium: 144 mmol/L (ref 134–144)
Sodium: 142 mmol/L (ref 134–144)
eGFR: 33 mL/min/{1.73_m2} — ABNORMAL LOW (ref >59)
eGFR: 33 mL/min/{1.73_m2} — ABNORMAL LOW (ref >59)

## 2021-07-24 LAB — MAGNESIUM: Magnesium: 1.2 mg/dL — ABNORMAL LOW (ref 1.6–2.3)

## 2021-07-24 NOTE — Progress Notes (Signed)
Kidney function decreased since 5 months ago. Please call your kidney doctor Santiago Bumpers, MD and follow-up soon.  We will need to decrease Metformin to 500 mg twice daily because of decreased kidney function. Patient can take current 1000 mg tablet and cut in half using pill splitter. Also, a new referral placed to Endocrinology for further evaluation and management of diabetes. Their office should call patient within 2 weeks with appointment details.

## 2021-08-20 NOTE — Progress Notes (Signed)
Patient ID: Jennifer Molina, female    DOB: Mar 15, 1956  MRN: [id number]  CC: Diabetes Follow-Up   Subjective: Jennifer Molina is a 66 y.o. female who presents for diabetes follow-up.   Her concerns today include:  DIABETES TYPE 2 FOLLOW-UP: 07/23/2021: - Hemoglobin A1c today not at goal at 9.1%, goal < 8%. This is increased from previous 8.2% on 05/06/2021. - Continue Metformin as prescribed.  - Increase Glimepiride from 1 mg daily to 1 mg twice daily.  - Begin checking blood sugars at home and bring results to next appointment.   07/24/2021: Metformin decreased to 500 mg BID. Referred to Endocrinology.  08/28/2021: Doing well on current regimen. No issues/concerns. Has not heard from Endocrinology appointment as of present.   Patient Active Problem List   Diagnosis Date Noted   Diabetes mellitus ([place]) 07/24/2021   Hyperlipidemia 06/21/2021   Acute respiratory failure with hypoxia ([place]) 07/23/2019   Sepsis due to COVID-19 [facility]) 07/22/2019   Pneumonia due to COVID-19 virus 07/22/2019   Dementia ([place]) 03/28/2019   Chronic post-traumatic stress disorder (PTSD) 03/28/2019   DM (diabetes mellitus), type 2, uncontrolled 2015   Hypertension 2015     Current Outpatient Medications on File Prior to Visit  Medication Sig Dispense Refill   acetaminophen (TYLENOL) 500 MG tablet Take 1,000 mg by mouth every 6 (six) hours as needed for mild pain or fever.     amLODipine (NORVASC) 10 MG tablet Take 1 tablet (10 mg total) by mouth daily. 60 tablet 0   ASPIRIN LOW DOSE 81 MG EC tablet Take 1 tablet (81 mg total) by mouth daily. 30 tablet 2   atorvastatin (LIPITOR) 40 MG tablet Take 1 tablet (40 mg total) by mouth daily. 90 tablet 1   Blood Glucose Monitoring Suppl (TRUE METRIX METER) w/Device KIT Use as directed 1 kit 0   FEROSUL 325 (65 Fe) MG tablet Take 1 tablet (325 mg total) by mouth daily. 30 tablet 5   gabapentin (NEURONTIN) 300 MG capsule Take 1 capsule (300 mg total) by mouth at bedtime. 30  capsule 2   glimepiride (AMARYL) 1 MG tablet Take 1 tablet (1 mg total) by mouth 2 (two) times daily with a meal. 180 tablet 0   glucose blood (TRUE METRIX BLOOD GLUCOSE TEST) test strip Use as instructed 100 each 12   losartan (COZAAR) 100 MG tablet Take 1 tablet (100 mg total) by mouth daily. 60 tablet 0   metFORMIN (GLUCOPHAGE) 1000 MG tablet Take 1 tablet (1,000 mg total) by mouth 2 (two) times daily with a meal. 180 tablet 0   metoprolol tartrate (LOPRESSOR) 100 MG tablet Take 1 tablet (100 mg total) by mouth 2 (two) times daily. 120 tablet 0   spironolactone (ALDACTONE) 25 MG tablet Take 0.5 tablets (12.5 mg total) by mouth daily. 60 tablet 1   TRUEplus Lancets 28G MISC Use as directed 100 each 4   No current facility-administered medications on file prior to visit.    No Known Allergies  Social History   Socioeconomic History   Marital status: Married    Spouse name: [name]   Number of children: 7   Years of education: 3   Highest education level: 3rd grade  Occupational History   Occupation: Retired from farming in [place]  Tobacco Use   Smoking status: Never   Smokeless tobacco: Never  Vaping Use   Vaping Use: Never used  Substance and Sexual Activity   Alcohol use: Never   Drug  use: Never   Sexual activity: Not on file  Other Topics Concern   Not on file  Social History Narrative   [place] in [place]   Lives at home with husband   Her 2 daughters, 2 sons and their families   All together, 76 people in the home   Social Determinants of Health   Financial Resource Strain: Not on file  Food Insecurity: Not on file  Transportation Needs: Not on file  Physical Activity: Not on file  Stress: Not on file  Social Connections: Not on file  Intimate Partner Violence: Not on file    Family History  Problem Relation Age of Onset   Diabetes Mellitus II Neg Hx     No past surgical history on file.  ROS: Review of Systems Negative except as stated  above  PHYSICAL EXAM: BP (!) 156/71 (BP Location: Left Arm, Patient Position: Sitting, Cuff Size: Normal)    Pulse 75    Temp 98.3 F (36.8 C)    Resp 18    Ht 5' 0.98" (1.549 m)    Wt 152 lb (68.9 kg)    LMP  (LMP Unknown)    SpO2 98%    BMI 28.74 kg/m   Physical Exam HENT:     Head: Normocephalic and atraumatic.  Eyes:     Extraocular Movements: Extraocular movements intact.     Conjunctiva/sclera: Conjunctivae normal.     Pupils: Pupils are equal, round, and reactive to light.  Cardiovascular:     Rate and Rhythm: Normal rate and regular rhythm.     Pulses: Normal pulses.     Heart sounds: Normal heart sounds.  Pulmonary:     Effort: Pulmonary effort is normal.     Breath sounds: Normal breath sounds.  Musculoskeletal:     Cervical back: Normal range of motion and neck supple.  Neurological:     General: No focal deficit present.     Mental Status: She is alert and oriented to person, place, and time.  Psychiatric:        Mood and Affect: Mood normal.        Behavior: Behavior normal.    ASSESSMENT AND PLAN: 1. Type 2 diabetes mellitus without complication, without long-term current use of insulin ([place]): - Continue Metformin and Glimepiride as prescribed.  - Discussed the importance of healthy eating habits, low-carbohydrate diet, low-sugar diet, regular aerobic exercise (at least 150 minutes a week as tolerated) and medication compliance to achieve or maintain control of diabetes. - Patient's daughter provided with contact information to [facility] Address: [address] [place], [address]. Phone number: [phone].  2. Language barrier: - [place] in-person interpreter, Letta Pate, participated during today's appointment.  - Patient accompanied by her daughter Paquita Printy who serves as part-historian.       Patient was given the opportunity to ask questions.  Patient verbalized understanding of the plan and was able to repeat key  elements of the plan. Patient was given clear instructions to go to Emergency Department or return to medical center if symptoms don't improve, worsen, or new problems develop.The patient verbalized understanding.  Follow-up with primary provider as scheduled.   [name], NP

## 2021-08-27 ENCOUNTER — Other Ambulatory Visit: Payer: Self-pay

## 2021-08-27 ENCOUNTER — Inpatient Hospital Stay: Payer: Medicaid Other | Attending: Hematology

## 2021-08-27 DIAGNOSIS — D472 Monoclonal gammopathy: Secondary | ICD-10-CM | POA: Diagnosis present

## 2021-08-27 DIAGNOSIS — D649 Anemia, unspecified: Secondary | ICD-10-CM | POA: Diagnosis not present

## 2021-08-27 DIAGNOSIS — C9000 Multiple myeloma not having achieved remission: Secondary | ICD-10-CM

## 2021-08-27 LAB — CBC WITH DIFFERENTIAL (CANCER CENTER ONLY)
Abs Immature Granulocytes: 0.02 10*3/uL (ref 0.00–0.07)
Basophils Absolute: 0.1 10*3/uL (ref 0.0–0.1)
Basophils Relative: 1 %
Eosinophils Absolute: 0.3 10*3/uL (ref 0.0–0.5)
Eosinophils Relative: 4 %
HCT: 26.1 % — ABNORMAL LOW (ref 36.0–46.0)
Hemoglobin: 8.4 g/dL — ABNORMAL LOW (ref 12.0–15.0)
Immature Granulocytes: 0 %
Lymphocytes Relative: 26 %
Lymphs Abs: 1.7 10*3/uL (ref 0.7–4.0)
MCH: 25.8 pg — ABNORMAL LOW (ref 26.0–34.0)
MCHC: 32.2 g/dL (ref 30.0–36.0)
MCV: 80.1 fL (ref 80.0–100.0)
Monocytes Absolute: 0.6 10*3/uL (ref 0.1–1.0)
Monocytes Relative: 9 %
Neutro Abs: 3.9 10*3/uL (ref 1.7–7.7)
Neutrophils Relative %: 60 %
Platelet Count: 259 10*3/uL (ref 150–400)
RBC: 3.26 MIL/uL — ABNORMAL LOW (ref 3.87–5.11)
RDW: 15 % (ref 11.5–15.5)
WBC Count: 6.5 10*3/uL (ref 4.0–10.5)
nRBC: 0 % (ref 0.0–0.2)

## 2021-08-27 LAB — CMP (CANCER CENTER ONLY)
ALT: 18 U/L (ref 0–44)
AST: 12 U/L — ABNORMAL LOW (ref 15–41)
Albumin: 3.6 g/dL (ref 3.5–5.0)
Alkaline Phosphatase: 39 U/L (ref 38–126)
Anion gap: 5 (ref 5–15)
BUN: 25 mg/dL — ABNORMAL HIGH (ref 8–23)
CO2: 26 mmol/L (ref 22–32)
Calcium: 8.9 mg/dL (ref 8.9–10.3)
Chloride: 107 mmol/L (ref 98–111)
Creatinine: 1.27 mg/dL — ABNORMAL HIGH (ref 0.44–1.00)
GFR, Estimated: 46 mL/min — ABNORMAL LOW (ref >60)
Glucose, Bld: 238 mg/dL — ABNORMAL HIGH (ref 70–99)
Potassium: 4.2 mmol/L (ref 3.5–5.1)
Sodium: 138 mmol/L (ref 135–145)
Total Bilirubin: 0.4 mg/dL (ref 0.3–1.2)
Total Protein: 7.6 g/dL (ref 6.5–8.1)

## 2021-08-28 ENCOUNTER — Ambulatory Visit (INDEPENDENT_AMBULATORY_CARE_PROVIDER_SITE_OTHER): Payer: Medicaid Other | Admitting: Family

## 2021-08-28 ENCOUNTER — Encounter: Payer: Self-pay | Admitting: Family

## 2021-08-28 VITALS — BP 156/71 | HR 75 | Temp 98.3°F | Resp 18 | Ht 60.98 in | Wt 152.0 lb

## 2021-08-28 DIAGNOSIS — E119 Type 2 diabetes mellitus without complications: Secondary | ICD-10-CM | POA: Diagnosis not present

## 2021-08-28 DIAGNOSIS — Z789 Other specified health status: Secondary | ICD-10-CM | POA: Diagnosis not present

## 2021-08-28 LAB — KAPPA/LAMBDA LIGHT CHAINS
Kappa free light chain: 58 mg/L — ABNORMAL HIGH (ref 3.3–19.4)
Kappa, lambda light chain ratio: 2.65 — ABNORMAL HIGH (ref 0.26–1.65)
Lambda free light chains: 21.9 mg/L (ref 5.7–26.3)

## 2021-08-28 LAB — BETA 2 MICROGLOBULIN, SERUM: Beta-2 Microglobulin: 2.6 mg/L — ABNORMAL HIGH (ref 0.6–2.4)

## 2021-08-28 NOTE — Progress Notes (Signed)
Pt presents for diabetes follow-up  

## 2021-08-28 NOTE — Patient Instructions (Addendum)
Please call [facility] for appointment. Address: [address] [place], [address]. Phone number: [phone]. Glimepiride Tablets What is this medication? GLIMEPIRIDE (GLYE me pye ride) treats type 2 diabetes. It works by increasing insulin levels in your body, which decreases your blood sugar (glucose). It also helps your body use insulin more effectively. It belongs to a group of medications called sulfonylureas. Changes to diet and exercise are often combined with this medication. This medicine may be used for other purposes; ask your health care provider or pharmacist if you have questions. COMMON BRAND NAME(S): Amaryl What should I tell my care team before I take this medication? They need to know if you have any of these conditions: Diabetic ketoacidosis Glucose-6-phosphate dehydrogenase deficiency Heart disease Kidney disease Liver disease Severe infection or injury Thyroid disease An unusual or allergic reaction to glimepiride, sulfa medications, other medications, foods, dyes, or preservatives Pregnancy or recent attempts to get pregnant Breast-feeding How should I use this medication? Take this medication by mouth. Swallow with a drink of water. Follow the directions on the prescription label. Take your dose at the same time each day, with breakfast or your first large meal. Do not take more often than directed. Talk to your care team regarding the use of this medication in children. Special care may be needed. Elderly patients over 38 years old can have a stronger reaction and need a smaller dose. Overdosage: If you think you have taken too much of this medicine contact a poison control center or emergency room at once. NOTE: This medicine is only for you. Do not share this medicine with others. What if I miss a dose? If you miss a dose, take it as soon as you can. If it is almost time for your next dose, take only that dose. Do not take double or  extra doses. What may interact with this medication? Do not take this medication with any of the following: Bosentan Chloramphenicol Cisapride Clarithromycin Medications for fungal or yeast infections Metoclopramide Probenecid Warfarin This medication may also interact with the following: Alcohol containing beverages Aspirin and aspirin-like medications Chloramphenicol Chromium Female hormones, like estrogens or progestins and birth control pills Fluoxetine Heart medications like disopyramide Isoniazid Female hormones or anabolic steroids Medications called MAO Inhibitors like Nardil, Parnate, Marplan, Eldepryl Medications for allergies, asthma, cold, or cough Medications for mental problems Medications for weight loss Niacin NSAIDs, medications for pain and inflammation, like ibuprofen or naproxen Pentamidine Phenytoin Probenecid Quinolone antibiotics like ciprofloxacin, levofloxacin, ofloxacin Some herbal dietary supplements Steroid medications like prednisone or cortisone Thyroid medication Water pills (diuretics) This list may not describe all possible interactions. Give your health care provider a list of all the medicines, herbs, non-prescription drugs, or dietary supplements you use. Also tell them if you smoke, drink alcohol, or use illegal drugs. Some items may interact with your medicine. What should I watch for while using this medication? Visit your care team for regular checks on your progress. A test called the HbA1C (A1C) will be monitored. This is a simple blood test. It measures your blood sugar control over the last 2 to 3 months. You will receive this test every 3 to 6 months. Learn how to check your blood sugar. Learn the symptoms of low and high blood sugar and how to manage them. Always carry a quick-source of sugar with you in case you have symptoms of low blood sugar. Examples include hard sugar candy or glucose tablets. Make sure others know that  you can  choke if you eat or drink when you develop serious symptoms of low blood sugar, such as seizures or unconsciousness. They must get medical help at once. Tell your care team if you have high blood sugar. You might need to change the dose of your medication. If you are sick or exercising more than usual, you might need to change the dose of your medication. Do not skip meals. Ask your care team if you should avoid alcohol. Many nonprescription cough and cold products contain sugar or alcohol. These can affect blood sugar. This medication can make you more sensitive to the sun. Keep out of the sun. If you cannot avoid being in the sun, wear protective clothing and use sunscreen. Do not use sun lamps or tanning beds/booths. Wear a medical ID bracelet or chain, and carry a card that describes your disease and details of your medication and dosage times. What side effects may I notice from receiving this medication? Side effects that you should report to your care team as soon as possible: Allergic reactions--skin rash, itching, hives, swelling of the face, lips, tongue, or throat Low blood sugar (hypoglycemia)--tremors or shaking, anxiety, sweating, cold or clammy skin, confusion, dizziness, rapid heartbeat Side effects that usually do not require medical attention (report to your care team if they continue or are bothersome): Dizziness Headache Nausea This list may not describe all possible side effects. Call your doctor for medical advice about side effects. You may report side effects to FDA at 1-800-FDA-1088. Where should I keep my medication? Keep out of the reach of children and pets. Store at room temperature below 30 degrees C (86 degrees F). Throw away any unused medication after the expiration date. NOTE: This sheet is a summary. It may not cover all possible information. If you have questions about this medicine, talk to your doctor, pharmacist, or health care provider.  2022 Elsevier/Gold  Standard (2021-03-27 00:00:00)

## 2021-08-29 ENCOUNTER — Ambulatory Visit: Payer: Medicaid Other

## 2021-08-30 ENCOUNTER — Inpatient Hospital Stay (HOSPITAL_BASED_OUTPATIENT_CLINIC_OR_DEPARTMENT_OTHER): Payer: Medicaid Other | Admitting: Hematology

## 2021-08-30 ENCOUNTER — Other Ambulatory Visit: Payer: Self-pay

## 2021-08-30 ENCOUNTER — Inpatient Hospital Stay: Payer: Medicaid Other

## 2021-08-30 VITALS — BP 148/66 | HR 78 | Temp 97.5°F | Resp 18 | Wt 157.6 lb

## 2021-08-30 DIAGNOSIS — D472 Monoclonal gammopathy: Secondary | ICD-10-CM

## 2021-08-30 DIAGNOSIS — F4312 Post-traumatic stress disorder, chronic: Secondary | ICD-10-CM | POA: Diagnosis not present

## 2021-08-30 DIAGNOSIS — D649 Anemia, unspecified: Secondary | ICD-10-CM

## 2021-08-30 DIAGNOSIS — C9000 Multiple myeloma not having achieved remission: Secondary | ICD-10-CM

## 2021-08-30 LAB — CBC (CANCER CENTER ONLY)
HCT: 28.2 % — ABNORMAL LOW (ref 36.0–46.0)
Hemoglobin: 9.1 g/dL — ABNORMAL LOW (ref 12.0–15.0)
MCH: 25.9 pg — ABNORMAL LOW (ref 26.0–34.0)
MCHC: 32.3 g/dL (ref 30.0–36.0)
MCV: 80.1 fL (ref 80.0–100.0)
Platelet Count: 264 10*3/uL (ref 150–400)
RBC: 3.52 MIL/uL — ABNORMAL LOW (ref 3.87–5.11)
RDW: 15.2 % (ref 11.5–15.5)
WBC Count: 7 10*3/uL (ref 4.0–10.5)
nRBC: 0 % (ref 0.0–0.2)

## 2021-08-30 LAB — VITAMIN B12: Vitamin B-12: 357 pg/mL (ref 180–914)

## 2021-08-30 LAB — IRON AND IRON BINDING CAPACITY (CC-WL,HP ONLY)
Iron: 64 ug/dL (ref 28–170)
Saturation Ratios: 21 % (ref 10.4–31.8)
TIBC: 305 ug/dL (ref 250–450)
UIBC: 241 ug/dL (ref 148–442)

## 2021-08-30 LAB — FERRITIN: Ferritin: 489 ng/mL — ABNORMAL HIGH (ref 11–307)

## 2021-08-30 LAB — LACTATE DEHYDROGENASE: LDH: 163 U/L (ref 98–192)

## 2021-08-30 NOTE — Progress Notes (Addendum)
HEMATOLOGY/ONCOLOGY CLINIC NOTE  Date of Service: .08/30/2021   Patient Care Team: [name], NP as PCP - General (Nurse Practitioner) [name], MD (Nephrology) [name], MD as Consulting Physician (Hematology) Dr [name] Nephrology [name] MD - cards Referred to Endocrinology  CHIEF COMPLAINTS/PURPOSE OF CONSULTATION:  Follow-up for monoclonal gammopathy  HISTORY OF PRESENTING ILLNESS:  Please see previous notes for details of initial presentation  Interval History  Jennifer Molina is is here for continued evaluation and management of her monoclonal paraproteinemia .  She is accompanied by her daughter and interpreter . She notes no acute new symptoms since her last clinic visit. Labs done today showed hemoglobin of 9.1, WBC count of 7k and platelets of 264k. Iron saturation of 21% with a ferritin of 489 Haptoglobin 189, LDH 163 Erythropoietin 27.4 B12 357 CMP shows stable creatinine of 1.27 Myeloma panel shows stable M spike of 1.5 g/dL Kappa lambda free light chains stable. Patient is following with Dr. [name] for nephrology. Her daughter notes that she has been referred to endocrinology for optimizing her diabetes She also follows with Dr. [name] cardiology for optimization of her hypertension.  No new focal bone pains or other acute issues at this time.   MEDICAL HISTORY:  Past Medical History:  Diagnosis Date   Dementia ([place]) 03/28/2019   DM (diabetes mellitus), type 2, uncontrolled 2015   Has poor vision.  Family not aware of A1C or what sugars run.  Believe not well controlled.  Not able to monitor due to cost of monitoring equipment   Hypertension 2015    SURGICAL HISTORY: No past surgical history on file.  SOCIAL HISTORY: Social History   Socioeconomic History   Marital status: Married    Spouse name: [name]   Number of children: 7   Years of education: 3   Highest education level: 3rd grade  Occupational  History   Occupation: Retired from farming in [place]  Tobacco Use   Smoking status: Never   Smokeless tobacco: Never  Vaping Use   Vaping Use: Never used  Substance and Sexual Activity   Alcohol use: Never   Drug use: Never   Sexual activity: Not on file  Other Topics Concern   Not on file  Social History Narrative   [place] in [place]   Lives at home with husband   Her 2 daughters, 2 sons and their families   All together, 38 people in the home   Social Determinants of Health   Financial Resource Strain: Not on file  Food Insecurity: Not on file  Transportation Needs: Not on file  Physical Activity: Not on file  Stress: Not on file  Social Connections: Not on file  Intimate Partner Violence: Not on file    FAMILY HISTORY: Family History  Problem Relation Age of Onset   Diabetes Mellitus II Neg Hx     ALLERGIES:  has No Known Allergies.  MEDICATIONS:  Current Outpatient Medications  Medication Sig Dispense Refill   acetaminophen (TYLENOL) 500 MG tablet Take 1,000 mg by mouth every 6 (six) hours as needed for mild pain or fever.     amLODipine (NORVASC) 10 MG tablet Take 1 tablet (10 mg total) by mouth daily. 60 tablet 0   ASPIRIN LOW DOSE 81 MG EC tablet Take 1 tablet (81 mg total) by mouth daily. 30 tablet 2   atorvastatin (LIPITOR) 40 MG tablet Take 1 tablet (40 mg total) by mouth daily.  90 tablet 1   Blood Glucose Monitoring Suppl (TRUE METRIX METER) w/Device KIT Use as directed 1 kit 0   FEROSUL 325 (65 Fe) MG tablet Take 1 tablet (325 mg total) by mouth daily. 30 tablet 5   gabapentin (NEURONTIN) 300 MG capsule Take 1 capsule (300 mg total) by mouth at bedtime. 30 capsule 2   glimepiride (AMARYL) 1 MG tablet Take 1 tablet (1 mg total) by mouth 2 (two) times daily with a meal. 180 tablet 0   glucose blood (TRUE METRIX BLOOD GLUCOSE TEST) test strip Use as instructed 100 each 12   losartan (COZAAR) 100 MG tablet Take 1 tablet (100 mg total) by mouth daily. 60  tablet 0   metFORMIN (GLUCOPHAGE) 1000 MG tablet Take 1 tablet (1,000 mg total) by mouth 2 (two) times daily with a meal. 180 tablet 0   metoprolol tartrate (LOPRESSOR) 100 MG tablet Take 1 tablet (100 mg total) by mouth 2 (two) times daily. 120 tablet 0   spironolactone (ALDACTONE) 25 MG tablet Take 0.5 tablets (12.5 mg total) by mouth daily. 60 tablet 1   TRUEplus Lancets 28G MISC Use as directed 100 each 4   No current facility-administered medications for this visit.    REVIEW OF SYSTEMS:   .10 Point review of Systems was done is negative except as noted above.  PHYSICAL EXAMINATION: ECOG PERFORMANCE STATUS: 1 - Symptomatic but completely ambulatory  . Vitals:   08/30/21 0900  BP: (!) 148/66  Pulse: 78  Resp: 18  Temp: (!) 97.5 F (36.4 C)  SpO2: 97%    Filed Weights   08/30/21 0900  Weight: 157 lb 9.6 oz (71.5 kg)    .Body mass index is 29.79 kg/m. [place] Kitchen GENERAL:alert, in no acute distress and comfortable SKIN: no acute rashes, no significant lesions EYES: conjunctiva are pink and non-injected, sclera anicteric OROPHARYNX: MMM, no exudates, no oropharyngeal erythema or ulceration NECK: supple, no JVD LYMPH:  no palpable lymphadenopathy in the cervical, axillary or inguinal regions LUNGS: clear to auscultation b/l with normal respiratory effort HEART: regular rate & rhythm ABDOMEN:  normoactive bowel sounds , non tender, not distended. Extremity: no pedal edema PSYCH: alert & oriented x 3 with fluent speech NEURO: no focal motor/sensory deficits   LABORATORY DATA:  I have reviewed the data as listed  . CBC Latest Ref Rng & Units 08/30/2021 08/27/2021 02/14/2021  WBC 4.0 - 10.5 K/uL 7.0 6.5 5.7  Hemoglobin 12.0 - 15.0 g/dL 9.1(L) 8.4(L) 9.8(L)  Hematocrit 36.0 - 46.0 % 28.2(L) 26.1(L) 30.6(L)  Platelets 150 - 400 K/uL 264 259 234    . CMP Latest Ref Rng & Units 08/27/2021 07/24/2021 07/23/2021  Glucose 70 - 99 mg/dL 238(H) 209(H) 135(H)  BUN 8 - 23 mg/dL 25(H)  31(H) 25  Creatinine 0.44 - 1.00 mg/dL 1.27(H) 1.69(H) 1.68(H)  Sodium 135 - 145 mmol/L 138 142 144  Potassium 3.5 - 5.1 mmol/L 4.2 4.4 4.3  Chloride 98 - 111 mmol/L 107 105 107(H)  CO2 22 - 32 mmol/L 26 25 24   Calcium 8.9 - 10.3 mg/dL 8.9 8.6(L) 8.9  Total Protein 6.5 - 8.1 g/dL 7.6 - -  Total Bilirubin 0.3 - 1.2 mg/dL 0.4 - -  Alkaline Phos 38 - 126 U/L 39 - -  AST 15 - 41 U/L 12(L) - -  ALT 0 - 44 U/L 18 - -   . Lab Results  Component Value Date   IRON 64 08/30/2021   TIBC 305 08/30/2021  IRONPCTSAT 21 08/30/2021   (Iron and TIBC)  Lab Results  Component Value Date   FERRITIN 489 (H) 08/30/2021     09/24/2020 Surgical Pathology DIAGNOSIS:   BONE MARROW, ASPIRATE, CLOT, CORE:  -Slightly hypercellular bone marrow for age with plasma cell neoplasm  -See comment   PERIPHERAL BLOOD:  -Normocytic-normochromic anemia   COMMENT:   The bone marrow is hypercellular for age with slight increase in plasma  cells representing 8% of all cells in the aspirate with lack of large  aggregates or sheets.  Immunohistochemical stains highlight the  increased plasma cell component in the clot/biopsy sections correlating  with the aspirate.  The plasma cells display kappa light chain  restriction consistent with plasma cell neoplasm.  Correlation with  cytogenetic and FISH studies recommended.   09/24/2020 Molecular Pathology    09/24/2020 Cytogenetics Report    RADIOGRAPHIC STUDIES: I have personally reviewed the radiological images as listed and agreed with the findings in the report. No results found.   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 Iron levels adequate. Will likely need consideration of erythropoietin if hemoglobin remains less than 9.  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 [name]  PLAN: Discussed her lab results in detail Labs done today showed hemoglobin of 9.1, WBC count of 7k and platelets of 264k. Iron saturation of 21% with a ferritin of 489 Haptoglobin 189, LDH 163 Erythropoietin 27.4 B12 357 CMP shows stable creatinine of 1.27 Myeloma panel shows stable M spike of 1.5 g/dL Kappa lambda free light chains stable.  -No indication for progression of her MGUS based on her myeloma labs and stable M spike. -He should continue to follow with her PCP for optimization of her hypertension and diabetes.  She will need monitoring and management of her cognitive impairment and appropriate services/medications to address this with her PCP. -Kidney function appears stable continue follow-up with Dr. [name].  Will defer to Dr. Zenda Alpers regarding need for additional renal evaluation with kidney biopsy. -We will need consideration of people if hemoglobin less than 9. -No indication for IV iron at this time  FOLLOW UP: Labs today RTC with Dr Irene Limbo In 19month (Labs 1 week prior to this clinic visit)    All of the patients questions were answered with apparent satisfaction. The patient knows to call the clinic with any problems, questions or concerns.    GSullivan LoneMD MSackets HarborAAHIVMS SPiedmont Outpatient Surgery CenterCMemorial Hermann Pearland HospitalHematology/Oncology Physician [facility]

## 2021-08-31 LAB — ERYTHROPOIETIN: Erythropoietin: 27.4 m[IU]/mL — ABNORMAL HIGH (ref 2.6–18.5)

## 2021-08-31 LAB — HAPTOGLOBIN: Haptoglobin: 189 mg/dL (ref 37–355)

## 2021-09-02 LAB — MULTIPLE MYELOMA PANEL, SERUM
Albumin SerPl Elph-Mcnc: 3.2 g/dL (ref 2.9–4.4)
Albumin/Glob SerPl: 0.9 (ref 0.7–1.7)
Alpha 1: 0.2 g/dL (ref 0.0–0.4)
Alpha2 Glob SerPl Elph-Mcnc: 0.8 g/dL (ref 0.4–1.0)
B-Globulin SerPl Elph-Mcnc: 0.8 g/dL (ref 0.7–1.3)
Gamma Glob SerPl Elph-Mcnc: 2.1 g/dL — ABNORMAL HIGH (ref 0.4–1.8)
Globulin, Total: 4 g/dL — ABNORMAL HIGH (ref 2.2–3.9)
IgA: 203 mg/dL (ref 87–352)
IgG (Immunoglobin G), Serum: 2188 mg/dL — ABNORMAL HIGH (ref 586–1602)
IgM (Immunoglobulin M), Srm: 77 mg/dL (ref 26–217)
M Protein SerPl Elph-Mcnc: 1.5 g/dL — ABNORMAL HIGH
Total Protein ELP: 7.2 g/dL (ref 6.0–8.5)

## 2021-09-03 ENCOUNTER — Telehealth: Payer: Self-pay | Admitting: Hematology

## 2021-09-03 ENCOUNTER — Other Ambulatory Visit (HOSPITAL_COMMUNITY): Payer: Medicaid Other

## 2021-09-03 ENCOUNTER — Encounter (HOSPITAL_COMMUNITY): Payer: Self-pay | Admitting: Internal Medicine

## 2021-09-03 NOTE — Telephone Encounter (Signed)
Left message with follow-up appointments per 11/3 los. 

## 2021-09-16 ENCOUNTER — Other Ambulatory Visit: Payer: Self-pay

## 2021-09-16 ENCOUNTER — Ambulatory Visit (HOSPITAL_COMMUNITY): Payer: Medicaid Other | Attending: Internal Medicine

## 2021-09-16 DIAGNOSIS — R9431 Abnormal electrocardiogram [ECG] [EKG]: Secondary | ICD-10-CM | POA: Diagnosis present

## 2021-09-16 LAB — ECHOCARDIOGRAM COMPLETE
Area-P 1/2: 3.72 cm2
S' Lateral: 2.6 cm

## 2021-09-20 ENCOUNTER — Encounter: Payer: Self-pay | Admitting: Endocrinology

## 2021-09-20 ENCOUNTER — Ambulatory Visit
Admission: RE | Admit: 2021-09-20 | Discharge: 2021-09-20 | Disposition: A | Discharge status: Home / Self Care | Payer: Medicaid Other | Source: Ambulatory Visit | Attending: Family | Admitting: Family

## 2021-09-20 DIAGNOSIS — Z1231 Encounter for screening mammogram for malignant neoplasm of breast: Secondary | ICD-10-CM

## 2021-09-20 NOTE — Progress Notes (Signed)
Mammogram with no evidence of malignancy. Repeat mammogram in 12 months.

## 2021-10-09 ENCOUNTER — Encounter: Payer: Self-pay | Admitting: Endocrinology

## 2021-10-09 ENCOUNTER — Other Ambulatory Visit: Payer: Self-pay

## 2021-10-09 ENCOUNTER — Ambulatory Visit: Payer: Medicaid Other | Admitting: Endocrinology

## 2021-10-09 VITALS — BP 176/100 | HR 82 | Ht 60.0 in | Wt 159.6 lb

## 2021-10-09 DIAGNOSIS — J9601 Acute respiratory failure with hypoxia: Secondary | ICD-10-CM | POA: Diagnosis not present

## 2021-10-09 DIAGNOSIS — N1832 Chronic kidney disease, stage 3b: Secondary | ICD-10-CM

## 2021-10-09 DIAGNOSIS — E1122 Type 2 diabetes mellitus with diabetic chronic kidney disease: Secondary | ICD-10-CM

## 2021-10-09 LAB — POCT GLYCOSYLATED HEMOGLOBIN (HGB A1C): Hemoglobin A1C: 8 % — AB (ref 4.0–5.6)

## 2021-10-09 MED ORDER — ACCU-CHEK GUIDE VI STRP: 1.0000 | ORAL_STRIP | Freq: Every day | 3 refills | Status: DC

## 2021-10-09 MED ORDER — DAPAGLIFLOZIN PROPANEDIOL 5 MG PO TABS: 5.0000 mg | ORAL_TABLET | Freq: Every day | ORAL | 3 refills | Status: DC

## 2021-10-09 NOTE — Patient Instructions (Addendum)
good diet and exercise significantly improve the control of your diabetes.  please let me know if you wish to be referred to a dietician.  high blood sugar is very risky to your health.  you should see an eye doctor and dentist every year.  It is very important to get all recommended vaccinations.  Controlling your blood pressure and cholesterol drastically reduces the damage diabetes does to your body.  Those who smoke should quit.  Please discuss these with your doctor.  Here is a new meter.  I have sent a prescription to your pharmacy, for strips.  check your blood sugar once a day.  vary the time of day when you check, between before the 3 meals, and at bedtime.  also check if you have symptoms of your blood sugar being too high or too low.  please keep a record of the readings and bring it to your next appointment here (or you can bring the meter itself).  You can write it on any piece of paper.  please call us sooner if your blood sugar goes below 70, or if most of your readings are over 200.  I have sent a prescription to your pharmacy, to add [place].   Please continue the same other medications.  Please come back for a follow-up appointment in 2 months.    ch? ?? ?n u?ng t?t v t?p th? d?c c?i thi?n ?ng k? vi?c ki?m sot b?nh ti?u ???ng c?a b?n. xin vui lng cho ti bi?t n?u b?n mu?n ???c gi?i thi?u ??n m?t chuyn gia dinh d??ng. l??ng ???ng trong mu cao l r?t nguy hi?m cho s?c kh?e c?a b?n. b?n nn ?i khm bc s? nhn khoa v nha s? hng n?m. ?i?u r?t quan tr?ng l ph?i tim t?t c? cc lo?i v?c-xin ???c khuy?n ngh?. Ki?m sot huy?t p v cholesterol c?a b?n lm gi?m ?ng k? thi?t h?i m b?nh ti?u ???ng gy ra cho c? th? b?n. Nh?ng ng??i ht thu?c nn b? thu?c l. Hy th?o lu?n nh?ng ?i?u ny v?i bc s? c?a b?n. ?y l m?t mt m?i. Ti ? g?i m?t ??n thu?c ??n hi?u thu?c c?a b?n, cho cc d?i. ki?m tra l??ng ???ng trong mu c?a b?n m?i ngy m?t l?n. thay ??i th?i gian trong ngy khi b?n ki?m  tra, gi?a tr??c 3 b?a ?n v tr??c khi ?i ng?. ??ng th?i ki?m tra xem b?n c cc tri?u ch?ng c?a l??ng ???ng trong mu qu cao ho?c qu th?p hay khng. vui lng ghi l?i cc l?n ??c v mang ??n cu?c h?n ti?p theo c?a b?n t?i ?y (ho?c b?n c th? t? mang theo my ?o). B?n c th? vi?t n trn b?t k? m?nh gi?y no. vui lng g?i cho chng ti s?m h?n n?u l??ng ???ng trong mu c?a b?n xu?ng d??i 70 ho?c n?u h?u h?t cc ch? s? c?a b?n ??u trn 200. Ti ? g?i ??n thu?c ??n hi?u thu?c c?a b?n, ?? thm Farxiga Hy ti?p t?c cc lo?i thu?c khc. Xin h?n ti khm sau 2 thng.

## 2021-10-09 NOTE — Progress Notes (Addendum)
Subjective:    Patient ID: Jennifer Molina, female    DOB: 16-Jul-1956, 66 y.o.   MRN: [id number]  HPI Video interpreter pt is referred by Durene Fruits, NP, for diabetes.  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  She works M-F, 8AM-2PM.   Past Medical History:  Diagnosis Date   Dementia ([place]) 03/28/2019   DM (diabetes mellitus), type 2, uncontrolled 2015   Has poor vision.  Family not aware of A1C or what sugars run.  Believe not well controlled.  Not able to monitor due to cost of monitoring equipment   Hypertension 2015    No past surgical history on file.  Social History   Socioeconomic History   Marital status: Married    Spouse name: [name]   Number of children: 7   Years of education: 3   Highest education level: 3rd grade  Occupational History   Occupation: Retired from farming in [place]  Tobacco Use   Smoking status: Never   Smokeless tobacco: Never  Vaping Use   Vaping Use: Never used  Substance and Sexual Activity   Alcohol use: Never   Drug use: Never   Sexual activity: Not on file  Other Topics Concern   Not on file  Social History Narrative   [place] to [place] in [place]   Lives at home with husband   Her 2 daughters, 2 sons and their families   All together, 64 people in the home   Social Determinants of Health   Financial Resource Strain: Not on file  Food Insecurity: Not on file  Transportation Needs: Not on file  Physical Activity: Not on file  Stress: Not on file  Social Connections: Not on file  Intimate Partner Violence: Not on file    Current Outpatient Medications on File Prior to Visit  Medication Sig Dispense Refill   acetaminophen (TYLENOL) 500 MG tablet Take 1,000 mg by mouth every 6 (six) hours as needed for mild pain or fever.     ASPIRIN LOW DOSE 81  MG EC tablet Take 1 tablet (81 mg total) by mouth daily. 30 tablet 2   atorvastatin (LIPITOR) 40 MG tablet Take 1 tablet (40 mg total) by mouth daily. 90 tablet 1   Blood Glucose Monitoring Suppl (TRUE METRIX METER) w/Device KIT Use as directed 1 kit 0   FEROSUL 325 (65 Fe) MG tablet Take 1 tablet (325 mg total) by mouth daily. 30 tablet 5   gabapentin (NEURONTIN) 300 MG capsule Take 1 capsule (300 mg total) by mouth at bedtime. 30 capsule 2   glimepiride (AMARYL) 1 MG tablet Take 1 tablet (1 mg total) by mouth 2 (two) times daily with a meal. 180 tablet 0   glucose blood (TRUE METRIX BLOOD GLUCOSE TEST) test strip Use as instructed 100 each 12   metFORMIN (GLUCOPHAGE) 1000 MG tablet Take 1 tablet (1,000 mg total) by mouth 2 (two) times daily with a meal. 180 tablet 0   TRUEplus Lancets 28G MISC Use as directed 100 each 4   amLODipine (NORVASC) 10 MG tablet Take 1 tablet (10 mg total) by mouth daily. 60 tablet 0   losartan (COZAAR) 100 MG tablet Take 1 tablet (100 mg total) by mouth daily. 60 tablet 0   metoprolol tartrate (  LOPRESSOR) 100 MG tablet Take 1 tablet (100 mg total) by mouth 2 (two) times daily. 120 tablet 0   spironolactone (ALDACTONE) 25 MG tablet Take 0.5 tablets (12.5 mg total) by mouth daily. 60 tablet 1   No current facility-administered medications on file prior to visit.    No Known Allergies  Family History  Problem Relation Age of Onset   Diabetes Mother    Diabetes Mellitus II Neg Hx     BP (!) 176/100    Pulse 82    Ht 5' (1.524 m)    Wt 159 lb 9.6 oz (72.4 kg)    LMP  (LMP Unknown)    SpO2 98%    BMI 31.17 kg/m     Review of Systems denies weight loss, sob, n/v, memory loss.      Objective:   Physical Exam VITAL SIGNS:  See vs page GENERAL: no distress   MSK: no deformity of the feet CV: no leg edema Skin:  no ulcer on the feet, but there are heavy calluses  normal color and temp on the feet.   Neuro: sensation is intact to touch on the feet, but  decreased from normal.    Lab Results  Component Value Date   CREATININE 1.27 (H) 08/27/2021   BUN 25 (H) 08/27/2021   NA 138 08/27/2021   K 4.2 08/27/2021   CL 107 08/27/2021   CO2 26 08/27/2021   Lab Results  Component Value Date   HGBA1C 8.0 (A) 10/09/2021   I have reviewed outside records, and summarized: Pt was noted to have elevated A1c, and referred here. Glimepiride was increased.      Assessment & Plan:  Type 2 DM: uncontrolled  Patient Instructions  good diet and exercise significantly improve the control of your diabetes.  please let me know if you wish to be referred to a dietician.  high blood sugar is very risky to your health.  you should see an eye doctor and dentist every year.  It is very important to get all recommended vaccinations.  Controlling your blood pressure and cholesterol drastically reduces the damage diabetes does to your body.  Those who smoke should quit.  Please discuss these with your doctor.  Here is a new meter.  I have sent a prescription to your pharmacy, for strips.  check your blood sugar once a day.  vary the time of day when you check, between before the 3 meals, and at bedtime.  also check if you have symptoms of your blood sugar being too high or too low.  please keep a record of the readings and bring it to your next appointment here (or you can bring the meter itself).  You can write it on any piece of paper.  please call us sooner if your blood sugar goes below 70, or if most of your readings are over 200.  I have sent a prescription to your pharmacy, to add [place].   Please continue the same other medications.  Please come back for a follow-up appointment in 2 months.    ch? ?? ?n u?ng t?t v t?p th? d?c c?i thi?n ?ng k? vi?c ki?m sot b?nh ti?u ???ng c?a b?n. xin vui lng cho ti bi?t n?u b?n mu?n ???c gi?i thi?u ??n m?t chuyn gia dinh d??ng. l??ng ???ng trong mu cao l r?t nguy hi?m cho s?c kh?e c?a b?n. b?n nn ?i khm bc s? nhn  khoa v nha s? hng n?m. ?i?u r?t [place]  tr?ng l ph?i tim t?t c? cc lo?i v?c-xin ???c khuy?n ngh?. Ki?m sot huy?t p v cholesterol c?a b?n lm gi?m ?ng k? thi?t h?i m b?nh ti?u ???ng gy ra cho c? th? b?n. Nh?ng ng??i ht thu?c nn b? thu?c l. Hy th?o lu?n nh?ng ?i?u ny v?i bc s? c?a b?n. ?y l m?t mt m?i. Ti ? g?i m?t ??n thu?c ??n hi?u thu?c c?a b?n, cho cc d?i. ki?m tra l??ng ???ng trong mu c?a b?n m?i ngy m?t l?n. thay ??i th?i gian trong ngy khi b?n ki?m tra, gi?a tr??c 3 b?a ?n v tr??c khi ?i ng?. ??ng th?i ki?m tra xem b?n c cc tri?u ch?ng c?a l??ng ???ng trong mu qu cao ho?c qu th?p hay khng. vui lng ghi l?i cc l?n ??c v mang ??n cu?c h?n ti?p theo c?a b?n t?i ?y (ho?c b?n c th? t? mang theo my ?o). B?n c th? vi?t n trn b?t k? m?nh gi?y no. vui lng g?i cho chng ti s?m h?n n?u l??ng ???ng trong mu c?a b?n xu?ng d??i 70 ho?c n?u h?u h?t cc ch? s? c?a b?n ??u trn 200. Ti ? g?i ??n thu?c ??n hi?u thu?c c?a b?n, ?? thm Farxiga Hy ti?p t?c cc lo?i thu?c khc. Xin h?n ti khm sau 2 thng.

## 2021-10-23 ENCOUNTER — Ambulatory Visit: Payer: Medicaid Other | Admitting: Internal Medicine

## 2021-10-25 ENCOUNTER — Ambulatory Visit (INDEPENDENT_AMBULATORY_CARE_PROVIDER_SITE_OTHER): Payer: Medicaid Other | Admitting: Internal Medicine

## 2021-10-25 ENCOUNTER — Other Ambulatory Visit: Payer: Self-pay

## 2021-10-25 ENCOUNTER — Encounter: Payer: Self-pay | Admitting: Internal Medicine

## 2021-10-25 VITALS — BP 188/100 | HR 85 | Ht 63.0 in | Wt 156.6 lb

## 2021-10-25 DIAGNOSIS — I10 Essential (primary) hypertension: Secondary | ICD-10-CM

## 2021-10-25 MED ORDER — SPIRONOLACTONE 50 MG PO TABS: 50.0000 mg | ORAL_TABLET | Freq: Every day | ORAL | 3 refills | Status: DC

## 2021-10-25 NOTE — Patient Instructions (Signed)
Medication Instructions:  ?INCREASE SPIRONOLACTONE TO '50mg'$  ONCE DAILY  ?*If you need a refill on your cardiac medications before your next appointment, please call your pharmacy* ? ?Lab Work: ?Please return for Blood Work in [employer]. No appointment needed, lab here at the office is open Monday-Friday from 8AM to 4PM and closed daily for lunch from 12:45-1:45.  ? ?If you have labs (blood work) drawn today and your tests are completely normal, you will receive your results only by: ?MyChart Message (if you have MyChart) OR ?A paper copy in the mail ?If you have any lab test that is abnormal or we need to change your treatment, we will call you to review the results. ? ?Follow-Up: ?At [facility], you and your health needs are our priority.  As part of our continuing mission to provide you with exceptional heart care, we have created designated Provider Care Teams.  These Care Teams include your primary Cardiologist (physician) and Advanced Practice Providers (APPs -  Physician Assistants and Nurse Practitioners) who all work together to provide you with the care you need, when you need it. ? ?PLEASE SCHEDULE APPOINTMENT WITH [facility]- (PHARMACY) IN 3 MONTHS ? ?Your next appointment:   ?6 month(s) ? ?The format for your next appointment:   ?In Person ? ?Provider:   ?[name], MD   ? ?Other Instructions ? ?PLEASE MONITOR BP DAILY- WRITE THESE DOWN AND BRING TO NEXT APPOINTMENT WITH PHARMACY  ?

## 2021-10-25 NOTE — Progress Notes (Signed)
?Cardiology Office Note:   ? ?Date:  10/25/2021  ? ?[name], DOB 10-05-1955, MRN [id number] ? ?PCP:  [name], NP ?  ?[facility] Providers ?Cardiologist:  None    ? ?Referring MD: [name], NP  ? ?No chief complaint on file. ?Hypertension management ? ?History of Present Illness:   ? ?Jennifer Molina is a 66 y.o. female with a hx of HTN, T2DM, HLD, referral for hypertension management, interview with an interpreter ? ?She was seen on 24-Jun-2023 by her primary provider. Her BP was 197/103 with prior 180/90 and 173/95. She was asymptomatic. She was given clonidine. Her Bps have been in the 200s. Today, she comes in with an interpreter. She just started losartan 100 mg and amlodipine 10 mg daily.  She takes at home but not sure. She does not write down the blood pressure values. She denies heart disease in the past. She does not smoke. No cardiac stress test. Has not seen cardiology. 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 She is standing during the day. She denies chest pain or shortness of breath. She denies palpitations. She denies LH, dizziness, or syncope.  She an episode where she felt it was hard to breath, she was given her medicine and it resolved. This occurred in February. She  denies heart disease in parents. She has no siblings. Her son had a heart attack and he died in [place]. ? ? ?23-Jun-2021 ?Crt 1.3 ?K 4.5 ?LDL 80 ?TC 157 ?TSH 2.2 ?Hgb 9.8 ? ? ? ?Interim hx: ?Presented for help with hypertension. Her echo was normal. Did not take her blood pressure medication today. It's 188/100 mmHg. 136 to 140s low range up to 170s. She denies chest pain or SOB. No orthopnea, PND, or LE edema.  ? ?Cardiology Studies ?TTE- EF normal, GLS -18%,  RV function, no valve dx ? ?Past Medical History:  ?Diagnosis Date  ? Dementia ([place]) 03/28/2019  ? DM (diabetes mellitus), type 2, uncontrolled 2015  ? Has poor vision.  Family not aware of A1C or what sugars run.  Believe not well  controlled.  Not able to monitor due to cost of monitoring equipment  ? Hypertension 2015  ? ? ?History reviewed. No pertinent surgical history. ? ?Current Medications: ?No outpatient medications have been marked as taking for the 10/25/21 encounter (Appointment) with [name], MD.  ?  ? ?Allergies:   Patient has no known allergies.  ? ?Social History  ? ?Socioeconomic History  ? Marital status: Married  ?  Spouse name: [name]  ? Number of children: 7  ? Years of education: 3  ? Highest education level: 3rd grade  ?Occupational History  ? 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 Smoking status: Never  ? Smokeless tobacco: Never  ?Vaping Use  ? Vaping Use: Never used  ?Substance and Sexual Activity  ? Alcohol use: Never  ? Drug use: Never  ? Sexual activity: Not on file  ?Other Topics Concern  ? Not on file  ?Social History Narrative  ? [place] in [place]  ? Lives at home with husband  ? Her 2 daughters, 2 sons and their families  ? All together, 11 people in the home  ? ?Social Determinants of Health  ? ?Financial Resource Strain: Not on file  ?Food Insecurity: Not on file  ?Transportation Needs: Not on file  ?Physical Activity: Not on file  ?  Stress: Not on file  ?Social Connections: Not on file  ?  ? ?Family History: ?The patient's family history includes Diabetes in her mother. There is no history of Diabetes Mellitus II. ? ?ROS:   ?Please see the history of present illness.    ? All other systems reviewed and are negative. ? ?EKGs/Labs/Other Studies Reviewed:   ? ?The following studies were reviewed today: ? ? ?EKG:  EKG is  ordered today.  The ekg ordered today demonstrates  ? ?07/09/2021-NSR, septal Q waves ? ?Recent Labs: ?06/12/2021: TSH 2.240 ?07/24/2021: Magnesium 1.2 ?08/27/2021: ALT 18; BUN 25; Creatinine 1.27; Potassium 4.2; Sodium 138 ?08/30/2021: Hemoglobin 9.1; Platelet Count 264  ?Recent Lipid Panel ?   ?Component Value Date/Time  ? CHOL 157 06/19/2021 1125  ? TRIG 218 (H)  06/19/2021 1125  ? HDL 41 06/19/2021 1125  ? CHOLHDL 3.8 06/19/2021 1125  ? [place] 80 06/19/2021 1125  ? ? ? ?Risk Assessment/Calculations:   ?  ? ?    ? ?Physical Exam:   ? ?VS ? ?Vitals:  ? 10/25/21 0806  ?BP: (!) 188/100  ?Pulse: 85  ?SpO2: 99%  ? ? ? ?Wt Readings from Last 3 Encounters:  ?10/09/21 159 lb 9.6 oz (72.4 kg)  ?08/30/21 157 lb 9.6 oz (71.5 kg)  ?08/28/21 152 lb (68.9 kg)  ?  ? ?GEN:  Well nourished, well developed in no acute distress ?HEENT: Normal ?NECK: No JVD; No carotid bruits ?LYMPHATICS: No lymphadenopathy ?CARDIAC: RRR, no murmurs, rubs, gallops ?RESPIRATORY:  Clear to auscultation without rales, wheezing or rhonchi  ?ABDOMEN: Soft, non-tender, non-distended ?MUSCULOSKELETAL:  No edema; No deformity  ?SKIN: Warm and dry ?NEUROLOGIC:  Alert and oriented x 3 ?PSYCHIATRIC:  Normal affect  ? ?ASSESSMENT:   ? ?#Resistant Hypertension: She is on 3  BP medications and blood pressure is not at goal. She had no signs of pheo. Her TTE showed normal LV function. I instructed her and her daughter to continue to take her blood pressures at home and write them down. They are to bring in the log at her follow up visits. ?- increase spironolactone 12.5 mg daily to 50 mg daily ?- continue metoprolol 100 mg daily ?- continue norvasc 10 mg daily ? ?CVD Risk Mitigation: continue atorvastatin 40 mg. LDL 80 mg/dL ? ? ?PLAN:   ? ?In order of problems listed above: ? ?Increase spironolactone 50 mg  ?Pharmacy blood pressure clinic in 3 months ?Follow up in 6 months ? ?   ? ?   ? ?Medication Adjustments/Labs and Tests Ordered: ?Current medicines are reviewed at length with the patient today.  Concerns regarding medicines are outlined above.  ? ?Signed, ?[name], MD  ?10/25/2021 8:04 AM    ?[place] ? ?

## 2021-11-04 ENCOUNTER — Other Ambulatory Visit: Payer: Self-pay | Admitting: Hematology

## 2021-11-04 LAB — MICROALBUMIN / CREATININE URINE RATIO: Microalb Creat Ratio: 369

## 2021-11-05 ENCOUNTER — Other Ambulatory Visit: Payer: Self-pay

## 2021-11-05 DIAGNOSIS — E119 Type 2 diabetes mellitus without complications: Secondary | ICD-10-CM

## 2021-11-05 MED ORDER — GLIMEPIRIDE 1 MG PO TABS: 1.0000 mg | ORAL_TABLET | Freq: Two times a day (BID) | ORAL | 0 refills | Status: DC

## 2021-11-14 ENCOUNTER — Telehealth: Payer: Self-pay

## 2021-11-14 DIAGNOSIS — Z79899 Other long term (current) drug therapy: Secondary | ICD-10-CM

## 2021-11-14 LAB — BASIC METABOLIC PANEL
BUN/Creatinine Ratio: 18 (ref 12–28)
BUN: 31 mg/dL — ABNORMAL HIGH (ref 8–27)
CO2: 23 mmol/L (ref 20–29)
Calcium: 9.3 mg/dL (ref 8.7–10.3)
Chloride: 105 mmol/L (ref 96–106)
Creatinine, Ser: 1.76 mg/dL — ABNORMAL HIGH (ref 0.57–1.00)
Glucose: 105 mg/dL — ABNORMAL HIGH (ref 70–99)
Potassium: 5.4 mmol/L — ABNORMAL HIGH (ref 3.5–5.2)
Sodium: 140 mmol/L (ref 134–144)
eGFR: 31 mL/min/{1.73_m2} — ABNORMAL LOW (ref >59)

## 2021-11-14 MED ORDER — CHLORTHALIDONE 50 MG PO TABS: 50.0000 mg | ORAL_TABLET | Freq: Every day | ORAL | 3 refills | Status: DC

## 2021-11-14 NOTE — Telephone Encounter (Signed)
Medication list updated-  and Chlorthalidone '50mg'$  once daily sent to patient preferred pharmacy.  ?

## 2021-11-14 NOTE — Addendum Note (Signed)
Addended by: Rexanne Mano B on: 11/14/2021 01:48 PM ? ? Modules accepted: Orders ? ?

## 2021-11-14 NOTE — Telephone Encounter (Signed)
Attempted to call patient using vietnamese interpreter number [phone], unable to reach. Left message via interpreter for patient to return call when able.  ? ?Will also send a MyChart message to patient as patients daughter is active on patients MyChart account.  ?

## 2021-11-14 NOTE — Telephone Encounter (Signed)
-----   Message from [name], MD sent at 11/14/2021 10:40 AM EDT ----- ?Regarding: Med change ?Hi Eliezer Lofts, ? ?Mrs. Keithley's K was high and Crt went up. Please change her spironolactone to chlorthalidone 50 mg daily. Thank you ! ? ?

## 2021-11-19 ENCOUNTER — Other Ambulatory Visit: Payer: Self-pay | Admitting: Family

## 2021-11-19 ENCOUNTER — Other Ambulatory Visit: Payer: Self-pay | Admitting: Hematology

## 2021-11-19 DIAGNOSIS — I10 Essential (primary) hypertension: Secondary | ICD-10-CM

## 2021-11-20 ENCOUNTER — Other Ambulatory Visit: Payer: Self-pay

## 2021-11-20 DIAGNOSIS — I10 Essential (primary) hypertension: Secondary | ICD-10-CM

## 2021-11-20 MED ORDER — LOSARTAN POTASSIUM 100 MG PO TABS: 100.0000 mg | ORAL_TABLET | Freq: Every day | ORAL | 0 refills | Status: DC

## 2021-11-20 MED ORDER — AMLODIPINE BESYLATE 10 MG PO TABS: 10.0000 mg | ORAL_TABLET | Freq: Every day | ORAL | 0 refills | Status: DC

## 2021-11-28 ENCOUNTER — Other Ambulatory Visit: Payer: Self-pay

## 2021-11-28 DIAGNOSIS — C9000 Multiple myeloma not having achieved remission: Secondary | ICD-10-CM

## 2021-11-29 ENCOUNTER — Inpatient Hospital Stay: Payer: Medicaid Other | Attending: Hematology

## 2021-11-29 ENCOUNTER — Other Ambulatory Visit: Payer: Self-pay

## 2021-11-29 DIAGNOSIS — D472 Monoclonal gammopathy: Secondary | ICD-10-CM | POA: Insufficient documentation

## 2021-11-29 DIAGNOSIS — C9000 Multiple myeloma not having achieved remission: Secondary | ICD-10-CM

## 2021-11-29 LAB — CMP (CANCER CENTER ONLY)
ALT: 24 U/L (ref 0–44)
AST: 16 U/L (ref 15–41)
Albumin: 4 g/dL (ref 3.5–5.0)
Alkaline Phosphatase: 37 U/L — ABNORMAL LOW (ref 38–126)
Anion gap: 5 (ref 5–15)
BUN: 35 mg/dL — ABNORMAL HIGH (ref 8–23)
CO2: 24 mmol/L (ref 22–32)
Calcium: 9.1 mg/dL (ref 8.9–10.3)
Chloride: 108 mmol/L (ref 98–111)
Creatinine: 1.52 mg/dL — ABNORMAL HIGH (ref 0.44–1.00)
GFR, Estimated: 37 mL/min — ABNORMAL LOW (ref >60)
Glucose, Bld: 242 mg/dL — ABNORMAL HIGH (ref 70–99)
Potassium: 4.6 mmol/L (ref 3.5–5.1)
Sodium: 137 mmol/L (ref 135–145)
Total Bilirubin: 0.4 mg/dL (ref 0.3–1.2)
Total Protein: 8.2 g/dL — ABNORMAL HIGH (ref 6.5–8.1)

## 2021-11-29 LAB — CBC WITH DIFFERENTIAL (CANCER CENTER ONLY)
Abs Immature Granulocytes: 0.01 10*3/uL (ref 0.00–0.07)
Basophils Absolute: 0 10*3/uL (ref 0.0–0.1)
Basophils Relative: 1 %
Eosinophils Absolute: 0.2 10*3/uL (ref 0.0–0.5)
Eosinophils Relative: 4 %
HCT: 27.8 % — ABNORMAL LOW (ref 36.0–46.0)
Hemoglobin: 8.9 g/dL — ABNORMAL LOW (ref 12.0–15.0)
Immature Granulocytes: 0 %
Lymphocytes Relative: 31 %
Lymphs Abs: 1.7 10*3/uL (ref 0.7–4.0)
MCH: 26.3 pg (ref 26.0–34.0)
MCHC: 32 g/dL (ref 30.0–36.0)
MCV: 82 fL (ref 80.0–100.0)
Monocytes Absolute: 0.5 10*3/uL (ref 0.1–1.0)
Monocytes Relative: 8 %
Neutro Abs: 3 10*3/uL (ref 1.7–7.7)
Neutrophils Relative %: 56 %
Platelet Count: 214 10*3/uL (ref 150–400)
RBC: 3.39 MIL/uL — ABNORMAL LOW (ref 3.87–5.11)
RDW: 14.2 % (ref 11.5–15.5)
WBC Count: 5.5 10*3/uL (ref 4.0–10.5)
nRBC: 0 % (ref 0.0–0.2)

## 2021-11-29 LAB — IRON AND IRON BINDING CAPACITY (CC-WL,HP ONLY)
Iron: 92 ug/dL (ref 28–170)
Saturation Ratios: 29 % (ref 10.4–31.8)
TIBC: 316 ug/dL (ref 250–450)
UIBC: 224 ug/dL (ref 148–442)

## 2021-12-02 LAB — MULTIPLE MYELOMA PANEL, SERUM
Albumin SerPl Elph-Mcnc: 3.4 g/dL (ref 2.9–4.4)
Albumin/Glob SerPl: 0.9 (ref 0.7–1.7)
Alpha 1: 0.2 g/dL (ref 0.0–0.4)
Alpha2 Glob SerPl Elph-Mcnc: 0.8 g/dL (ref 0.4–1.0)
B-Globulin SerPl Elph-Mcnc: 0.9 g/dL (ref 0.7–1.3)
Gamma Glob SerPl Elph-Mcnc: 2.2 g/dL — ABNORMAL HIGH (ref 0.4–1.8)
Globulin, Total: 4.1 g/dL — ABNORMAL HIGH (ref 2.2–3.9)
IgA: 171 mg/dL (ref 87–352)
IgG (Immunoglobin G), Serum: 2113 mg/dL — ABNORMAL HIGH (ref 586–1602)
IgM (Immunoglobulin M), Srm: 70 mg/dL (ref 26–217)
M Protein SerPl Elph-Mcnc: 1.3 g/dL — ABNORMAL HIGH
Total Protein ELP: 7.5 g/dL (ref 6.0–8.5)

## 2021-12-02 LAB — KAPPA/LAMBDA LIGHT CHAINS
Kappa free light chain: 60.5 mg/L — ABNORMAL HIGH (ref 3.3–19.4)
Kappa, lambda light chain ratio: 2.49 — ABNORMAL HIGH (ref 0.26–1.65)
Lambda free light chains: 24.3 mg/L (ref 5.7–26.3)

## 2021-12-06 ENCOUNTER — Other Ambulatory Visit: Payer: Self-pay | Admitting: Hematology

## 2021-12-06 ENCOUNTER — Inpatient Hospital Stay: Payer: Medicaid Other | Admitting: Hematology

## 2021-12-12 ENCOUNTER — Ambulatory Visit (INDEPENDENT_AMBULATORY_CARE_PROVIDER_SITE_OTHER): Payer: Medicaid Other | Admitting: Endocrinology

## 2021-12-12 VITALS — BP 130/86 | HR 72 | Ht 63.0 in | Wt 159.4 lb

## 2021-12-12 DIAGNOSIS — N1832 Chronic kidney disease, stage 3b: Secondary | ICD-10-CM

## 2021-12-12 DIAGNOSIS — E1122 Type 2 diabetes mellitus with diabetic chronic kidney disease: Secondary | ICD-10-CM

## 2021-12-12 LAB — POCT GLYCOSYLATED HEMOGLOBIN (HGB A1C): Hemoglobin A1C: 7.3 % — AB (ref 4.0–5.6)

## 2021-12-12 MED ORDER — METFORMIN HCL ER 500 MG PO TB24: 2000.0000 mg | ORAL_TABLET | Freq: Every day | ORAL | 3 refills | Status: DC

## 2021-12-12 NOTE — Progress Notes (Signed)
? ?Subjective:  ? ? Patient ID: Jennifer Molina, female    DOB: September 26, 1955, 66 y.o.   MRN: [id number] ? ?HPI ?Dtr translates ?Pt returns for f/u of diabetes mellitus: ?DM type: 2 ?Dx'ed: 2019 ?Complications: CRI and PN ?Therapy: 2 oral meds ?GDM: never ?DKA: never ?Severe hypoglycemia: never ?Pancreatitis: never ?Pancreatic imaging: none known ?SDOH: none ?Other: she took insulin for just a few mos in 2020 ?Interval history: Pt has cbg meter, but she does not use.  She takes meds as rx'ed ?Past Medical History:  ?Diagnosis Date  ? Dementia ([place]) 03/28/2019  ? DM (diabetes mellitus), type 2, uncontrolled 2015  ? Has poor vision.  Family not aware of A1C or what sugars run.  Believe not well controlled.  Not able to monitor due to cost of monitoring equipment  ? Hypertension 2015  ? ? ?No past surgical history on file. ? ?Social History  ? ?Socioeconomic History  ? Marital status: Married  ?  Spouse name: [name]  ? Number of children: 7  ? Years of education: 3  ? Highest education level: 3rd grade  ?Occupational History  ? 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 Smoking status: Never  ? Smokeless tobacco: Never  ?Vaping Use  ? Vaping Use: Never used  ?Substance and Sexual Activity  ? Alcohol use: Never  ? Drug use: Never  ? Sexual activity: Not on file  ?Other Topics Concern  ? Not on file  ?Social History Narrative  ? [place] in [place]  ? Lives at home with husband  ? Her 2 daughters, 2 sons and their families  ? All together, 11 people in the home  ? ?Social Determinants of Health  ? ?Financial Resource Strain: Not on file  ?Food Insecurity: Not on file  ?Transportation Needs: Not on file  ?Physical Activity: Not on file  ?Stress: Not on file  ?Social Connections: Not on file  ?Intimate Partner Violence: Not on file  ? ? ?Current Outpatient Medications on File Prior to Visit  ?Medication Sig Dispense Refill  ? acetaminophen (TYLENOL) 500 MG tablet Take 1,000 mg by mouth every 6 (six) hours as  needed for mild pain or fever.    ? amLODipine (NORVASC) 10 MG tablet Take 1 tablet (10 mg total) by mouth daily. 60 tablet 0  ? ASPIRIN LOW DOSE 81 MG EC tablet TAKE 1 TABLET(81 MG) BY MOUTH DAILY 30 tablet 2  ? atorvastatin (LIPITOR) 40 MG tablet Take 1 tablet (40 mg total) by mouth daily. 90 tablet 1  ? Blood Glucose Monitoring Suppl (TRUE METRIX METER) w/Device KIT Use as directed 1 kit 0  ? chlorthalidone (HYGROTON) 50 MG tablet Take 1 tablet (50 mg total) by mouth daily. 90 tablet 3  ? dapagliflozin propanediol (FARXIGA) 5 MG TABS tablet Take 1 tablet (5 mg total) by mouth daily before breakfast. 90 tablet 3  ? FEROSUL 325 (65 Fe) MG tablet TAKE 1 TABLET(325 MG) BY MOUTH DAILY 30 tablet 5  ? gabapentin (NEURONTIN) 300 MG capsule Take 1 capsule (300 mg total) by mouth at bedtime. 30 capsule 2  ? glimepiride (AMARYL) 1 MG tablet Take 1 tablet (1 mg total) by mouth 2 (two) times daily with a meal. 180 tablet 0  ? glucose blood (ACCU-CHEK GUIDE) test strip 1 each by Other route daily. And lancets 1/day 100 each 3  ? glucose blood (TRUE METRIX BLOOD GLUCOSE TEST) test strip Use as instructed 100 each 12  ?  losartan (COZAAR) 100 MG tablet Take 1 tablet (100 mg total) by mouth daily. 60 tablet 0  ? TRUEplus Lancets 28G MISC Use as directed 100 each 4  ? metoprolol tartrate (LOPRESSOR) 100 MG tablet Take 1 tablet (100 mg total) by mouth 2 (two) times daily. 120 tablet 0  ? ?No current facility-administered medications on file prior to visit.  ? ? ?No Known Allergies ? ?Family History  ?Problem Relation Age of Onset  ? Diabetes Mother   ? Diabetes Mellitus II Neg Hx   ? ? ?BP 130/86 (BP Location: Left Arm, Patient Position: Sitting, Cuff Size: Normal)   Pulse 72   Ht _0  (1.6 m)   Wt 159 lb 6.4 oz (72.3 kg)   LMP  (LMP Unknown)   SpO2 95%   BMI 28.24 kg/m?  ? ? ?Review of Systems ?Denies N/V/D ?   ?Objective:  ? Physical Exam ?VITAL SIGNS:  See vs page ?GENERAL: no distress ? ? ? ?A1c=7.3% ?   ?Assessment &  Plan:  ?We'll first change the metformin to -XR, in preparation for GLP rx.   ?Noncompliance with cbg monitoring.  I advised pt to just check TIW, varying the time of day.   ? ?Patient Instructions  ?check your blood sugar once a day.  vary the time of day when you check, between before the 3 meals, and at bedtime.  also check if you have symptoms of your blood sugar being too high or too low.  please keep a record of the readings and bring it to your next appointment here (or you can bring the meter itself).  You can write it on any piece of paper.  please call us sooner if your blood sugar goes below 70, or if most of your readings are over 200.   ?I have sent a prescription to your pharmacy, to change metformin to extended-release.   ?Please continue the same other medications.   ?You should have an endocrinology follow-up appointment in 2 months.   ? ? ?ki?m tra l??ng ???ng trong m?u c?a b?n m?i ng?y m?t l?n. thay ??i th?i gian trong ng?y khi b?n ki?m tra, gi?a tr??c 3 b?a ?n v? tr??c khi ?i ng?. ??ng th?i ki?m tra xem b?n c? c?c tri?u ch?ng c?a l??ng ???ng trong m?u qu? cao ho?c qu? th?p hay kh?ng. vui l?ng ghi l?i c?c l?n ??c v? mang ??n cu?c h?n ti?p theo c?a b?n t?i ??y (ho?c b?n c? th? t? mang theo m?y ?o). B?n c? th? vi?t n? tr?n b?t k? m?nh gi?y n?o. vui l?ng g?i cho ch?ng t?i s?m h?n n?u l??ng ???ng trong m?u c?a b?n xu?ng d??i 70 ho?c n?u h?u h?t c?c ch? s? c?a b?n ??u tr?n 200. ?T?i ?? g?i ??n thu?c ??n hi?u thu?c c?a b?n ?? ??i metformin th?nh d?ng ph?ng th?ch k?o d?i.   ?H?y ti?p t?c c?c lo?i thu?c kh?c. ?B?n n?n t?i kh?m chuy?n khoa n?i ti?t sau 2 th?ng.   ? ? ? ? ?

## 2021-12-12 NOTE — Patient Instructions (Addendum)
check your blood sugar once a day.  vary the time of day when you check, between before the 3 meals, and at bedtime.  also check if you have symptoms of your blood sugar being too high or too low.  please keep a record of the readings and bring it to your next appointment here (or you can bring the meter itself).  You can write it on any piece of paper.  please call us sooner if your blood sugar goes below 70, or if most of your readings are over 200.   ?I have sent a prescription to your pharmacy, to change metformin to extended-release.   ?Please continue the same other medications.   ?You should have an endocrinology follow-up appointment in 2 months.   ? ? ?ki?m tra l??ng ???ng trong m?u c?a b?n m?i ng?y m?t l?n. thay ??i th?i gian trong ng?y khi b?n ki?m tra, gi?a tr??c 3 b?a ?n v? tr??c khi ?i ng?. ??ng th?i ki?m tra xem b?n c? c?c tri?u ch?ng c?a l??ng ???ng trong m?u qu? cao ho?c qu? th?p hay kh?ng. vui l?ng ghi l?i c?c l?n ??c v? mang ??n cu?c h?n ti?p theo c?a b?n t?i ??y (ho?c b?n c? th? t? mang theo m?y ?o). B?n c? th? vi?t n? tr?n b?t k? m?nh gi?y n?o. vui l?ng g?i cho ch?ng t?i s?m h?n n?u l??ng ???ng trong m?u c?a b?n xu?ng d??i 70 ho?c n?u h?u h?t c?c ch? s? c?a b?n ??u tr?n 200. ?T?i ?? g?i ??n thu?c ??n hi?u thu?c c?a b?n ?? ??i metformin th?nh d?ng ph?ng th?ch k?o d?i.   ?H?y ti?p t?c c?c lo?i thu?c kh?c. ?B?n n?n t?i kh?m chuy?n khoa n?i ti?t sau 2 th?ng.   ? ? ? ?

## 2021-12-17 ENCOUNTER — Inpatient Hospital Stay: Payer: Medicaid Other | Attending: Hematology | Admitting: Hematology

## 2021-12-17 ENCOUNTER — Other Ambulatory Visit: Payer: Self-pay

## 2021-12-17 VITALS — BP 154/62 | HR 70 | Temp 97.5°F | Resp 16 | Wt 158.7 lb

## 2021-12-17 DIAGNOSIS — E1122 Type 2 diabetes mellitus with diabetic chronic kidney disease: Secondary | ICD-10-CM | POA: Insufficient documentation

## 2021-12-17 DIAGNOSIS — I129 Hypertensive chronic kidney disease with stage 1 through stage 4 chronic kidney disease, or unspecified chronic kidney disease: Secondary | ICD-10-CM | POA: Insufficient documentation

## 2021-12-17 DIAGNOSIS — D472 Monoclonal gammopathy: Secondary | ICD-10-CM | POA: Insufficient documentation

## 2021-12-17 DIAGNOSIS — D649 Anemia, unspecified: Secondary | ICD-10-CM | POA: Diagnosis not present

## 2021-12-17 DIAGNOSIS — N189 Chronic kidney disease, unspecified: Secondary | ICD-10-CM | POA: Diagnosis not present

## 2021-12-17 NOTE — Progress Notes (Signed)
? ? ?HEMATOLOGY/ONCOLOGY CLINIC NOTE ? ?Date of Service: .12/17/2021 ? ? ?Patient Care Team: ?[name], NP as PCP - General (Nurse Practitioner) ?[name], MD as PCP - Cardiology (Cardiology) ?[name], MD (Nephrology) ?[name], MD as Consulting Physician (Hematology) ?Dr [name] Nephrology ?[name] MD - cards ?Dr [name]- Endocrinology ? 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 ?HISTORY OF PRESENTING ILLNESS:  ?Please see previous notes for details of initial presentation ? ?Interval History ? 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  She is accompanied by an interpreter and her daughter . ?Her blood pressure notes that she has been keeping her appointments with her primary care physician and all of the other specialists.  Her cardiologist is managing her hypertension and her endocrinologist has been primarily managing her diabetes. ?Patient's daughter notes that the hypertension and diabetes are still not well controlled. ?Patient's daughter reports that the patient had retinal hemorrhages which were thought to be related to her diabetes.  She is getting Avastin injections for this.  She was also noted to have cataracts. ? ?Patient notes some fatigue which appears multifactorial.  No other acute new focal symptoms.  No new bone pains .  No fevers no chills no night sweats. ?Labs done on 11/29/2021 were discussed in detail. ? ?MEDICAL HISTORY:  ?Past Medical History:  ?Diagnosis Date  ? Dementia ([place]) 03/28/2019  ? DM (diabetes mellitus), type 2, uncontrolled 2015  ? Has poor vision.  Family not aware of A1C or what sugars run.  Believe not well controlled.  Not able to monitor due to cost of monitoring equipment  ? Hypertension 2015  ? ? ?SURGICAL HISTORY: ?No past surgical history on file. ? ?SOCIAL HISTORY: ?Social History  ? ?Socioeconomic History  ? Marital status: Married  ?   Spouse name: [name]  ? Number of children: 7  ? Years of education: 3  ? Highest education level: 3rd grade  ?Occupational History  ? 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 Smoking status: Never  ? Smokeless tobacco: Never  ?Vaping Use  ? Vaping Use: Never used  ?Substance and Sexual Activity  ? Alcohol use: Never  ? Drug use: Never  ? Sexual activity: Not on file  ?Other Topics Concern  ? Not on file  ?Social History Narrative  ? Came to [place] in [place]  ? Lives at home with husband  ? Her 2 daughters, 2 sons and their families  ? All together, 11 people in the home  ? ?Social Determinants of Health  ? ?Financial Resource Strain: Not on file  ?Food Insecurity: Not on file  ?Transportation Needs: Not on file  ?Physical Activity: Not on file  ?Stress: Not on file  ?Social Connections: Not on file  ?Intimate Partner Violence: Not on file  ? ? ?FAMILY HISTORY: ?Family History  ?Problem Relation Age of Onset  ? Diabetes Mother   ? Diabetes Mellitus II Neg Hx   ? ? ?ALLERGIES:  has No Known Allergies. ? ?MEDICATIONS:  ?Current Outpatient Medications  ?Medication Sig Dispense Refill  ? acetaminophen (TYLENOL) 500 MG tablet Take 1,000 mg by mouth every 6 (six) hours as needed for mild pain or fever.    ? amLODipine (NORVASC) 10 MG tablet Take 1 tablet (10 mg total) by mouth daily. 60 tablet 0  ? ASPIRIN LOW DOSE 81 MG EC tablet TAKE 1 TABLET(81  MG) BY MOUTH DAILY 30 tablet 2  ? atorvastatin (LIPITOR) 40 MG tablet Take 1 tablet (40 mg total) by mouth daily. 90 tablet 1  ? Blood Glucose Monitoring Suppl (TRUE METRIX METER) w/Device KIT Use as directed 1 kit 0  ? chlorthalidone (HYGROTON) 50 MG tablet Take 1 tablet (50 mg total) by mouth daily. 90 tablet 3  ? dapagliflozin propanediol (FARXIGA) 5 MG TABS tablet Take 1 tablet (5 mg total) by mouth daily before breakfast. 90 tablet 3  ? FEROSUL 325 (65 Fe) MG tablet TAKE 1 TABLET(325 MG) BY MOUTH DAILY 30 tablet 5  ? gabapentin (NEURONTIN) 300 MG capsule  Take 1 capsule (300 mg total) by mouth at bedtime. 30 capsule 2  ? glimepiride (AMARYL) 1 MG tablet Take 1 tablet (1 mg total) by mouth 2 (two) times daily with a meal. 180 tablet 0  ? glucose blood (ACCU-CHEK GUIDE) test strip 1 each by Other route daily. And lancets 1/day 100 each 3  ? glucose blood (TRUE METRIX BLOOD GLUCOSE TEST) test strip Use as instructed 100 each 12  ? losartan (COZAAR) 100 MG tablet Take 1 tablet (100 mg total) by mouth daily. 60 tablet 0  ? metFORMIN (GLUCOPHAGE-XR) 500 MG 24 hr tablet Take 4 tablets (2,000 mg total) by mouth daily. 360 tablet 3  ? metoprolol tartrate (LOPRESSOR) 100 MG tablet Take 1 tablet (100 mg total) by mouth 2 (two) times daily. 120 tablet 0  ? TRUEplus Lancets 28G MISC Use as directed 100 each 4  ? ?No current facility-administered medications for this visit.  ? ? ?REVIEW OF SYSTEMS:   ?10 Point review of Systems was done is negative except as noted above. ? ?PHYSICAL EXAMINATION: ?ECOG PERFORMANCE STATUS: 1 - Symptomatic but completely ambulatory ? ?. ?Vitals:  ? 12/17/21 0934  ?BP: (!) 154/62  ?Pulse: 70  ?Resp: 16  ?Temp: (!) 97.5 ?F (36.4 ?C)  ?SpO2: 98%  ? ? ?Filed Weights  ? 12/17/21 0934  ?Weight: 158 lb 11.2 oz (72 kg)  ? ? ?.Body mass index is 28.11 kg/m?. ?. ?GENERAL:alert, in no acute distress and comfortable ?SKIN: no acute rashes, no significant lesions ?EYES: conjunctiva are pink and non-injected, sclera anicteric ?OROPHARYNX: MMM, no exudates, no oropharyngeal erythema or ulceration ?NECK: supple, no JVD ?LYMPH:  no palpable lymphadenopathy in the cervical, axillary or inguinal regions ?LUNGS: clear to auscultation b/l with normal respiratory effort ?HEART: regular rate & rhythm ?ABDOMEN:  normoactive bowel sounds , non tender, not distended. ?Extremity: no pedal edema ?PSYCH: alert & oriented x 3 with fluent speech ?NEURO: no focal motor/sensory deficits ? ?LABORATORY DATA:  ?I have reviewed the data as listed ? ?. ? ?  Latest Ref Rng & Units  11/29/2021  ?  9:08 AM 08/30/2021  ? 10:10 AM 08/27/2021  ?  9:37 AM  ?CBC  ?WBC 4.0 - 10.5 K/uL 5.5   7.0   6.5    ?Hemoglobin 12.0 - 15.0 g/dL 8.9   9.1   8.4    ?Hematocrit 36.0 - 46.0 % 27.8   28.2   26.1    ?Platelets 150 - 400 K/uL 214   264   259    ? ?CBC ?   ?Component Value Date/Time  ? WBC 5.5 11/29/2021 0908  ? WBC 7.8 10/02/2020 1504  ? RBC 3.39 (L) 11/29/2021 0908  ? HGB 8.9 (L) 11/29/2021 0908  ? HGB 12.2 03/28/2019 1143  ? HCT 27.8 (L) 11/29/2021 0908  ? HCT 37.4 03/28/2019  1143  ? PLT 214 11/29/2021 0908  ? PLT 295 03/28/2019 1143  ? MCV 82.0 11/29/2021 0908  ? MCV 83 03/28/2019 1143  ? MCH 26.3 11/29/2021 0908  ? MCHC 32.0 11/29/2021 0908  ? RDW 14.2 11/29/2021 0908  ? RDW 14.4 03/28/2019 1143  ? LYMPHSABS 1.7 11/29/2021 0908  ? LYMPHSABS 2.5 03/28/2019 1143  ? MONOABS 0.5 11/29/2021 0908  ? EOSABS 0.2 11/29/2021 0908  ? EOSABS 0.1 03/28/2019 1143  ? BASOSABS 0.0 11/29/2021 0908  ? BASOSABS 0.1 03/28/2019 1143  ? ? ? ?  Latest Ref Rng & Units 11/29/2021  ?  9:08 AM 11/13/2021  ?  3:21 PM 08/27/2021  ?  9:37 AM  ?CMP  ?Glucose 70 - 99 mg/dL 242   105   238    ?BUN 8 - 23 mg/dL 35   31   25    ?Creatinine 0.44 - 1.00 mg/dL 1.52   1.76   1.27    ?Sodium 135 - 145 mmol/L 137   140   138    ?Potassium 3.5 - 5.1 mmol/L 4.6   5.4   4.2    ?Chloride 98 - 111 mmol/L 108   105   107    ?CO2 22 - 32 mmol/L 24   23   26     ?Calcium 8.9 - 10.3 mg/dL 9.1   9.3   8.9    ?Total Protein 6.5 - 8.1 g/dL 8.2    7.6    ?Total Bilirubin 0.3 - 1.2 mg/dL 0.4    0.4    ?Alkaline Phos 38 - 126 U/L 37    39    ?AST 15 - 41 U/L 16    12    ?ALT 0 - 44 U/L 24    18    ? ?. ?Lab Results  ?Component Value Date  ? IRON 92 11/29/2021  ? TIBC 316 11/29/2021  ? IRONPCTSAT 29 11/29/2021  ? ?(Iron and TIBC) ? ?Lab Results  ?Component Value Date  ? FERRITIN 489 (H) 08/30/2021  ? ? ? ?09/24/2020 Surgical Pathology ?DIAGNOSIS:  ? ?BONE MARROW, ASPIRATE, CLOT, CORE:  ?-Slightly hypercellular bone marrow for age with plasma cell neoplasm  ?-See  comment  ? ?PERIPHERAL BLOOD:  ?-Normocytic-normochromic anemia  ? ?COMMENT:  ? ?The bone marrow is hypercellular for age with slight increase in plasma  ?cells representing 8% of all cells in the aspirate w

## 2022-01-21 ENCOUNTER — Other Ambulatory Visit: Payer: Self-pay | Admitting: Family

## 2022-01-21 DIAGNOSIS — E785 Hyperlipidemia, unspecified: Secondary | ICD-10-CM

## 2022-01-21 NOTE — Telephone Encounter (Signed)
Refilled per patient request. 

## 2022-01-25 NOTE — Progress Notes (Unsigned)
01/27/2022 Jennifer Molina 23-Aug-1955 [id number]   HPI:  Jennifer Molina is a 66 y.o. female patient of Dr Harl Bowie, with a PMH below who presents today for hypertension clinic evaluation.    She was seen by Dr. Harl Bowie in March and found to have a BP of 188/100.  Spironolactone was increased from 12.5 to 50 mg. Unfortunately this caused her potassium to jump to 5.4.  This was stopped and instead she was given chlorthalidone 50 mg.    She returns today for follow up.  She is in the office with her daughter who is serving as Astronomer.  Patient has developed some dementia, and her daughter takes care of her medications (7 day pill minders) as well as answers most of the questions in the office.  Her only complaint today is that she feels tired easily.    Past Medical History: hyperlipidemia On atorvastatin  DM2 4/23 A1c 7.3 - on dapagliflozin, glimeperide, metformin; doesn't like to check BS   dementia Daughter answers most questions    Blood Pressure Goal:  130/80  Current Medications: chlorthalidone 50 mg qd, amlodipine 10 mg qd, losartan 100 mg qd, metoprolol tart 100 mg bid  Family Hx:  not much known about parents or siblings - 1 son with hypertension, another son died from MI at 56  Social Hx: no tobacco, no alcohol no regular caffeine  Diet: uses salt regularly, does eat lots of vegetables, not much meat but mostly pork  Exercise: none  Home BP readings: she only looks at systolic readings, by memory range is 130-153  Intolerances: nkda  Labs: 4/23: Na 137, K 4.6, Glu 242, BUN 35, SCr 1.52, GFR 37   Wt Readings from Last 3 Encounters:  12/17/21 158 lb 11.2 oz (72 kg)  12/12/21 159 lb 6.4 oz (72.3 kg)  10/25/21 156 lb 9.6 oz (71 kg)   BP Readings from Last 3 Encounters:  01/27/22 (!) 168/81  12/17/21 (!) 154/62  12/12/21 130/86   Pulse Readings from Last 3 Encounters:  01/27/22 74  12/17/21 70  12/12/21 72    Current Outpatient Medications  Medication Sig Dispense  Refill   amLODipine (NORVASC) 10 MG tablet Take 1 tablet (10 mg total) by mouth daily. 60 tablet 0   ASPIRIN LOW DOSE 81 MG EC tablet TAKE 1 TABLET(81 MG) BY MOUTH DAILY 30 tablet 2   atorvastatin (LIPITOR) 40 MG tablet Take 1 tablet by mouth once daily 90 tablet 0   Blood Glucose Monitoring Suppl (TRUE METRIX METER) w/Device KIT Use as directed 1 kit 0   chlorthalidone (HYGROTON) 50 MG tablet Take 1 tablet (50 mg total) by mouth daily. 90 tablet 3   dapagliflozin propanediol (FARXIGA) 5 MG TABS tablet Take 1 tablet (5 mg total) by mouth daily before breakfast. 90 tablet 3   FEROSUL 325 (65 Fe) MG tablet TAKE 1 TABLET(325 MG) BY MOUTH DAILY 30 tablet 5   gabapentin (NEURONTIN) 300 MG capsule Take 1 capsule (300 mg total) by mouth at bedtime. 30 capsule 2   glimepiride (AMARYL) 1 MG tablet Take 1 tablet (1 mg total) by mouth 2 (two) times daily with a meal. 180 tablet 0   glucose blood (ACCU-CHEK GUIDE) test strip 1 each by Other route daily. And lancets 1/day 100 each 3   glucose blood (TRUE METRIX BLOOD GLUCOSE TEST) test strip Use as instructed 100 each 12   metFORMIN (GLUCOPHAGE-XR) 500 MG 24 hr tablet Take 4 tablets (2,000 mg total) by  mouth daily. 360 tablet 3   metoprolol tartrate (LOPRESSOR) 100 MG tablet Take 1 tablet (100 mg total) by mouth 2 (two) times daily. 120 tablet 0   olmesartan (BENICAR) 40 MG tablet Take 1 tablet (40 mg total) by mouth daily. 90 tablet 3   TRUEplus Lancets 28G MISC Use as directed 100 each 4   acetaminophen (TYLENOL) 500 MG tablet Take 1,000 mg by mouth every 6 (six) hours as needed for mild pain or fever.     No current facility-administered medications for this visit.    No Known Allergies  Past Medical History:  Diagnosis Date   Dementia ([place]) 03/28/2019   DM (diabetes mellitus), type 2, uncontrolled 2015   Has poor vision.  Family not aware of A1C or what sugars run.  Believe not well controlled.  Not able to monitor due to cost of monitoring  equipment   Hypertension 2015    Blood pressure (!) 168/81, pulse 74.  Hypertension Patient with resistant hypertension, currently on 4 medications.  To avoid adding more medications, will switch losartan to olmesartan 40 mg once daily.  Also will have her move amlodipine to evenings for better balance.  She should continue to check home BP readings at least 3 times each week and was given a log sheet to record this.  She will return in 6 weeks for follow up, at which time she was asked to bring her home cuff.     Tommy Medal PharmD CPP [facility] [address] [place], [place] [address] [phone]

## 2022-01-27 ENCOUNTER — Encounter: Payer: Self-pay | Admitting: Pharmacist Clinician (PhC)/ Clinical Pharmacy Specialist

## 2022-01-27 ENCOUNTER — Ambulatory Visit (INDEPENDENT_AMBULATORY_CARE_PROVIDER_SITE_OTHER): Payer: Medicaid Other | Admitting: Pharmacist Clinician (PhC)/ Clinical Pharmacy Specialist

## 2022-01-27 DIAGNOSIS — I10 Essential (primary) hypertension: Secondary | ICD-10-CM | POA: Diagnosis not present

## 2022-01-27 MED ORDER — OLMESARTAN MEDOXOMIL 40 MG PO TABS: 40.0000 mg | ORAL_TABLET | Freq: Every day | ORAL | 3 refills | Status: DC

## 2022-01-27 NOTE — Patient Instructions (Signed)
Return for a a follow up appointment July 25 at 9 am  Check your blood pressure at home three times each week and keep record of the readings.  Take your BP meds as follows:  Stop losartan.  Start olmesartan 40 mg once daily in the mornings.   Switch amlodipine to evenings.   Continue with all other medications.   Bring all of your meds, your BP cuff and your record of home blood pressures to your next appointment.  Exercise as you're able, try to walk approximately 30 minutes per day.  Keep salt intake to a minimum, especially watch canned and prepared boxed foods.  Eat more fresh fruits and vegetables and fewer canned items.  Avoid eating in fast food restaurants.    HOW TO TAKE YOUR BLOOD PRESSURE: Rest 5 minutes before taking your blood pressure.  Don't smoke or drink caffeinated beverages for at least 30 minutes before. Take your blood pressure before (not after) you eat. Sit comfortably with your back supported and both feet on the floor (don't cross your legs). Elevate your arm to heart level on a table or a desk. Use the proper sized cuff. It should fit smoothly and snugly around your bare upper arm. There should be enough room to slip a fingertip under the cuff. The bottom edge of the cuff should be 1 inch above the crease of the elbow. Ideally, take 3 measurements at one sitting and record the average.

## 2022-01-27 NOTE — Assessment & Plan Note (Signed)
Patient with resistant hypertension, currently on 4 medications.  To avoid adding more medications, will switch losartan to olmesartan 40 mg once daily.  Also will have her move amlodipine to evenings for better balance.  She should continue to check home BP readings at least 3 times each week and was given a log sheet to record this.  She will return in 6 weeks for follow up, at which time she was asked to bring her home cuff.

## 2022-02-05 ENCOUNTER — Other Ambulatory Visit: Payer: Self-pay | Admitting: Hematology

## 2022-02-12 ENCOUNTER — Other Ambulatory Visit: Payer: Self-pay | Admitting: Family

## 2022-02-12 DIAGNOSIS — E119 Type 2 diabetes mellitus without complications: Secondary | ICD-10-CM

## 2022-02-19 ENCOUNTER — Ambulatory Visit (INDEPENDENT_AMBULATORY_CARE_PROVIDER_SITE_OTHER): Payer: Medicaid Other | Admitting: Endocrinology

## 2022-02-19 ENCOUNTER — Encounter: Payer: Self-pay | Admitting: Endocrinology

## 2022-02-19 VITALS — BP 144/82 | HR 68 | Ht 63.0 in | Wt 157.8 lb

## 2022-02-19 DIAGNOSIS — E782 Mixed hyperlipidemia: Secondary | ICD-10-CM

## 2022-02-19 DIAGNOSIS — E1165 Type 2 diabetes mellitus with hyperglycemia: Secondary | ICD-10-CM

## 2022-02-19 DIAGNOSIS — E1121 Type 2 diabetes mellitus with diabetic nephropathy: Secondary | ICD-10-CM | POA: Diagnosis not present

## 2022-02-19 DIAGNOSIS — R630 Anorexia: Secondary | ICD-10-CM | POA: Diagnosis not present

## 2022-02-19 LAB — POCT GLUCOSE (DEVICE FOR HOME USE): POC Glucose: 155 mg/dl — AB (ref 70–99)

## 2022-02-19 MED ORDER — DAPAGLIFLOZIN PROPANEDIOL 10 MG PO TABS: 10.0000 mg | ORAL_TABLET | Freq: Every day | ORAL | 3 refills | Status: DC

## 2022-02-19 NOTE — Progress Notes (Signed)
Patient ID: Jennifer Molina, female   DOB: 06/06/1956, 66 y.o.   MRN: [id number]           Reason for Appointment: Type II Diabetes follow-up   History of Present Illness   Diagnosis date: 2019  Previous history:  Non-insulin hypoglycemic drugs previously used: Metformin, Amaryl Has not been on insulin except in the hospital in 2020  A1c range in the last few years is: 7.3-9.1  Recent history:     Non-insulin hypoglycemic drugs: Metformin 2g daily,   Farxiga 5 mg daily       Side effects from medications: None  Current self management, blood sugar patterns and problems identified:  A1c is most recently 7.3 Patient is unable to give a history and information was obtained from her daughter who is acting as interpreter She does not like to check her blood sugars as she does not like fingerprints and has not checked her sugar at all Although she was previously taking glimepiride previously her last 90-day prescription was filled in March, this has been previously prescribed by her PCP Farxiga was apparently started in 2/23 and she is continuing 5 mg daily, has not had any yeast infections or apparent side effects   Diet management: Generally eating 1 meal a day Today had rice and vegetables, sometimes will eat some meat, has no meal plan     Hypoglycemia:  none    Glucometer:       True Metrix  Blood Glucose readings n/a, not monitoring   Dietician visit: Most recent: Never     Weight control:  Wt Readings from Last 3 Encounters:  02/19/22 157 lb 12.8 oz (71.6 kg)  12/17/21 158 lb 11.2 oz (72 kg)  12/12/21 159 lb 6.4 oz (72.3 kg)            Diabetes labs:  Lab Results  Component Value Date   HGBA1C 7.3 (A) 12/12/2021   HGBA1C 8.0 (A) 10/09/2021   HGBA1C 9.1 (A) 07/23/2021   Lab Results  Component Value Date   LDLCALC 80 06/19/2021   CREATININE 1.52 (H) 11/29/2021   No results found for: "FRUCTOSAMINE"   Allergies as of 02/19/2022   No Known Allergies       Medication List        Accurate as of February 19, 2022 11:06 AM. If you have any questions, ask your nurse or doctor.          acetaminophen 500 MG tablet Commonly known as: TYLENOL Take 1,000 mg by mouth every 6 (six) hours as needed for mild pain or fever.   amLODipine 10 MG tablet Commonly known as: NORVASC Take 1 tablet (10 mg total) by mouth daily.   amLODipine-olmesartan 10-40 MG tablet Commonly known as: AZOR Take 1 tablet by mouth daily.   Aspirin Low Dose 81 MG tablet Generic drug: aspirin EC TAKE 1 TABLET(81 MG) BY MOUTH DAILY   atorvastatin 40 MG tablet Commonly known as: LIPITOR Take 1 tablet by mouth once daily   chlorthalidone 50 MG tablet Commonly known as: HYGROTON Take 1 tablet (50 mg total) by mouth daily.   dapagliflozin propanediol 5 MG Tabs tablet Commonly known as: Farxiga Take 1 tablet (5 mg total) by mouth daily before breakfast.   FeroSul 325 (65 FE) MG tablet Generic drug: ferrous sulfate TAKE 1 TABLET(325 MG) BY MOUTH DAILY   gabapentin 300 MG capsule Commonly known as: NEURONTIN Take 1 capsule (300 mg total) by mouth at bedtime.   glimepiride 1   MG tablet Commonly known as: AMARYL Take 1 tablet (1 mg total) by mouth 2 (two) times daily with a meal.   metFORMIN 500 MG 24 hr tablet Commonly known as: GLUCOPHAGE-XR Take 4 tablets (2,000 mg total) by mouth daily.   metoprolol tartrate 100 MG tablet Commonly known as: LOPRESSOR Take 1 tablet (100 mg total) by mouth 2 (two) times daily.   olmesartan 40 MG tablet Commonly known as: BENICAR Take 1 tablet (40 mg total) by mouth daily.   True Metrix Blood Glucose Test test strip Generic drug: glucose blood Use as instructed   Accu-Chek Guide test strip Generic drug: glucose blood 1 each by Other route daily. And lancets 1/day   True Metrix Meter w/Device Kit Use as directed   TRUEplus Lancets 28G Misc Use as directed        Allergies: No Known Allergies  Past Medical  History:  Diagnosis Date   Dementia ([facility]) 03/28/2019   DM (diabetes mellitus), type 2, uncontrolled 2015   Has poor vision.  Family not aware of A1C or what sugars run.  Believe not well controlled.  Not able to monitor due to cost of monitoring equipment   Hypertension 2015    No past surgical history on file.  Family History  Problem Relation Age of Onset   Diabetes Mother    Diabetes Mellitus II Neg Hx     Social History:  reports that she has never smoked. She has never used smokeless tobacco. She reports that she does not drink alcohol and does not use drugs.  Review of Systems:  Last diabetic eye exam date  Last foot exam date:  Symptoms of neuropathy: Has numbness in feet  Hypertension:   Treatment includes olmesartan 40 mg daily  BP Readings from Last 3 Encounters:  02/19/22 (!) 144/82  01/27/22 (!) 168/81  12/17/21 (!) 154/62   She has had variable renal function since 2021 with creatinine as high as 2 She is apparently seeing a nephrologist periodically  Lab Results  Component Value Date   CREATININE 1.52 (H) 11/29/2021   CREATININE 1.76 (H) 11/13/2021   CREATININE 1.27 (H) 08/27/2021     Lipid management: She has been treated with Lipitor 40 mg daily, prescribed by her PCP, previously was on Crestor    Lab Results  Component Value Date   CHOL 157 06/19/2021   CHOL 222 (H) 03/28/2019   Lab Results  Component Value Date   HDL 41 06/19/2021   HDL 40 03/28/2019   Lab Results  Component Value Date   LDLCALC 80 06/19/2021   LDLCALC 104 (H) 03/28/2019   Lab Results  Component Value Date   TRIG 218 (H) 06/19/2021   TRIG 389 (H) 03/28/2019   Lab Results  Component Value Date   CHOLHDL 3.8 06/19/2021   No results found for: "LDLDIRECT"  Complains of feeling tired, has had significant anemia followed by hematologist   Examination:   BP (!) 144/82   Pulse 68   Ht 5' 3" (1.6 m)   Wt 157 lb 12.8 oz (71.6 kg)   LMP  (LMP Unknown)   SpO2 99%    BMI 27.95 kg/m   Body mass index is 27.95 kg/m.   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, most recent GFR is 37  Recommendations:  Need more balanced diet with some protein at each meal Will refer her to the nutritionist for meal planning She needs to talk to her PCP regarding decreased appetite Since she has renal insufficiency we will reduce her metformin to 1000 mg total per day instead of 2000 She does need to more regular walking for exercise Also for better efficacy as well as increased benefits for CKD she will increase her FARXIGA to 10 mg daily She will have her lipids monitored from her PCP, tends to have high triglycerides She does not monitor her blood sugar at home but unclear whether she will allow her daughter to help her check this, will benefit from some postprandial monitoring  Decreased appetite: She will need to discuss her symptoms with her PCP  Follow-up in 3 months  There are no Patient Instructions on file for this visit.  Total visit time for evaluation and management of various problems, review of multiple issues in her electronic chart and counseling = 30 minutes  Mazell Aylesworth 02/19/2022, 11:06 AM

## 2022-02-19 NOTE — Patient Instructions (Addendum)
Add protein to each meal  Farxiga 2 of '5mg'$  daily  Reduce Metformin to 2 tabs daily  Try to walk 10 to 15 minutes daily regularly

## 2022-02-26 ENCOUNTER — Other Ambulatory Visit: Payer: Self-pay | Admitting: Hematology

## 2022-02-26 DIAGNOSIS — E119 Type 2 diabetes mellitus without complications: Secondary | ICD-10-CM

## 2022-03-06 DIAGNOSIS — E113513 Type 2 diabetes mellitus with proliferative diabetic retinopathy with macular edema, bilateral: Secondary | ICD-10-CM | POA: Insufficient documentation

## 2022-03-11 ENCOUNTER — Ambulatory Visit: Payer: Medicaid Other

## 2022-03-11 NOTE — Progress Notes (Deleted)
03/11/2022 Jennifer Molina Nov 29, 1955 [id number]   HPI:  Jennifer Molina is a 66 y.o. female patient of Dr Harl Bowie, with a PMH below who presents today for hypertension clinic evaluation.    She was seen by Dr. Harl Bowie in March and found to have a BP of 188/100.  Spironolactone was increased from 12.5 to 50 mg. Unfortunately this caused her potassium to jump to 5.4.  This was stopped and instead she was given chlorthalidone 50 mg.  At her last visit, losartan was switched to olmesartan 40 mg and she was asked to move amlodipine to evenings.  She was to check home BP regularly and bring those readings, along with her cuff to next appointment.    She returns today for follow up.  She is in the office with her daughter who is serving as Astronomer.     Past Medical History: hyperlipidemia On atorvastatin  DM2 4/23 A1c 7.3 - on dapagliflozin, glimeperide, metformin; doesn't like to check BS   dementia Daughter answers most questions    Blood Pressure Goal:  130/80  Current Medications: chlorthalidone 50 mg qd, amlodipine 10 mg qd, olmesartan 40 mg qd, metoprolol tart 100 mg bid  Family Hx:  not much known about parents or siblings - 1 son with hypertension, another son died from MI at 100  Social Hx: no tobacco, no alcohol, no regular caffeine  Diet: uses salt regularly, does eat lots of vegetables, not much meat but mostly pork  Exercise: none  Home BP readings: she only looks at systolic readings, by memory range is 130-153  Intolerances: nkda  Labs: 4/23: Na 137, K 4.6, Glu 242, BUN 35, SCr 1.52, GFR 37   Wt Readings from Last 3 Encounters:  02/19/22 157 lb 12.8 oz (71.6 kg)  12/17/21 158 lb 11.2 oz (72 kg)  12/12/21 159 lb 6.4 oz (72.3 kg)   BP Readings from Last 3 Encounters:  02/19/22 (!) 144/82  01/27/22 (!) 168/81  12/17/21 (!) 154/62   Pulse Readings from Last 3 Encounters:  02/19/22 68  01/27/22 74  12/17/21 70    Current Outpatient Medications  Medication Sig  Dispense Refill   acetaminophen (TYLENOL) 500 MG tablet Take 1,000 mg by mouth every 6 (six) hours as needed for mild pain or fever.     amLODipine (NORVASC) 10 MG tablet Take 1 tablet (10 mg total) by mouth daily. (Patient not taking: Reported on 02/19/2022) 60 tablet 0   amLODipine-olmesartan (AZOR) 10-40 MG tablet Take 1 tablet by mouth daily.     ASPIRIN LOW DOSE 81 MG tablet TAKE 1 TABLET(81 MG) BY MOUTH DAILY 30 tablet 2   atorvastatin (LIPITOR) 40 MG tablet Take 1 tablet by mouth once daily 90 tablet 0   Blood Glucose Monitoring Suppl (TRUE METRIX METER) w/Device KIT Use as directed 1 kit 0   chlorthalidone (HYGROTON) 50 MG tablet Take 1 tablet (50 mg total) by mouth daily. 90 tablet 3   dapagliflozin propanediol (FARXIGA) 10 MG TABS tablet Take 1 tablet (10 mg total) by mouth daily before breakfast. 30 tablet 3   FEROSUL 325 (65 Fe) MG tablet TAKE 1 TABLET(325 MG) BY MOUTH DAILY 30 tablet 5   gabapentin (NEURONTIN) 300 MG capsule Take 1 capsule (300 mg total) by mouth at bedtime. 30 capsule 2   glimepiride (AMARYL) 1 MG tablet Take 1 tablet (1 mg total) by mouth 2 (two) times daily with a meal. 180 tablet 0   glucose blood (ACCU-CHEK GUIDE)  test strip 1 each by Other route daily. And lancets 1/day 100 each 3   glucose blood (TRUE METRIX BLOOD GLUCOSE TEST) test strip Use as instructed 100 each 12   metFORMIN (GLUCOPHAGE-XR) 500 MG 24 hr tablet Take 4 tablets (2,000 mg total) by mouth daily. 360 tablet 3   metoprolol tartrate (LOPRESSOR) 100 MG tablet Take 1 tablet (100 mg total) by mouth 2 (two) times daily. 120 tablet 0   olmesartan (BENICAR) 40 MG tablet Take 1 tablet (40 mg total) by mouth daily. (Patient not taking: Reported on 02/19/2022) 90 tablet 3   TRUEplus Lancets 28G MISC Use as directed 100 each 4   No current facility-administered medications for this visit.    No Known Allergies  Past Medical History:  Diagnosis Date   Dementia ([place]) 03/28/2019   DM (diabetes mellitus),  type 2, uncontrolled 2015   Has poor vision.  Family not aware of A1C or what sugars run.  Believe not well controlled.  Not able to monitor due to cost of monitoring equipment   Hypertension 2015    There were no vitals taken for this visit.  No problem-specific Assessment & Plan notes found for this encounter.    Tommy Medal PharmD CPP [facility] [address] [place], [place] [address] [phone]

## 2022-03-14 ENCOUNTER — Ambulatory Visit (INDEPENDENT_AMBULATORY_CARE_PROVIDER_SITE_OTHER): Payer: Medicaid Other | Admitting: Pharmacist Clinician (PhC)/ Clinical Pharmacy Specialist

## 2022-03-14 ENCOUNTER — Other Ambulatory Visit: Payer: Self-pay | Admitting: Family

## 2022-03-14 DIAGNOSIS — I10 Essential (primary) hypertension: Secondary | ICD-10-CM

## 2022-03-14 DIAGNOSIS — E119 Type 2 diabetes mellitus without complications: Secondary | ICD-10-CM

## 2022-03-14 MED ORDER — CARVEDILOL 25 MG PO TABS: 25.0000 mg | ORAL_TABLET | Freq: Two times a day (BID) | ORAL | 3 refills | Status: DC

## 2022-03-14 NOTE — Assessment & Plan Note (Signed)
Patient with essential hypertension, as well as some level of white coat hypertension.  Home meter accurate to within 10 points, and home average 147/78 despite in office reading at 176/82.  Will have her stop metoprolol and start carvedilol 25 mg twice daily for better blood pressure control.  She should continue with other medications.  Patient scheduled to see Dr. Harl Bowie in September.  Asked that she continue with home monitoring and bring that information when she comes in September.

## 2022-03-14 NOTE — Patient Instructions (Signed)
Return for a a follow up appointment with Dr. Harl Bowie in September  Check your blood pressure at home at least 3-4 days each week and keep record of the readings.  Take your BP meds as follows:  Stop metoprolol.  Start carvedilol 25 mg twice daily  Continue with all other medications.  Bring all of your meds, your BP cuff and your record of home blood pressures to your next appointment.  Exercise as you're able, try to walk approximately 30 minutes per day.  Keep salt intake to a minimum, especially watch canned and prepared boxed foods.  Eat more fresh fruits and vegetables and fewer canned items.  Avoid eating in fast food restaurants.    HOW TO TAKE YOUR BLOOD PRESSURE: Rest 5 minutes before taking your blood pressure.  Don't smoke or drink caffeinated beverages for at least 30 minutes before. Take your blood pressure before (not after) you eat. Sit comfortably with your back supported and both feet on the floor (don't cross your legs). Elevate your arm to heart level on a table or a desk. Use the proper sized cuff. It should fit smoothly and snugly around your bare upper arm. There should be enough room to slip a fingertip under the cuff. The bottom edge of the cuff should be 1 inch above the crease of the elbow. Ideally, take 3 measurements at one sitting and record the average.

## 2022-03-14 NOTE — Telephone Encounter (Signed)
Using [employer] for [place] [phone].  Attempted to call and schedule for 6 month check.   Interpreter left a voicemail to call Primary Care at [facility]. Refill request forwarded to [facility] for Amaryl 1 mg tablet.

## 2022-03-14 NOTE — Progress Notes (Unsigned)
03/14/2022 Jennifer Molina November 17, 1955 [id number]   HPI:  Jennifer Molina is a 66 y.o. female patient of Dr Harl Bowie, with a PMH below who presents today for hypertension clinic evaluation.    She was seen by Dr. Harl Bowie in March and found to have a BP of 188/100.  Spironolactone was increased from 12.5 to 50 mg. Unfortunately this caused her potassium to jump to 5.4.  This was stopped and instead she was given chlorthalidone 50 mg.  At her last visit, losartan was switched to olmesartan 40 mg and she was asked to move amlodipine to evenings.  She was to check home BP regularly and bring those readings, along with her cuff to next appointment.    She returns today for follow up.  She is in the office with her daughter and a language interpreter.  States feeling well today and had no issues with the conversion of losartan to olmesartan.  Since then, she saw her neprhologist, who gave her the combination tablet to decrease pill burden.    Past Medical History: hyperlipidemia On atorvastatin  DM2 4/23 A1c 7.3 - on dapagliflozin, glimeperide, metformin; doesn't like to check BS   dementia Daughter answers most questions    Blood Pressure Goal:  130/80  Current Medications: chlorthalidone 50 mg qd, amlodipine 10 mg qd, olmesartan 40 mg qd, metoprolol tart 100 mg bid  Family Hx:  not much known about parents or siblings - 1 son with hypertension, another son died from MI at 52  Social Hx: no tobacco, no alcohol, no regular caffeine  Diet: uses salt regularly, does eat lots of vegetables, not much meat but mostly pork; decreased salt intake since last visit  Exercise: none  Home BP readings: brought home cuff with her to office today.  Omron cuff that read within 10 points of the office machine.  Last 10 home readings on machine showed average of 147/84.   Intolerances: nkda  Labs: 4/23: Na 137, K 4.6, Glu 242, BUN 35, SCr 1.52, GFR 37   Wt Readings from Last 3 Encounters:  02/19/22 157 lb 12.8 oz  (71.6 kg)  12/17/21 158 lb 11.2 oz (72 kg)  12/12/21 159 lb 6.4 oz (72.3 kg)   BP Readings from Last 3 Encounters:  03/14/22 (!) 176/82  02/19/22 (!) 144/82  01/27/22 (!) 168/81   Pulse Readings from Last 3 Encounters:  03/14/22 74  02/19/22 68  01/27/22 74    Current Outpatient Medications  Medication Sig Dispense Refill   amLODipine-olmesartan (AZOR) 10-40 MG tablet Take 1 tablet by mouth daily.     ASPIRIN LOW DOSE 81 MG tablet TAKE 1 TABLET(81 MG) BY MOUTH DAILY 30 tablet 2   atorvastatin (LIPITOR) 40 MG tablet Take 1 tablet by mouth once daily 90 tablet 0   carvedilol (COREG) 25 MG tablet Take 1 tablet (25 mg total) by mouth 2 (two) times daily. 180 tablet 3   chlorthalidone (HYGROTON) 50 MG tablet Take 1 tablet (50 mg total) by mouth daily. 90 tablet 3   dapagliflozin propanediol (FARXIGA) 10 MG TABS tablet Take 1 tablet (10 mg total) by mouth daily before breakfast. 30 tablet 3   FEROSUL 325 (65 Fe) MG tablet TAKE 1 TABLET(325 MG) BY MOUTH DAILY 30 tablet 5   gabapentin (NEURONTIN) 300 MG capsule Take 1 capsule (300 mg total) by mouth at bedtime. 30 capsule 2   glimepiride (AMARYL) 1 MG tablet Take 1 tablet (1 mg total) by mouth 2 (two) times  daily with a meal. 180 tablet 0   metFORMIN (GLUCOPHAGE-XR) 500 MG 24 hr tablet Take 4 tablets (2,000 mg total) by mouth daily. 360 tablet 3   acetaminophen (TYLENOL) 500 MG tablet Take 1,000 mg by mouth every 6 (six) hours as needed for mild pain or fever.     Blood Glucose Monitoring Suppl (TRUE METRIX METER) w/Device KIT Use as directed 1 kit 0   glucose blood (ACCU-CHEK GUIDE) test strip 1 each by Other route daily. And lancets 1/day 100 each 3   glucose blood (TRUE METRIX BLOOD GLUCOSE TEST) test strip Use as instructed 100 each 12   TRUEplus Lancets 28G MISC Use as directed 100 each 4   No current facility-administered medications for this visit.    No Known Allergies  Past Medical History:  Diagnosis Date   Dementia ([place])  03/28/2019   DM (diabetes mellitus), type 2, uncontrolled 2015   Has poor vision.  Family not aware of A1C or what sugars run.  Believe not well controlled.  Not able to monitor due to cost of monitoring equipment   Hypertension 2015    Blood pressure (!) 176/82, pulse 74.  Hypertension Patient with essential hypertension, as well as some level of white coat hypertension.  Home meter accurate to within 10 points, and home average 147/78 despite in office reading at 176/82.  Will have her stop metoprolol and start carvedilol 25 mg twice daily for better blood pressure control.  She should continue with other medications.  Patient scheduled to see Dr. Harl Bowie in September.  Asked that she continue with home monitoring and bring that information when she comes in September.     Tommy Medal PharmD CPP [facility] [address] [place], [place] [address] [phone]

## 2022-04-06 ENCOUNTER — Other Ambulatory Visit: Payer: Self-pay | Admitting: Family

## 2022-04-06 DIAGNOSIS — E785 Hyperlipidemia, unspecified: Secondary | ICD-10-CM

## 2022-04-07 ENCOUNTER — Other Ambulatory Visit (HOSPITAL_COMMUNITY): Payer: Self-pay

## 2022-04-07 MED ORDER — ATORVASTATIN CALCIUM 40 MG PO TABS
40.0000 mg | ORAL_TABLET | Freq: Every day | ORAL | 0 refills | Status: DC
  Filled 2022-04-07: qty 90, 90d supply, fill #0

## 2022-04-09 DIAGNOSIS — H25812 Combined forms of age-related cataract, left eye: Secondary | ICD-10-CM | POA: Insufficient documentation

## 2022-04-09 DIAGNOSIS — H4311 Vitreous hemorrhage, right eye: Secondary | ICD-10-CM | POA: Insufficient documentation

## 2022-04-14 ENCOUNTER — Other Ambulatory Visit: Payer: Self-pay | Admitting: Family

## 2022-04-14 ENCOUNTER — Other Ambulatory Visit: Payer: Self-pay

## 2022-04-14 DIAGNOSIS — C9000 Multiple myeloma not having achieved remission: Secondary | ICD-10-CM

## 2022-04-14 DIAGNOSIS — E785 Hyperlipidemia, unspecified: Secondary | ICD-10-CM

## 2022-04-15 ENCOUNTER — Other Ambulatory Visit: Payer: Self-pay

## 2022-04-15 ENCOUNTER — Inpatient Hospital Stay: Payer: Medicaid Other | Attending: Hematology

## 2022-04-15 DIAGNOSIS — C9000 Multiple myeloma not having achieved remission: Secondary | ICD-10-CM

## 2022-04-15 DIAGNOSIS — D472 Monoclonal gammopathy: Secondary | ICD-10-CM | POA: Diagnosis not present

## 2022-04-15 LAB — CMP (CANCER CENTER ONLY)
ALT: 12 U/L (ref 0–44)
AST: 15 U/L (ref 15–41)
Albumin: 4.4 g/dL (ref 3.5–5.0)
Alkaline Phosphatase: 43 U/L (ref 38–126)
Anion gap: 7 (ref 5–15)
BUN: 29 mg/dL — ABNORMAL HIGH (ref 8–23)
CO2: 24 mmol/L (ref 22–32)
Calcium: 9.6 mg/dL (ref 8.9–10.3)
Chloride: 107 mmol/L (ref 98–111)
Creatinine: 1.51 mg/dL — ABNORMAL HIGH (ref 0.44–1.00)
GFR, Estimated: 38 mL/min — ABNORMAL LOW (ref >60)
Glucose, Bld: 168 mg/dL — ABNORMAL HIGH (ref 70–99)
Potassium: 4.6 mmol/L (ref 3.5–5.1)
Sodium: 138 mmol/L (ref 135–145)
Total Bilirubin: 0.3 mg/dL (ref 0.3–1.2)
Total Protein: 8.5 g/dL — ABNORMAL HIGH (ref 6.5–8.1)

## 2022-04-15 LAB — CBC WITH DIFFERENTIAL (CANCER CENTER ONLY)
Abs Immature Granulocytes: 0.01 10*3/uL (ref 0.00–0.07)
Basophils Absolute: 0 10*3/uL (ref 0.0–0.1)
Basophils Relative: 1 %
Eosinophils Absolute: 0.2 10*3/uL (ref 0.0–0.5)
Eosinophils Relative: 4 %
HCT: 28.3 % — ABNORMAL LOW (ref 36.0–46.0)
Hemoglobin: 9.3 g/dL — ABNORMAL LOW (ref 12.0–15.0)
Immature Granulocytes: 0 %
Lymphocytes Relative: 26 %
Lymphs Abs: 1.5 10*3/uL (ref 0.7–4.0)
MCH: 27 pg (ref 26.0–34.0)
MCHC: 32.9 g/dL (ref 30.0–36.0)
MCV: 82 fL (ref 80.0–100.0)
Monocytes Absolute: 0.5 10*3/uL (ref 0.1–1.0)
Monocytes Relative: 8 %
Neutro Abs: 3.6 10*3/uL (ref 1.7–7.7)
Neutrophils Relative %: 61 %
Platelet Count: 236 10*3/uL (ref 150–400)
RBC: 3.45 MIL/uL — ABNORMAL LOW (ref 3.87–5.11)
RDW: 14.1 % (ref 11.5–15.5)
WBC Count: 5.8 10*3/uL (ref 4.0–10.5)
nRBC: 0 % (ref 0.0–0.2)

## 2022-04-15 LAB — IRON AND IRON BINDING CAPACITY (CC-WL,HP ONLY)
Iron: 60 ug/dL (ref 28–170)
Saturation Ratios: 21 % (ref 10.4–31.8)
TIBC: 293 ug/dL (ref 250–450)
UIBC: 233 ug/dL (ref 148–442)

## 2022-04-15 NOTE — Telephone Encounter (Signed)
Pt request change of pharmacy  Requested Prescriptions  Pending Prescriptions Disp Refills  . atorvastatin (LIPITOR) 40 MG tablet [Pharmacy Med Name: Atorvastatin Calcium 40 MG Oral Tablet] 90 tablet 0    Sig: Take 1 tablet by mouth once daily     Cardiovascular:  Antilipid - Statins Failed - 04/14/2022 10:30 AM      Failed - Lipid Panel in normal range within the last 12 months    Cholesterol, Total  Date Value Ref Range Status  06/19/2021 157 100 - 199 mg/dL Final   LDL Chol Calc (NIH)  Date Value Ref Range Status  06/19/2021 80 0 - 99 mg/dL Final   HDL  Date Value Ref Range Status  06/19/2021 41 >39 mg/dL Final   Triglycerides  Date Value Ref Range Status  06/19/2021 218 (H) 0 - 149 mg/dL Final         Passed - Patient is not pregnant      Passed - Valid encounter within last 12 months    Recent Outpatient Visits          7 months ago Type 2 diabetes mellitus without complication, without long-term current use of insulin ([place])   Primary Care at [facility], Amy J, NP   8 months ago Type 2 diabetes mellitus without complication, without long-term current use of insulin [facility])   Primary Care at [facility], Amy J, NP   9 months ago Essential hypertension   Primary Care at [facility], Amy J, NP   10 months ago Medicare annual wellness visit, initial   Primary Care at [facility], Amy J, NP   11 months ago Encounter to establish care   Primary Care at [facility], Flonnie Hailstone, NP      Future Appointments            In 2 weeks Branch, Royetta Crochet, MD [place]. [place]

## 2022-04-16 LAB — KAPPA/LAMBDA LIGHT CHAINS
Kappa free light chain: 60.2 mg/L — ABNORMAL HIGH (ref 3.3–19.4)
Kappa, lambda light chain ratio: 2.71 — ABNORMAL HIGH (ref 0.26–1.65)
Lambda free light chains: 22.2 mg/L (ref 5.7–26.3)

## 2022-04-22 ENCOUNTER — Other Ambulatory Visit: Payer: Self-pay

## 2022-04-22 ENCOUNTER — Inpatient Hospital Stay: Payer: Medicaid Other | Attending: Hematology | Admitting: Hematology

## 2022-04-22 VITALS — BP 185/82 | HR 99 | Temp 97.9°F | Wt 156.9 lb

## 2022-04-22 DIAGNOSIS — N189 Chronic kidney disease, unspecified: Secondary | ICD-10-CM | POA: Diagnosis not present

## 2022-04-22 DIAGNOSIS — C9000 Multiple myeloma not having achieved remission: Secondary | ICD-10-CM

## 2022-04-22 DIAGNOSIS — E1122 Type 2 diabetes mellitus with diabetic chronic kidney disease: Secondary | ICD-10-CM | POA: Diagnosis not present

## 2022-04-22 DIAGNOSIS — M542 Cervicalgia: Secondary | ICD-10-CM | POA: Diagnosis not present

## 2022-04-22 DIAGNOSIS — D649 Anemia, unspecified: Secondary | ICD-10-CM | POA: Insufficient documentation

## 2022-04-22 DIAGNOSIS — D472 Monoclonal gammopathy: Secondary | ICD-10-CM | POA: Diagnosis present

## 2022-04-22 DIAGNOSIS — I129 Hypertensive chronic kidney disease with stage 1 through stage 4 chronic kidney disease, or unspecified chronic kidney disease: Secondary | ICD-10-CM | POA: Diagnosis not present

## 2022-04-22 LAB — MULTIPLE MYELOMA PANEL, SERUM
Albumin SerPl Elph-Mcnc: 3.9 g/dL (ref 2.9–4.4)
Albumin/Glob SerPl: 1 (ref 0.7–1.7)
Alpha 1: 0.2 g/dL (ref 0.0–0.4)
Alpha2 Glob SerPl Elph-Mcnc: 0.9 g/dL (ref 0.4–1.0)
B-Globulin SerPl Elph-Mcnc: 0.9 g/dL (ref 0.7–1.3)
Gamma Glob SerPl Elph-Mcnc: 2.3 g/dL — ABNORMAL HIGH (ref 0.4–1.8)
Globulin, Total: 4.2 g/dL — ABNORMAL HIGH (ref 2.2–3.9)
IgA: 168 mg/dL (ref 87–352)
IgG (Immunoglobin G), Serum: 2446 mg/dL — ABNORMAL HIGH (ref 586–1602)
IgM (Immunoglobulin M), Srm: 62 mg/dL (ref 26–217)
M Protein SerPl Elph-Mcnc: 1.9 g/dL — ABNORMAL HIGH
Total Protein ELP: 8.1 g/dL (ref 6.0–8.5)

## 2022-04-22 NOTE — Progress Notes (Signed)
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  She is accompanied by her daughter. She reports She is doing well with no new symptoms or concerns.  She notes pain throughout left neck, left arm, and left leg. She notes no facial pain. And she reports some leg swelling in her left leg.  No numbness or weakness in left arm. Her BP at home is usually around 190 and she notes her BP remains uncontrolled. She notes she stopped taking Carvedilol due to dizziness likely due to low PR. She notes no recent injuries. She further notes the pain started roughly 2-3 days ago and came on suddenly. Her daughter notes that this is not new and she has had these symptoms before and they would last about a day. We discussed that she needs to follow up with cardiology to figure out a different medication for her to take. We also discussed getting a PET CT scan and MRI for further evaluation which she is agreeable to. We further discussed if she loses movement in her arm or experiences worsening weakness, she is advised to go to ED.  Her cardiologist is managing her hypertension and her endocrinologist has been primarily managing her diabetes. Patient's daughter notes that the hypertension and diabetes are still not well controlled.  No fever, chills, night sweats. No new focal bone pains. No other new or acute  focal symptoms.  Labs done on 04/15/2022 were discussed in detail.  MEDICAL HISTORY:  Past Medical History:  Diagnosis Date   Dementia ([place]) 03/28/2019   DM (diabetes mellitus), type 2, uncontrolled 2015   Has poor vision.  Family not aware of A1C or what sugars run.  Believe not well controlled.  Not able to monitor due to cost of monitoring equipment   Hypertension 2015    SURGICAL HISTORY: No past surgical history on file.  SOCIAL HISTORY: Social History   Socioeconomic History   Marital status: Married    Spouse name: [name]   Number of children: 7   Years of education: 3   Highest education level: 3rd grade  Occupational History   Occupation: Retired from farming in [place]  Tobacco Use   Smoking status: Never   Smokeless tobacco: Never  Vaping Use   Vaping Use: Never used  Substance and Sexual Activity   Alcohol use: Never   Drug use: Never   Sexual activity: Not on file  Other Topics Concern   Not on file  Social History Narrative   [place] to [place] in [place]   Lives at home with husband   Her 2 daughters, 2 sons and their families   All together, 44 people in the home   Social Determinants of Health   Financial Resource Strain: Not on file  Food Insecurity: Not on file  Transportation Needs: Not on file  Physical Activity: Not on file  Stress: Not on file  Social Connections: Not on file  Intimate Partner Violence:  Not on file    FAMILY HISTORY: Family History  Problem Relation Age of Onset   Diabetes Mother    Diabetes Mellitus II Neg Hx     ALLERGIES:  has No Known Allergies.  MEDICATIONS:  Current Outpatient Medications  Medication Sig Dispense Refill   acetaminophen (TYLENOL) 500 MG tablet Take 1,000 mg by mouth every 6 (six) hours as needed for mild pain or fever.     amLODipine-olmesartan (AZOR) 10-40 MG tablet Take 1 tablet by mouth daily.     ASPIRIN LOW DOSE 81 MG tablet TAKE 1 TABLET(81 MG) BY MOUTH DAILY 30 tablet 2   atorvastatin  (LIPITOR) 40 MG tablet Take 1 tablet by mouth once daily 90 tablet 0   Blood Glucose Monitoring Suppl (TRUE METRIX METER) w/Device KIT Use as directed 1 kit 0   carvedilol (COREG) 25 MG tablet Take 1 tablet (25 mg total) by mouth 2 (two) times daily. 180 tablet 3   chlorthalidone (HYGROTON) 50 MG tablet Take 1 tablet (50 mg total) by mouth daily. 90 tablet 3   dapagliflozin propanediol (FARXIGA) 10 MG TABS tablet Take 1 tablet (10 mg total) by mouth daily before breakfast. 30 tablet 3   FEROSUL 325 (65 Fe) MG tablet TAKE 1 TABLET(325 MG) BY MOUTH DAILY 30 tablet 5   gabapentin (NEURONTIN) 300 MG capsule Take 1 capsule (300 mg total) by mouth at bedtime. 30 capsule 2   glimepiride (AMARYL) 1 MG tablet TAKE 1 TABLET(1 MG) BY MOUTH TWICE DAILY WITH A MEAL 180 tablet 0   glucose blood (ACCU-CHEK GUIDE) test strip 1 each by Other route daily. And lancets 1/day 100 each 3   glucose blood (TRUE METRIX BLOOD GLUCOSE TEST) test strip Use as instructed 100 each 12   metFORMIN (GLUCOPHAGE-XR) 500 MG 24 hr tablet Take 4 tablets (2,000 mg total) by mouth daily. 360 tablet 3   TRUEplus Lancets 28G MISC Use as directed 100 each 4   No current facility-administered medications for this visit.    REVIEW OF SYSTEMS:   10 Point review of Systems was done is negative except as noted above.  PHYSICAL EXAMINATION: ECOG PERFORMANCE STATUS: 1 - Symptomatic but completely ambulatory  . There were no vitals filed for this visit.   There were no vitals filed for this visit.   .There is no height or weight on file to calculate BMI. NAD GENERAL:alert, in no acute distress and comfortable SKIN: no acute rashes, no significant lesions EYES: conjunctiva are pink and non-injected, sclera anicteric NECK: supple, no JVD. Tenderness to palpation over left shoulder and neck. LYMPH:  no palpable lymphadenopathy in the cervical, axillary or inguinal regions LUNGS: clear to auscultation b/l with normal respiratory  effort HEART: regular rate & rhythm ABDOMEN:  normoactive bowel sounds , non tender, not distended. Extremity: no pedal edema PSYCH: alert & oriented x 3 with fluent speech NEURO: no focal motor/sensory deficits  LABORATORY DATA:  I have reviewed the data as listed  .    Latest Ref Rng & Units 04/15/2022    9:27 AM 11/29/2021    9:08 AM 08/30/2021   10:10 AM  CBC  WBC 4.0 - 10.5 K/uL 5.8  5.5  7.0   Hemoglobin 12.0 - 15.0 g/dL 9.3  8.9  9.1   Hematocrit 36.0 - 46.0 % 28.3  27.8  28.2   Platelets 150 - 400 K/uL 236  214  264    CBC    Component Value Date/Time   WBC  5.8 04/15/2022 0927   WBC 7.8 10/02/2020 1504   RBC 3.45 (L) 04/15/2022 0927   HGB 9.3 (L) 04/15/2022 0927   HGB 12.2 03/28/2019 1143   HCT 28.3 (L) 04/15/2022 0927   HCT 37.4 03/28/2019 1143   PLT 236 04/15/2022 0927   PLT 295 03/28/2019 1143   MCV 82.0 04/15/2022 0927   MCV 83 03/28/2019 1143   MCH 27.0 04/15/2022 0927   MCHC 32.9 04/15/2022 0927   RDW 14.1 04/15/2022 0927   RDW 14.4 03/28/2019 1143   LYMPHSABS 1.5 04/15/2022 0927   LYMPHSABS 2.5 03/28/2019 1143   MONOABS 0.5 04/15/2022 0927   EOSABS 0.2 04/15/2022 0927   EOSABS 0.1 03/28/2019 1143   BASOSABS 0.0 04/15/2022 0927   BASOSABS 0.1 03/28/2019 1143       Latest Ref Rng & Units 04/15/2022    9:27 AM 11/29/2021    9:08 AM 11/13/2021    3:21 PM  CMP  Glucose 70 - 99 mg/dL 168  242  105   BUN 8 - 23 mg/dL 29  35  31   Creatinine 0.44 - 1.00 mg/dL 1.51  1.52  1.76   Sodium 135 - 145 mmol/L 138  137  140   Potassium 3.5 - 5.1 mmol/L 4.6  4.6  5.4   Chloride 98 - 111 mmol/L 107  108  105   CO2 22 - 32 mmol/L 24  24  23    Calcium 8.9 - 10.3 mg/dL 9.6  9.1  9.3   Total Protein 6.5 - 8.1 g/dL 8.5  8.2    Total Bilirubin 0.3 - 1.2 mg/dL 0.3  0.4    Alkaline Phos 38 - 126 U/L 43  37    AST 15 - 41 U/L 15  16    ALT 0 - 44 U/L 12  24     . Lab Results  Component Value Date   IRON 60 04/15/2022   TIBC 293 04/15/2022   IRONPCTSAT 21  04/15/2022   (Iron and TIBC)  Lab Results  Component Value Date   FERRITIN 489 (H) 08/30/2021     09/24/2020 Surgical Pathology DIAGNOSIS:   BONE MARROW, ASPIRATE, CLOT, CORE:  -Slightly hypercellular bone marrow for age with plasma cell neoplasm  -See comment   PERIPHERAL BLOOD:  -Normocytic-normochromic anemia   COMMENT:   The bone marrow is hypercellular for age with slight increase in plasma  cells representing 8% of all cells in the aspirate with lack of large  aggregates or sheets.  Immunohistochemical stains highlight the  increased plasma cell component in the clot/biopsy sections correlating  with the aspirate.  The plasma cells display kappa light chain  restriction consistent with plasma cell neoplasm.  Correlation with  cytogenetic and FISH studies recommended.   09/24/2020 Molecular Pathology    09/24/2020 Cytogenetics Report    RADIOGRAPHIC STUDIES: I have personally reviewed the radiological images as listed and agreed with the findings in the report. No results found.   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 Iron levels adequate. Will likely need consideration of erythropoietin if hemoglobin remains less than 9 and hypertension is well controlled.  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 Unlikely to be related to her monoclonal paraproteinemia-follows with Dr. Trudi Ida completely rule out possibility of light  chain deposition disease or AL amyloidosis. We will defer to nephrology regarding need for kidney biopsy to rule out these possibilities if indicated.  PLAN: -Patient's labs from 04/15/2022 were discussed in detail with her and her daughter. Her CBC shows normocytic anemia which is stable at her baseline close to  about 9.  WBC count and platelets within normal limits. CMP shows chronic kidney disease baseline creatinine fluctuating in the 1.3-1.7 range. Myeloma panel shows M spike pslightly increased to 1.9g/dL  Patient has no clinical or lab findings suggestive of progression to multiple myeloma at this time. We discussed that she should continue her vitamin B complex daily. Continue to optimize her hypertension and diabetes management is extremely important to protect her kidneys and also reduce shocks to her bone marrow causing worsening anemia. We previously discussed that she might need ESA's at some point however we will hold off currently due to her uncontrolled hypertension. -We will continue to monitor iron labs to determine if she needs IV iron replacement to maintain her ferritin more than 250 and iron saturation of more than 20%. -No IV iron at this time -Scheduled PET CT scan and MRI for further evaluation of left side weakness and pain. -We discussed that she needs to follow up with cardiology to figure out a different medication for her to take. -We further discussed if she loses movement in her arm or experiences worsening weakness, she is advised to go to ED.   FOLLOW UP: MRI c spine in 1 week PET/CT in 1 week PHone visit with Dr Irene Limbo in 3 weeks  The total time spent in the appointment was  minutes*.  All of the patient's questions were answered with apparent satisfaction. The patient knows to call the clinic with any problems, questions or concerns.   Sullivan Lone MD MS AAHIVMS [facility] Hematology/Oncology Physician [facility]  .*Total Encounter Time as defined by the Centers for Medicare and Medicaid Services includes, in addition to the face-to-face time of a patient visit (documented in the note above) non-face-to-face time: obtaining and reviewing outside history, ordering and reviewing medications, tests or procedures, care coordination (communications with  other health care professionals or caregivers) and documentation in the medical record.  I, Melene Muller, am acting as scribe for Dr. Sullivan Lone, MD. .I have reviewed the above documentation for accuracy and completeness, and I agree with the above.[name] MD

## 2022-04-23 ENCOUNTER — Other Ambulatory Visit (HOSPITAL_COMMUNITY): Payer: Self-pay

## 2022-04-25 ENCOUNTER — Telehealth: Payer: Self-pay | Admitting: Hematology

## 2022-04-25 NOTE — Telephone Encounter (Signed)
Scheduled follow-up appointment per 9/5 los. Patient's daughter is aware.

## 2022-04-29 ENCOUNTER — Ambulatory Visit: Payer: Medicaid Other | Attending: Internal Medicine | Admitting: Internal Medicine

## 2022-05-02 ENCOUNTER — Encounter: Payer: Medicaid Other | Attending: Endocrinology | Admitting: Dietician

## 2022-05-02 ENCOUNTER — Encounter: Payer: Self-pay | Admitting: Dietician

## 2022-05-02 DIAGNOSIS — E1122 Type 2 diabetes mellitus with diabetic chronic kidney disease: Secondary | ICD-10-CM | POA: Diagnosis present

## 2022-05-02 DIAGNOSIS — N1832 Chronic kidney disease, stage 3b: Secondary | ICD-10-CM | POA: Diagnosis present

## 2022-05-02 NOTE — Patient Instructions (Signed)
Begin eating breakfast every day.  Rice, vegetables, tofu or egg  Begin adding fruit every day with a handful of nuts.  Choose a protein with each meal and snack  Include a protein rich food in each meal:  Tofu  Edemame or other soy beans  Sesame seeds or unsalted nuts  Eggs  Fish   Avoid foods that have a lot of salt - salt can increase your blood pressure.   Limit soy sauce and added salt

## 2022-05-02 NOTE — Progress Notes (Signed)
Diabetes Self-Management Education  Visit Type: First/Initial  Appt. Start Time: 1315 Appt. End Time: 5397  05/02/2022  Ms. Jennifer Molina, identified by name and date of birth, is a 66 y.o. female with a diagnosis of Diabetes: Type 2.   ASSESSMENT Patient is here today with her son and interpretor Kendell Bane with [employer]. Appetite is poor and eats less but improved over the past week. 5 lb weight loss in the past 2 months.  History includes:  Type 2 Diabetes, HTN, dementia, multiple myeloma, CKD, anemia. Medications include:  Wilder Glade, metformin (although her and her family are not familiar with the names and poor historian) A1C 7.3% 12/12/2021  Patient lives with her children.  The children do the cooking and shopping. She speaks [place] and requires an Veterinary surgeon. She came to the [place] in 2009.  She can read a little [place]. Prefers vegetables and dislikes meat. Does not eat fruit. Craves things that are very salty.  (Preserved/salted fish)  Height _0  (1.6 m), weight 153 lb (69.4 kg). Body mass index is 27.1 kg/m.   Diabetes Self-Management Education - 05/02/22 1330       Visit Information   Visit Type First/Initial      Initial Visit   Diabetes Type Type 2    Date Diagnosed 2021    Are you currently following a meal plan? No    Are you taking your medications as prescribed? Yes      Health Coping   How would you rate your overall health? Fair      Psychosocial Assessment   Patient Belief/Attitude about Diabetes Motivated to manage diabetes    What is the hardest part about your diabetes right now, causing you the most concern, or is the most worrisome to you about your diabetes?   Checking blood sugar    Self-care barriers English as a second language;Low literacy    Self-management support Doctor's office;Family    Other persons present Patient;Family Member;Interpreter    Patient Concerns Nutrition/Meal planning    Special Needs None    Preferred Learning  Style No preference indicated    Learning Readiness Ready    How often do you need to have someone help you when you read instructions, pamphlets, or other written materials from your doctor or pharmacy? 5 - Always    What is the last grade level you completed in school? 3rd      Pre-Education Assessment   Patient understands the diabetes disease and treatment process. Needs Instruction    Patient understands incorporating nutritional management into lifestyle. Needs Instruction    Patient undertands incorporating physical activity into lifestyle. Needs Instruction    Patient understands using medications safely. Needs Instruction    Patient understands monitoring blood glucose, interpreting and using results Needs Instruction    Patient understands prevention, detection, and treatment of acute complications. Needs Instruction    Patient understands prevention, detection, and treatment of chronic complications. Needs Instruction    Patient understands how to develop strategies to address psychosocial issues. Needs Instruction    Patient understands how to develop strategies to promote health/change behavior. Needs Instruction      Complications   Last HgB A1C per patient/outside source 7.3 %   12/12/2021   How often do you check your blood sugar? 0 times/day (not testing)   "It hurts so she has not checked this in the past week."   Have you had a dilated eye exam in the past 12 months? Yes  Have you had a dental exam in the past 12 months? Yes    Are you checking your feet? Yes    How many days per week are you checking your feet? 7      Dietary Intake   Breakfast none OR rice and meat and vegetables "enough to take pills"    Dinner rice, vegetables   3-4   Beverage(s) water      Activity / Exercise   Activity / Exercise Type Light (walking / raking leaves)   can't walk far as this causes her more fatigue     Patient Education   Previous Diabetes Education Yes (please comment)    diet information per patient   Healthy Eating Meal options for control of blood glucose level and chronic complications.;Other (comment)   meal options to add for balanced nutrition, anemia nutrition education   Medications Reviewed patients medication for diabetes, action, purpose, timing of dose and side effects.    Chronic complications Relationship between chronic complications and blood glucose control    Diabetes Stress and Support Identified and addressed patients feelings and concerns about diabetes;Worked with patient to identify barriers to care and solutions      Individualized Goals (developed by patient)   Nutrition Other (comment)   low sodium carb mod diet appropriate for CKD   Physical Activity Not Applicable    Medications take my medication as prescribed    Problem Solving Eating Pattern      Post-Education Assessment   Patient understands the diabetes disease and treatment process. Comprehends key points    Patient understands incorporating nutritional management into lifestyle. Comprehends key points    Patient undertands incorporating physical activity into lifestyle. Comprehends key points    Patient understands using medications safely. Comphrehends key points    Patient understands monitoring blood glucose, interpreting and using results Comprehends key points    Patient understands prevention, detection, and treatment of acute complications. Comprehends key points    Patient understands prevention, detection, and treatment of chronic complications. Comprehends key points    Patient understands how to develop strategies to address psychosocial issues. Needs Review    Patient understands how to develop strategies to promote health/change behavior. Needs Review      Outcomes   Expected Outcomes Demonstrated interest in learning but significant barriers to change    Future DMSE 2 months    Program Status Not Completed             Individualized Plan for Diabetes  Self-Management Training:   Learning Objective:  Patient will have a greater understanding of diabetes self-management. Patient education plan is to attend individual and/or group sessions per assessed needs and concerns.   Plan:   Patient Instructions  Begin eating breakfast every day.  Rice, vegetables, tofu or egg  Begin adding fruit every day with a handful of nuts.  Choose a protein with each meal and snack  Include a protein rich food in each meal:  Tofu  Edemame or other soy beans  Sesame seeds or unsalted nuts  Eggs  Fish   Avoid foods that have a lot of salt - salt can increase your blood pressure.   Limit soy sauce and added salt    Expected Outcomes:  Demonstrated interest in learning but significant barriers to change  Education material provided: Anemia nutrition therapy from [facility]  If problems or questions, patient to contact team via:  Phone  Future DSME appointment: 2 months

## 2022-05-13 ENCOUNTER — Inpatient Hospital Stay: Payer: Medicaid Other | Admitting: Hematology

## 2022-05-13 LAB — HM DIABETES EYE EXAM

## 2022-05-15 ENCOUNTER — Other Ambulatory Visit (INDEPENDENT_AMBULATORY_CARE_PROVIDER_SITE_OTHER): Payer: Medicaid Other

## 2022-05-15 DIAGNOSIS — E1165 Type 2 diabetes mellitus with hyperglycemia: Secondary | ICD-10-CM

## 2022-05-15 DIAGNOSIS — E782 Mixed hyperlipidemia: Secondary | ICD-10-CM

## 2022-05-15 LAB — LDL CHOLESTEROL, DIRECT: Direct LDL: 74 mg/dL

## 2022-05-15 LAB — GLUCOSE, RANDOM: Glucose, Bld: 140 mg/dL — ABNORMAL HIGH (ref 70–99)

## 2022-05-15 LAB — HEMOGLOBIN A1C: Hgb A1c MFr Bld: 7.4 % — ABNORMAL HIGH (ref 4.6–6.5)

## 2022-05-15 NOTE — Patient Instructions (Signed)
9

## 2022-05-16 LAB — FRUCTOSAMINE: Fructosamine: 286 umol/L — ABNORMAL HIGH (ref 0–285)

## 2022-05-17 IMAGING — US US RENAL
1 series · 14 of 25 positions shown · non-contrast
Comparison: None

CLINICAL DATA: Chronic kidney disease stage IIIa, diabetes
mellitus, hypertension

EXAM:
RENAL / URINARY TRACT ULTRASOUND COMPLETE

[Series 1: us renal · 0.23mm/px · 14 of 42 slices shown]
[im 1/42]
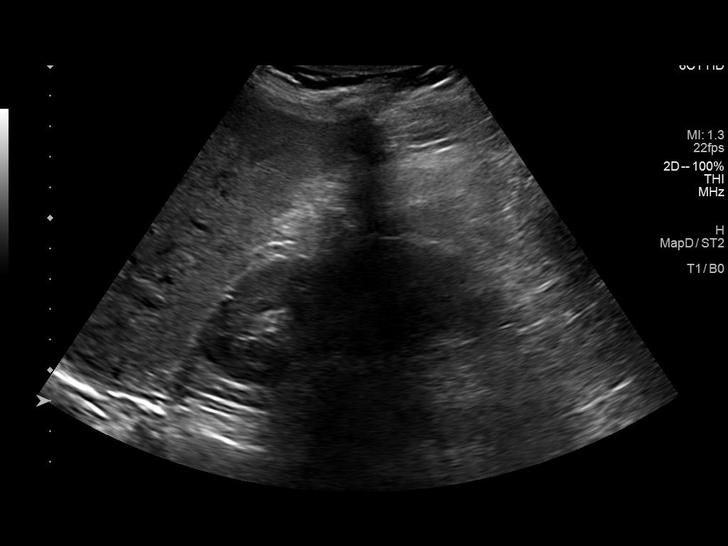
[im 4/42]
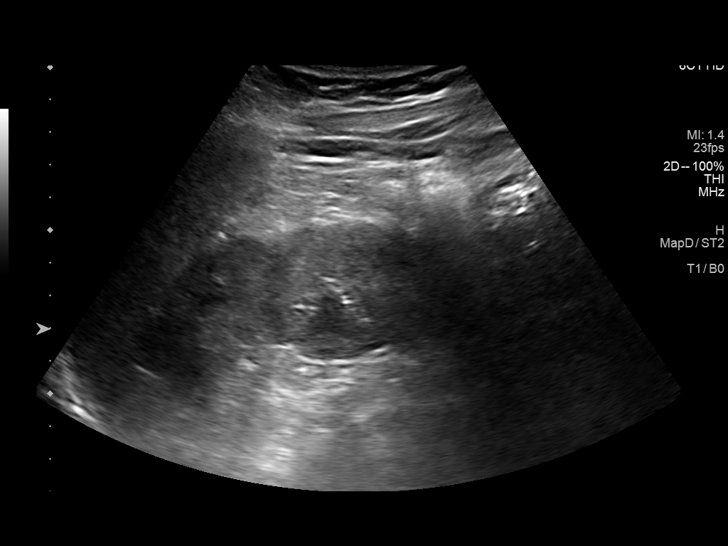
[im 7/42]
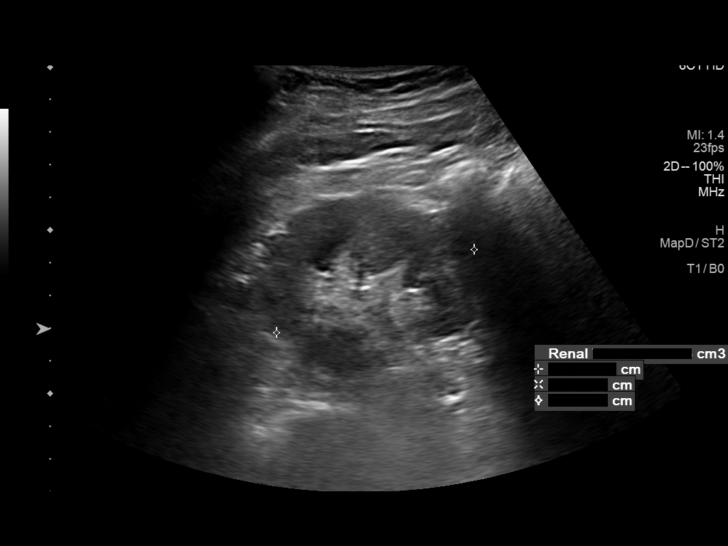
[im 11/42]
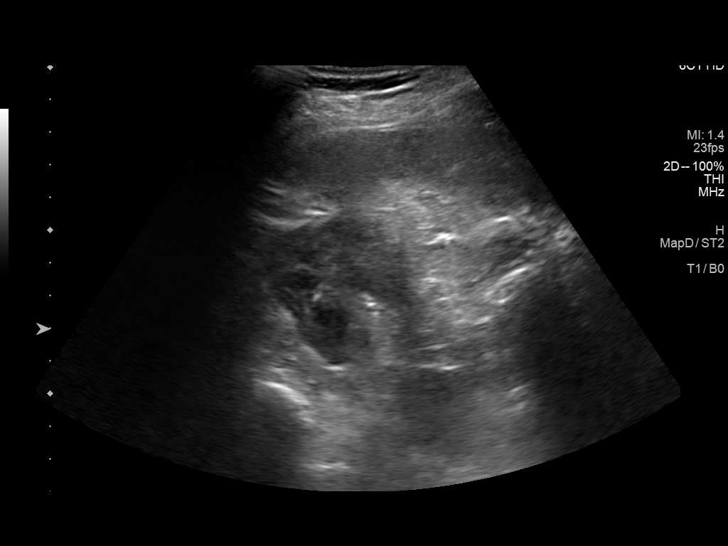
[im 14/42]
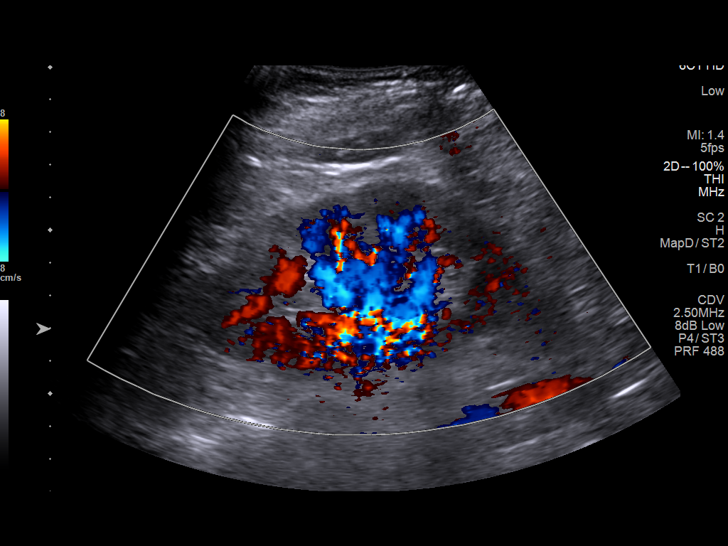
[im 16/42]
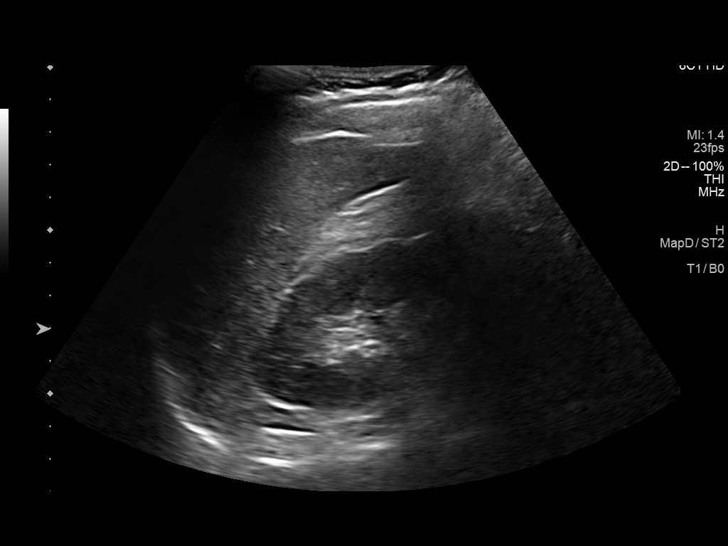
[im 19/42]
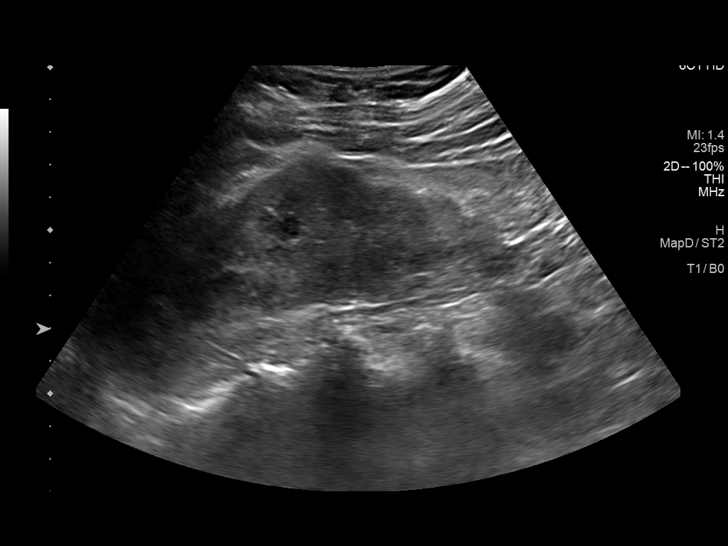
[im 23/42]
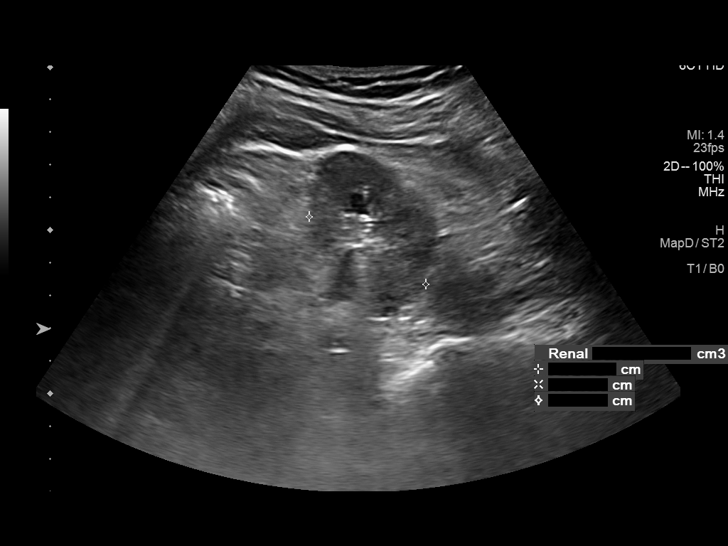
[im 26/42]
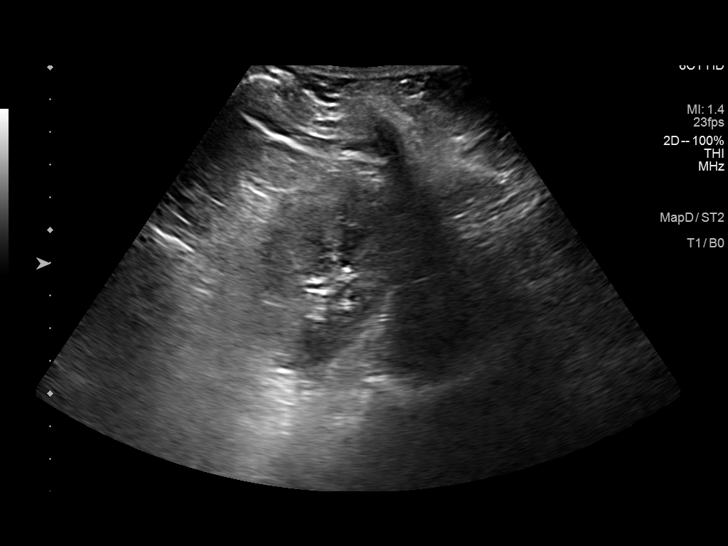
[im 28/42]
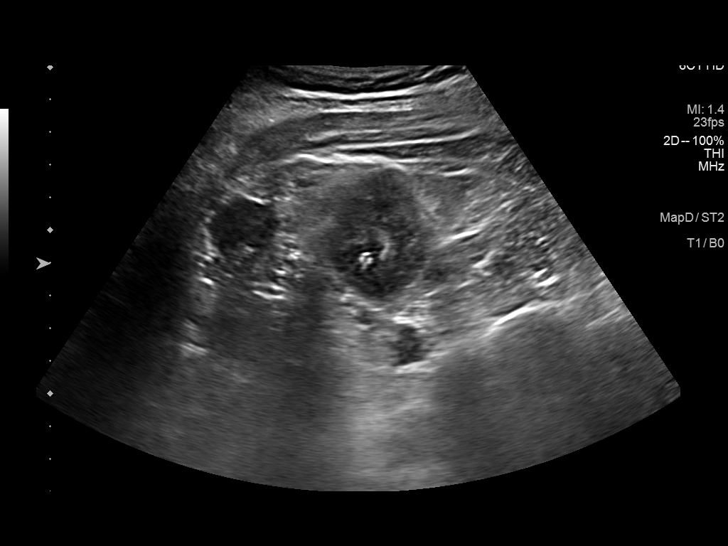
[im 31/42]
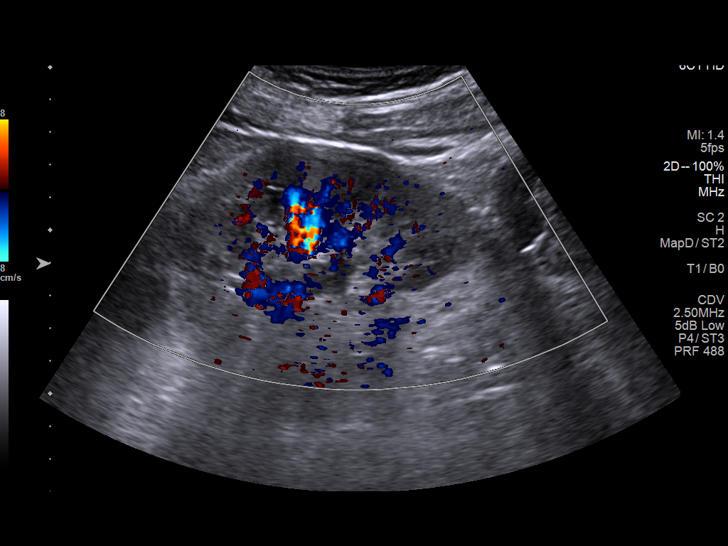
[im 35/42]
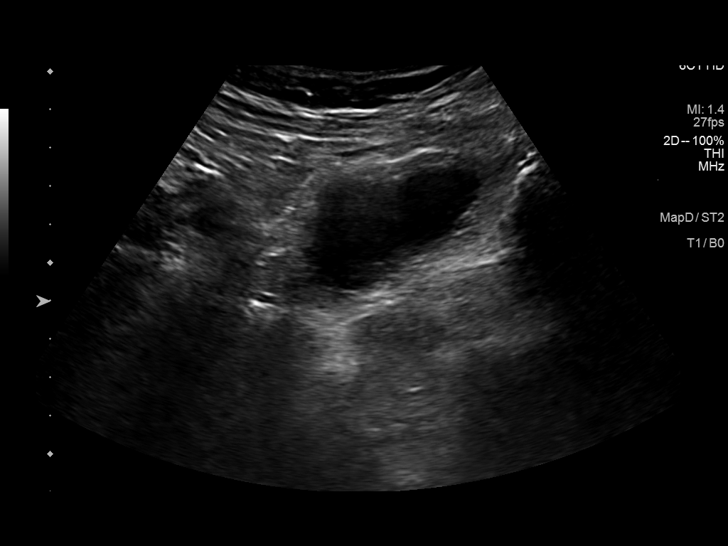
[im 38/42]
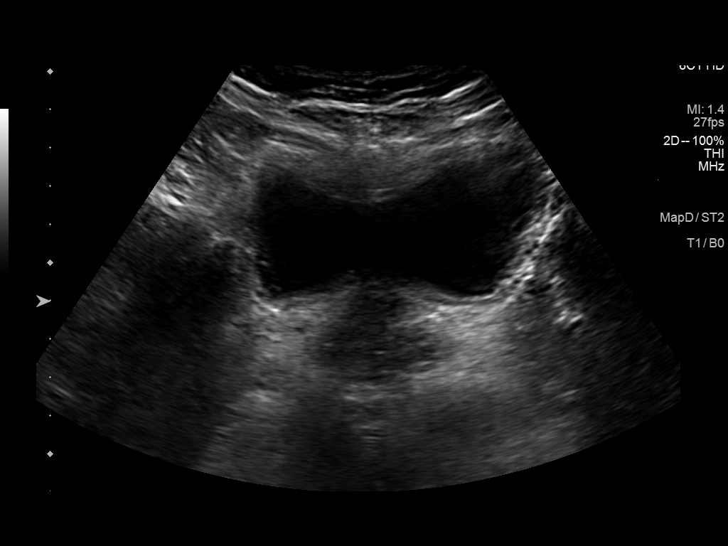
[im 42/42]
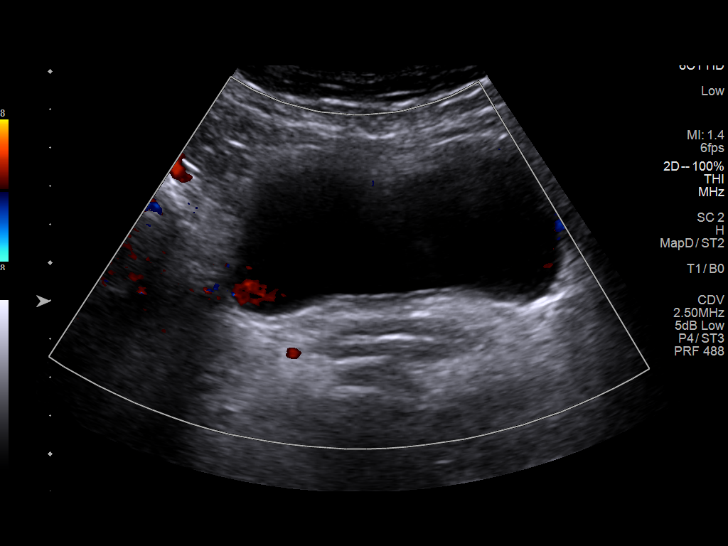

[14 of 25 positions shown; findings below may reference images not displayed]

FINDINGS: Right Kidney:

Renal measurements: 11.2 x 5.0 x 6.6 cm = volume: 192 mL. Normal
cortical thickness. Slightly increased cortical echogenicity. No
mass, hydronephrosis, or shadowing calcification.

Left Kidney:

Renal measurements: 11.6 x 5.2 x 4.1 cm = volume: 130 mL. Normal
cortical thickness. Slightly increased cortical echogenicity. No
mass, hydronephrosis, or shadowing calcification.

Bladder:

Appears normal for degree of bladder distention.

Other:

None.
IMPRESSION: Suspect medical renal disease changes.

No evidence of renal mass or hydronephrosis.

## 2022-05-20 ENCOUNTER — Ambulatory Visit (INDEPENDENT_AMBULATORY_CARE_PROVIDER_SITE_OTHER): Payer: Medicaid Other | Admitting: Endocrinology

## 2022-05-20 ENCOUNTER — Encounter: Payer: Self-pay | Admitting: Endocrinology

## 2022-05-20 VITALS — BP 164/86 | HR 83 | Ht 63.0 in | Wt 149.4 lb

## 2022-05-20 DIAGNOSIS — E113519 Type 2 diabetes mellitus with proliferative diabetic retinopathy with macular edema, unspecified eye: Secondary | ICD-10-CM

## 2022-05-20 DIAGNOSIS — Z2989 Encounter for other specified prophylactic measures: Secondary | ICD-10-CM

## 2022-05-20 DIAGNOSIS — E1122 Type 2 diabetes mellitus with diabetic chronic kidney disease: Secondary | ICD-10-CM

## 2022-05-20 DIAGNOSIS — Z23 Encounter for immunization: Secondary | ICD-10-CM | POA: Diagnosis not present

## 2022-05-20 DIAGNOSIS — N1832 Chronic kidney disease, stage 3b: Secondary | ICD-10-CM

## 2022-05-20 DIAGNOSIS — E1165 Type 2 diabetes mellitus with hyperglycemia: Secondary | ICD-10-CM | POA: Diagnosis not present

## 2022-05-20 NOTE — Patient Instructions (Addendum)
Reduce Metformin to 2 daily  Check blood sugars on waking up 2-3 days a week  Also check blood sugars about 2 hours after meals and do this after different meals by rotation  Recommended blood sugar levels on waking up are 90-130 and about 2 hours after meal is 130-160  Please bring your blood sugar monitor to each visit, thank you

## 2022-05-20 NOTE — Progress Notes (Signed)
Patient ID: Jennifer Molina, female   DOB: [birth year], 66 y.o.   MRN: [id number]           Reason for Appointment: Type II Diabetes follow-up   History of Present Illness   Diagnosis date: 2019  Previous history:  Non-insulin hypoglycemic drugs previously used: Metformin, Amaryl Has not been on insulin except in the hospital in 2020  A1c range in the last few years is: 7.3-9.1  Recent history:     Non-insulin hypoglycemic drugs: Metformin 2g daily,   Farxiga 10 mg daily       Side effects from medications: None  Current self management, blood sugar patterns and problems identified:  A1c is most recently 7.4 compared to 7.3 Fructosamine 286 Patient is unable to give a history and information was obtained from her daughters, however difficult to get full information  Even though she has seen the dietitian not clear if she is following the meal plan recommended She is still having somewhat decreased appetite is losing weight She does not check her sugars fasting and mostly sometimes right after lunch Fasting lab glucose was 140 but she does not think her sugars are more than 135 at home [place] Glade was increased in 7/23 to 10 mg  Renal function is stable  Exercise: walking 10 to 15 minutes a day  Diet management: Trying to eat 3 meals a day Has more tofu for protein than meat     Hypoglycemia:  none    Glucometer:       True Metrix  Blood Glucose readings by recall range from 120-135   Dietician visit: Most recent: 723  Weight control:  Wt Readings from Last 3 Encounters:  05/20/22 149 lb 6.4 oz (67.8 kg)  05/02/22 153 lb (69.4 kg)  04/22/22 156 lb 14.4 oz (71.2 kg)            Diabetes labs:  Lab Results  Component Value Date   HGBA1C 7.4 (H) 05/15/2022   HGBA1C 7.3 (A) 12/12/2021   HGBA1C 8.0 (A) 10/09/2021   Lab Results  Component Value Date   LDLCALC 80 06/19/2021   CREATININE 1.51 (H) 04/15/2022   Lab Results  Component Value Date   FRUCTOSAMINE 286 (H)  05/15/2022     Allergies as of 05/20/2022   No Known Allergies      Medication List        Accurate as of May 20, 2022  8:13 AM. If you have any questions, ask your nurse or doctor.          acetaminophen 500 MG tablet Commonly known as: TYLENOL Take 1,000 mg by mouth every 6 (six) hours as needed for mild pain or fever.   amLODipine-olmesartan 10-40 MG tablet Commonly known as: AZOR Take 1 tablet by mouth daily.   Aspirin Low Dose 81 MG tablet Generic drug: aspirin EC TAKE 1 TABLET(81 MG) BY MOUTH DAILY   atorvastatin 40 MG tablet Commonly known as: LIPITOR Take 1 tablet by mouth once daily   carvedilol 25 MG tablet Commonly known as: COREG Take 1 tablet (25 mg total) by mouth 2 (two) times daily.   chlorthalidone 50 MG tablet Commonly known as: HYGROTON Take 1 tablet (50 mg total) by mouth daily.   dapagliflozin propanediol 10 MG Tabs tablet Commonly known as: Farxiga Take 1 tablet (10 mg total) by mouth daily before breakfast.   FeroSul 325 (65 FE) MG tablet Generic drug: ferrous sulfate TAKE 1 TABLET(325 MG) BY MOUTH DAILY   gabapentin  300 MG capsule Commonly known as: NEURONTIN Take 1 capsule (300 mg total) by mouth at bedtime.   glimepiride 1 MG tablet Commonly known as: AMARYL TAKE 1 TABLET(1 MG) BY MOUTH TWICE DAILY WITH A MEAL   metFORMIN 500 MG 24 hr tablet Commonly known as: GLUCOPHAGE-XR Take 4 tablets (2,000 mg total) by mouth daily.   True Metrix Blood Glucose Test test strip Generic drug: glucose blood Use as instructed   Accu-Chek Guide test strip Generic drug: glucose blood 1 each by Other route daily. And lancets 1/day   True Metrix Meter w/Device Kit Use as directed   TRUEplus Lancets 28G Misc Use as directed        Allergies: No Known Allergies  Past Medical History:  Diagnosis Date   Dementia ([place]) 03/28/2019   DM (diabetes mellitus), type 2, uncontrolled 2015   Has poor vision.  Family not aware of A1C or  what sugars run.  Believe not well controlled.  Not able to monitor due to cost of monitoring equipment   Hypertension 2015    No past surgical history on file.  Family History  Problem Relation Age of Onset   Diabetes Mother    Diabetes Mellitus II Neg Hx     Social History:  reports that she has never smoked. She has never used smokeless tobacco. She reports that she does not drink alcohol and does not use drugs.  Review of Systems:  Last diabetic eye exam date 9/23 She has proliferative retinopathy and has had surgery for complications including vitrectomy  Last foot exam date:11/22  Symptoms of neuropathy: Has numbness in feet  Hypertension:   Treatment includes olmesartan 40 mg daily, she is not getting a refill for carvedilol and she does not know where to get it from Apparently has been followed by hypertension clinic at Dr. Allison Quarry office  BP Readings from Last 3 Encounters:  05/20/22 (!) 164/86  04/22/22 (!) 185/82  03/14/22 (!) 176/82   She has had variable renal function since 2021 with creatinine as high as 2 She is seeing a nephrologist periodically, Dr. [name]  Lab Results  Component Value Date   CREATININE 1.51 (H) 04/15/2022   CREATININE 1.52 (H) 11/29/2021   CREATININE 1.76 (H) 11/13/2021     Lipid management: She has been treated with Lipitor 40 mg daily, prescribed by her PCP, previously was on Crestor    Lab Results  Component Value Date   CHOL 157 06/19/2021   CHOL 222 (H) 03/28/2019   Lab Results  Component Value Date   HDL 41 06/19/2021   HDL 40 03/28/2019   Lab Results  Component Value Date   LDLCALC 80 06/19/2021   LDLCALC 104 (H) 03/28/2019   Lab Results  Component Value Date   TRIG 218 (H) 06/19/2021   TRIG 389 (H) 03/28/2019   Lab Results  Component Value Date   CHOLHDL 3.8 06/19/2021   Lab Results  Component Value Date   LDLDIRECT 74.0 05/15/2022     Examination:   BP (!) 164/86   Pulse 83   Ht _0  (1.6 m)    Wt 149 lb 6.4 oz (67.8 kg)   LMP  (LMP Unknown)   SpO2 99%   BMI 26.47 kg/m   Body mass index is 26.47 kg/m.   No ankle edema present  ASSESSMENT/ PLAN:    Diabetes type 2 non-insulin-dependent:   Current regimen: Farxiga 10 mg daily and metformin 2 g daily  See history of present  illness for detailed discussion of current diabetes management, blood sugar patterns and problems identified  A1c is 7.4 compared to 7.3 Fructosamine 286  She does also have anemia and renal insufficiency  Appears to have fairly good blood sugar control however blood sugar data is not available and has minimal monitoring at home Fasting glucose mildly increased at 140 She did not reduce her metformin to 1000 mg on the last visit as directed  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, most recent GFR is 33  Recommendations:  Need to have her follow the diet given by dietitian and also likely needs increase caloric intake without carbohydrate increase She will need to discuss her weight loss with PCP Since she has significant renal insufficiency she will reduce her metformin to 500 mg twice daily instead of 2000 Start again blood sugars consistently and keep a record or bring the monitor for review Make sure her test trips are not outdated Continue on FARXIGA 10 mg daily She will have her lipids monitored from her PCP Follow-up with cardiology or nephrology regarding inadequate blood pressure control  Flu vaccine given Follow-up in 4 months  There are no Patient Instructions on file for this visit.  Total visit time for evaluation and management and counseling = 30 minutes  Elayne Snare 05/20/2022, 8:13 AM

## 2022-05-26 ENCOUNTER — Other Ambulatory Visit: Payer: Self-pay | Admitting: Hematology

## 2022-05-27 DIAGNOSIS — Z961 Presence of intraocular lens: Secondary | ICD-10-CM | POA: Insufficient documentation

## 2022-06-09 ENCOUNTER — Ambulatory Visit (HOSPITAL_COMMUNITY): Admission: RE | Admit: 2022-06-09 | Payer: Medicaid Other | Source: Ambulatory Visit

## 2022-06-09 ENCOUNTER — Encounter (HOSPITAL_COMMUNITY): Payer: Medicaid Other

## 2022-06-16 ENCOUNTER — Inpatient Hospital Stay: Payer: Medicaid Other | Admitting: Hematology

## 2022-06-20 ENCOUNTER — Encounter (HOSPITAL_COMMUNITY)
Admission: RE | Admit: 2022-06-20 | Discharge: 2022-06-20 | Disposition: A | Discharge status: Home / Self Care | Payer: Medicaid Other | Source: Ambulatory Visit | Attending: Hematology | Admitting: Hematology

## 2022-06-20 DIAGNOSIS — C9000 Multiple myeloma not having achieved remission: Secondary | ICD-10-CM | POA: Insufficient documentation

## 2022-06-20 DIAGNOSIS — M542 Cervicalgia: Secondary | ICD-10-CM | POA: Insufficient documentation

## 2022-06-20 LAB — GLUCOSE, CAPILLARY: Glucose-Capillary: 148 mg/dL — ABNORMAL HIGH (ref 70–99)

## 2022-06-20 MED ORDER — FLUDEOXYGLUCOSE F - 18 (FDG) INJECTION
7.4000 | Freq: Once | INTRAVENOUS | Status: AC
  Administered 2022-06-20: 7.4 via INTRAVENOUS

## 2022-06-20 NOTE — Progress Notes (Incomplete)
Pt complains of neck pain radiating down into left arm. Eval for Hnp. Pt to have a PET scan post MRI.

## 2022-06-24 ENCOUNTER — Other Ambulatory Visit: Payer: Self-pay | Admitting: Family

## 2022-06-24 DIAGNOSIS — E119 Type 2 diabetes mellitus without complications: Secondary | ICD-10-CM

## 2022-06-25 ENCOUNTER — Other Ambulatory Visit: Payer: Self-pay | Admitting: Endocrinology

## 2022-06-25 ENCOUNTER — Inpatient Hospital Stay: Payer: Medicaid Other | Attending: Hematology | Admitting: Hematology

## 2022-06-25 DIAGNOSIS — M542 Cervicalgia: Secondary | ICD-10-CM

## 2022-06-25 DIAGNOSIS — D649 Anemia, unspecified: Secondary | ICD-10-CM | POA: Diagnosis not present

## 2022-06-25 DIAGNOSIS — C9000 Multiple myeloma not having achieved remission: Secondary | ICD-10-CM

## 2022-06-25 DIAGNOSIS — D472 Monoclonal gammopathy: Secondary | ICD-10-CM | POA: Insufficient documentation

## 2022-06-25 MED ORDER — GABAPENTIN 300 MG PO CAPS: 300.0000 mg | ORAL_CAPSULE | Freq: Every day | ORAL | 5 refills | Status: DC

## 2022-06-25 NOTE — Progress Notes (Signed)
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  She is accompanied by her daughter.  I connected with Jennifer Molina on 06/25/22 at  9:00 AM EST by telephone visit and verified that I am speaking with the correct person using two identifiers.   I discussed the limitations, risks, security and privacy concerns of performing an evaluation and management service by telemedicine and the availability of in-person appointments. I also discussed with the patient that there may be a patient responsible charge related to this service. The patient expressed understanding and agreed to proceed.   Other persons participating in the visit and their role in the encounter: Her daughter.   Patient's location: Home  Provider's location: [facility]  Chief Complaint: MGUS   Patient was last seen by me on 04/22/2022 and complained of pain throughout left neck, left arm, and left leg.   Her daughter was present with the patient when I called for an phone visit today. Her daughter notes she is doing well overall, but has mild fatigue. Patient denies neck, arm, and leg pain since our last visit.   Her daughter notes that the patient needs refill for Gabapentin 300 mg. Patient notes that the Gabapentin has been helping her with her symptoms.  Patient is doing well overall without any new  medical concerns.    MEDICAL HISTORY:  Past Medical History:  Diagnosis Date   Dementia ([place]) 03/28/2019   DM (diabetes mellitus), type 2, uncontrolled 2015   Has poor vision.  Family not aware of A1C or what sugars run.  Believe not well controlled.  Not able to monitor due to cost of monitoring equipment   Hypertension 2015    SURGICAL HISTORY: No past surgical history on file.  SOCIAL HISTORY: Social History   Socioeconomic History   Marital status: Married    Spouse name: Jennifer Molina   Number of children: 7   Years of education: 3   Highest education level: 3rd grade  Occupational History   Occupation: Retired from farming in [place]  Tobacco Use   Smoking status: Never   Smokeless tobacco: Never  Vaping Use   Vaping Use: Never used  Substance and Sexual Activity   Alcohol use: Never   Drug use: Never   Sexual activity: Not on file  Other Topics Concern   Not on file  Social History Narrative   [place] in [place]   Lives at home with husband   Her 2 daughters, 2 sons and their families   All together, 83 people in the home   Social Determinants of Health   Financial Resource Strain: Not on file  Food Insecurity: Not on file  Transportation Needs: Not on file  Physical Activity: Not on file  Stress: Not on file  Social Connections: Not on file  Intimate Partner Violence: Not on file    FAMILY HISTORY: Family History  Problem Relation Age of Onset  Diabetes Mother    Diabetes Mellitus II Neg Hx     ALLERGIES:  has No Known Allergies.  MEDICATIONS:  Current Outpatient Medications  Medication Sig Dispense Refill   acetaminophen (TYLENOL) 500 MG tablet Take 1,000 mg by mouth every 6 (six) hours as needed for mild pain or fever.     amLODipine-olmesartan (AZOR) 10-40 MG tablet Take 1 tablet by mouth daily.     ASPIRIN LOW DOSE 81 MG tablet TAKE 1 TABLET(81 MG) BY MOUTH DAILY 30 tablet 2   atorvastatin (LIPITOR) 40 MG tablet Take 1 tablet by mouth  once daily 90 tablet 0   Blood Glucose Monitoring Suppl (TRUE METRIX METER) w/Device KIT Use as directed 1 kit 0   carvedilol (COREG) 25 MG tablet Take 1 tablet (25 mg total) by mouth 2 (two) times daily. (Patient not taking: Reported on 05/20/2022) 180 tablet 3   chlorthalidone (HYGROTON) 50 MG tablet Take 1 tablet (50 mg total) by mouth daily. 90 tablet 3   FARXIGA 10 MG TABS tablet TAKE 1 TABLET(10 MG) BY MOUTH DAILY BEFORE BREAKFAST 30 tablet 3   FEROSUL 325 (65 Fe) MG tablet TAKE 1 TABLET(325 MG) BY MOUTH DAILY 30 tablet 5   gabapentin (NEURONTIN) 300 MG capsule Take 1 capsule (300 mg total) by mouth at bedtime. 30 capsule 5   glucose blood (ACCU-CHEK GUIDE) test strip 1 each by Other route daily. And lancets 1/day 100 each 3   glucose blood (TRUE METRIX BLOOD GLUCOSE TEST) test strip Use as instructed 100 each 12   metFORMIN (GLUCOPHAGE-XR) 500 MG 24 hr tablet Take 4 tablets (2,000 mg total) by mouth daily. 360 tablet 3   TRUEplus Lancets 28G MISC Use as directed 100 each 4   No current facility-administered medications for this visit.    REVIEW OF SYSTEMS:   .10 Point review of Systems was done is negative except as noted above.  PHYSICAL EXAMINATION: Telemedicine visit  LABORATORY DATA:  I have reviewed the data as listed  .    Latest Ref Rng & Units 04/15/2022    9:27 AM 11/29/2021    9:08 AM 08/30/2021   10:10 AM  CBC  WBC 4.0 - 10.5 K/uL 5.8  5.5  7.0   Hemoglobin 12.0 - 15.0 g/dL 9.3  8.9  9.1   Hematocrit 36.0 - 46.0 % 28.3  27.8  28.2   Platelets 150 - 400 K/uL 236  214  264    CBC    Component Value Date/Time   WBC 5.8 04/15/2022 0927   WBC 7.8 10/02/2020 1504   RBC 3.45 (L) 04/15/2022 0927   HGB 9.3 (L) 04/15/2022 0927   HGB 12.2 03/28/2019 1143   HCT 28.3 (L) 04/15/2022 0927   HCT 37.4 03/28/2019 1143   PLT 236 04/15/2022 0927   PLT 295 03/28/2019 1143   MCV 82.0 04/15/2022 0927   MCV 83 03/28/2019 1143   MCH 27.0 04/15/2022 0927   MCHC 32.9 04/15/2022  0927   RDW 14.1 04/15/2022 0927   RDW 14.4 03/28/2019 1143   LYMPHSABS 1.5 04/15/2022 0927   LYMPHSABS 2.5 03/28/2019 1143   MONOABS 0.5 04/15/2022 0927   EOSABS 0.2 04/15/2022 0927   EOSABS 0.1 03/28/2019 1143   BASOSABS 0.0 04/15/2022 0927   BASOSABS 0.1 03/28/2019 1143       Latest Ref Rng & Units 05/15/2022    8:55 AM 04/15/2022    9:27 AM 11/29/2021    9:08 AM  CMP  Glucose 70 -  99 mg/dL 140  168  242   BUN 8 - 23 mg/dL  29  35   Creatinine 0.44 - 1.00 mg/dL  1.51  1.52   Sodium 135 - 145 mmol/L  138  137   Potassium 3.5 - 5.1 mmol/L  4.6  4.6   Chloride 98 - 111 mmol/L  107  108   CO2 22 - 32 mmol/L  24  24   Calcium 8.9 - 10.3 mg/dL  9.6  9.1   Total Protein 6.5 - 8.1 g/dL  8.5  8.2   Total Bilirubin 0.3 - 1.2 mg/dL  0.3  0.4   Alkaline Phos 38 - 126 U/L  43  37   AST 15 - 41 U/L  15  16   ALT 0 - 44 U/L  12  24    . Lab Results  Component Value Date   IRON 60 04/15/2022   TIBC 293 04/15/2022   IRONPCTSAT 21 04/15/2022   (Iron and TIBC)  Lab Results  Component Value Date   FERRITIN 489 (H) 08/30/2021     09/24/2020 Surgical Pathology DIAGNOSIS:   BONE MARROW, ASPIRATE, CLOT, CORE:  -Slightly hypercellular bone marrow for age with plasma cell neoplasm  -See comment   PERIPHERAL BLOOD:  -Normocytic-normochromic anemia   COMMENT:   The bone marrow is hypercellular for age with slight increase in plasma  cells representing 8% of all cells in the aspirate with lack of large  aggregates or sheets.  Immunohistochemical stains highlight the  increased plasma cell component in the clot/biopsy sections correlating  with the aspirate.  The plasma cells display kappa light chain  restriction consistent with plasma cell neoplasm.  Correlation with  cytogenetic and FISH studies recommended.   09/24/2020 Molecular Pathology    09/24/2020 Cytogenetics Report    RADIOGRAPHIC STUDIES: I have personally reviewed the radiological images as listed and agreed  with the findings in the report. MR Cervical Spine Wo Contrast  Result Date: 06/23/2022 CLINICAL DATA:  Neck pain, left upper extremity pain EXAM: MRI CERVICAL SPINE WITHOUT CONTRAST TECHNIQUE: Multiplanar, multisequence MR imaging of the cervical spine was performed. No intravenous contrast was administered. COMPARISON:  None Available. FINDINGS: Alignment: 2 mm anterolisthesis of C4 on C5. Loss of the normal cervical lordosis with straightening. Vertebrae: No acute fracture, evidence of discitis, or aggressive bone lesion. Cord: Normal signal and morphology. Posterior Fossa, vertebral arteries, paraspinal tissues: Posterior fossa demonstrates no focal abnormality. Vertebral artery flow voids are maintained. Paraspinal soft tissues are unremarkable. Disc levels: Discs: Mild degenerative disease with disc height loss at C5-6 and C6-7. C2-3: No significant disc bulge. No neural foraminal stenosis. No central canal stenosis. C3-4: Small central disc protrusion. Moderate left and mild right facet arthropathy. Moderate left foraminal stenosis. Mild right foraminal stenosis. No spinal stenosis. C4-5: Mild broad-based disc bulge. Mild right facet arthropathy. Severe right foraminal stenosis. No left foraminal stenosis. No spinal stenosis. C5-6: Small right paracentral disc protrusion. Bilateral uncovertebral degenerative changes. Moderate-severe bilateral foraminal stenosis. Mild spinal stenosis. C6-7: Broad-based disc bulge. Bilateral uncovertebral degenerative changes. Severe right and moderate-severe left foraminal stenosis. Moderate spinal stenosis. C7-T1: No disc protrusion. Moderate left facet arthropathy. Severe left foraminal stenosis. No right foraminal stenosis. No spinal stenosis. IMPRESSION: 1. Cervical spine spondylosis as described above. 2. No acute osseous injury of the cervical spine. Electronically Signed   By: Kathreen Devoid M.D.   On: 06/23/2022 10:45   NM PET Image Restage (PS) Whole Body  Result  Date: 06/23/2022 CLINICAL DATA:  Subsequent treatment strategy for multiple myeloma. EXAM: NUCLEAR MEDICINE PET WHOLE BODY TECHNIQUE: 7.4 mCi F-18 FDG was injected intravenously. Full-ring PET imaging was performed from the head to foot after the radiotracer. CT data was obtained and used for attenuation correction and anatomic localization. Fasting blood glucose: 148 mg/dl COMPARISON:  PET-CT September 28, 2020 FINDINGS: Mediastinal blood pool activity: SUV max 2.1 HEAD/NECK: No hypermetabolic cervical adenopathy. Incidental CT findings: FDG avid nodular mucosal thickening of the right maxillary sinus for instance on image 50/4 with a max SUV of 6.1. CHEST: No hypermetabolic pulmonary nodules or masses. No hypermetabolic thoracic adenopathy. Incidental CT findings: Similar interstitial reticulations, ground-glass attenuation and volume loss with bronchiectasis/bronchiolectasis throughout but most notably in the lower lobes likely postinfectious or inflammatory sequela. ABDOMEN/PELVIS: No abnormal hypermetabolic activity within the liver, pancreas, adrenal glands, or spleen. No hypermetabolic lymph nodes in the abdomen or pelvis. Incidental CT findings: none SKELETON: No focal hypermetabolic activity to suggest skeletal metastasis. Incidental CT findings: none EXTREMITIES: No abnormal hypermetabolic activity in the lower extremities. Incidental CT findings: none IMPRESSION: 1. No FDG avid bone lesions to suggest active myeloma. 2. No FDG avid adenopathy or solid organ metastasis. 3. FDG avid nodular mucosal thickening of the right maxillary sinus, nonspecific but likely infectious or inflammatory suggest correlation for sinusitis. Electronically Signed   By: Dahlia Bailiff M.D.   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 Iron levels adequate. Will likely need consideration of erythropoietin if hemoglobin remains less than 9 and hypertension is well controlled.  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 Unlikely to be related to her monoclonal paraproteinemia-follows with Dr. Trudi Ida completely rule out possibility of light chain deposition disease or AL amyloidosis. We will defer to nephrology regarding need for kidney biopsy to rule out these possibilities if indicated.  PLAN: -Patient's labs from 04/15/2022 were discussed in detail with her and her daughter. Labs showed WBC count of 5.8 K, hemoglobin of 9.3 K, and platelets of 236. Stable CMP. -Patient has no clinical or lab findings suggestive of progression to multiple myeloma at this time. -Discussed her neck MRI, which showed arthritis and disk problems. It did not show any cancer.  -Discussed her whole body PET scan, which showed no evidence of active myeloma.  -Recommended Spine Specialist, if needed for her neck pain. PCP could refer to spine specialist if needed.  FOLLOW-UP: RTC with Dr Irene Limbo with labs in 4 months  The total time spent in the appointment was 20 minutes* .  All of the patient's questions were answered with apparent satisfaction. The patient knows to call the clinic with any problems, questions or concerns.   Zettie Cooley, am acting as a scribe for Sullivan Lone, MD.  Sullivan Lone MD [place] AAHIVMS [facility] Hematology/Oncology Physician [facility]  .*Total Encounter Time as defined by the Centers for Medicare and Medicaid Services includes, in addition to the face-to-face time of a patient visit (documented in the note above) non-face-to-face time: obtaining and reviewing outside history, ordering and reviewing medications, tests or procedures, care coordination (communications with other health care professionals or caregivers) and documentation in the  medical record.

## 2022-06-25 NOTE — Telephone Encounter (Signed)
Requested medication (s) are due for refill today:   No  Requested medication (s) are on the active medication list:   No  Future visit scheduled:   No   Last ordered: 05/20/2022 discontinued by Dr. Dwyane Dee  Returned because discontinued and not on medication list.     Requested Prescriptions  Pending Prescriptions Disp Refills   glimepiride (AMARYL) 1 MG tablet [Pharmacy Med Name: GLIMEPIRIDE '1MG'$  TABLETS] 180 tablet 0    Sig: TAKE 1 TABLET(1 MG) BY MOUTH TWICE DAILY WITH A MEAL     Endocrinology:  Diabetes - Sulfonylureas Failed - 06/24/2022 10:55 PM      Failed - Cr in normal range and within 360 days    Creatinine  Date Value Ref Range Status  04/15/2022 1.51 (H) 0.44 - 1.00 mg/dL Final         Failed - Valid encounter within last 6 months    Recent Outpatient Visits           10 months ago Type 2 diabetes mellitus without complication, without long-term current use of insulin ([place])   Primary Care at [facility], Amy J, NP   11 months ago Type 2 diabetes mellitus without complication, without long-term current use of insulin [facility])   [facility] at [facility], Amy J, NP   12 months ago Essential hypertension   Primary Care at [facility], [place], NP   1 year ago Medicare annual wellness visit, initial   Primary Care at [facility], Amy J, NP   1 year ago Encounter to establish care   Primary Care at [facility], Amy J, NP              Passed - HBA1C is between 0 and 7.9 and within 180 days    Hgb A1c MFr Bld  Date Value Ref Range Status  05/15/2022 7.4 (H) 4.6 - 6.5 % Final    Comment:    Glycemic Control Guidelines for People with Diabetes:Non Diabetic:  <6%Goal of Therapy: <7%Additional Action Suggested:  >8%

## 2022-07-01 ENCOUNTER — Other Ambulatory Visit: Payer: Self-pay | Admitting: Hematology

## 2022-07-04 ENCOUNTER — Other Ambulatory Visit: Payer: Self-pay | Admitting: Family

## 2022-07-04 DIAGNOSIS — E785 Hyperlipidemia, unspecified: Secondary | ICD-10-CM

## 2022-07-04 NOTE — Telephone Encounter (Signed)
Courtesy order. Please call patient to make aware overdue for office visit. Last visit 08/28/2021.

## 2022-07-22 ENCOUNTER — Other Ambulatory Visit: Payer: Self-pay | Admitting: Family

## 2022-08-01 ENCOUNTER — Other Ambulatory Visit: Payer: Self-pay | Admitting: Hematology

## 2022-08-01 ENCOUNTER — Other Ambulatory Visit: Payer: Self-pay

## 2022-08-01 MED ORDER — FERROUS SULFATE 325 (65 FE) MG PO TABS: ORAL_TABLET | ORAL | 5 refills | Status: DC

## 2022-08-16 ENCOUNTER — Other Ambulatory Visit: Payer: Self-pay | Admitting: Family

## 2022-08-16 DIAGNOSIS — E785 Hyperlipidemia, unspecified: Secondary | ICD-10-CM

## 2022-09-15 ENCOUNTER — Other Ambulatory Visit: Payer: Self-pay | Admitting: Family

## 2022-09-15 DIAGNOSIS — E785 Hyperlipidemia, unspecified: Secondary | ICD-10-CM

## 2022-09-23 ENCOUNTER — Other Ambulatory Visit: Payer: Self-pay | Admitting: Endocrinology

## 2022-09-23 ENCOUNTER — Other Ambulatory Visit (INDEPENDENT_AMBULATORY_CARE_PROVIDER_SITE_OTHER): Payer: Medicaid Other

## 2022-09-23 ENCOUNTER — Other Ambulatory Visit: Payer: Medicaid Other

## 2022-09-23 DIAGNOSIS — R7309 Other abnormal glucose: Secondary | ICD-10-CM

## 2022-09-23 DIAGNOSIS — E1165 Type 2 diabetes mellitus with hyperglycemia: Secondary | ICD-10-CM

## 2022-09-23 LAB — MICROALBUMIN / CREATININE URINE RATIO
Creatinine,U: 54 mg/dL
Microalb Creat Ratio: 21.8 mg/g (ref 0.0–30.0)
Microalb, Ur: 11.8 mg/dL — ABNORMAL HIGH (ref 0.0–1.9)

## 2022-09-23 LAB — BASIC METABOLIC PANEL
BUN: 29 mg/dL — ABNORMAL HIGH (ref 6–23)
CO2: 23 mEq/L (ref 19–32)
Calcium: 9.2 mg/dL (ref 8.4–10.5)
Chloride: 104 mEq/L (ref 96–112)
Creatinine, Ser: 1.43 mg/dL — ABNORMAL HIGH (ref 0.40–1.20)
GFR: 37.71 mL/min — ABNORMAL LOW (ref >60.00)
Glucose, Bld: 200 mg/dL — ABNORMAL HIGH (ref 70–99)
Potassium: 4.6 mEq/L (ref 3.5–5.1)
Sodium: 137 mEq/L (ref 135–145)

## 2022-09-24 LAB — HEMOGLOBIN A1C
Est. average glucose Bld gHb Est-mCnc: 217 mg/dL
Hgb A1c MFr Bld: 9.2 % — ABNORMAL HIGH (ref 4.8–5.6)

## 2022-09-25 ENCOUNTER — Encounter: Payer: Self-pay | Admitting: Endocrinology

## 2022-09-25 ENCOUNTER — Ambulatory Visit (INDEPENDENT_AMBULATORY_CARE_PROVIDER_SITE_OTHER): Payer: Medicaid Other | Admitting: Endocrinology

## 2022-09-25 ENCOUNTER — Telehealth: Payer: Self-pay

## 2022-09-25 VITALS — BP 144/68 | HR 99 | Ht 63.0 in | Wt 149.6 lb

## 2022-09-25 DIAGNOSIS — N1832 Chronic kidney disease, stage 3b: Secondary | ICD-10-CM | POA: Diagnosis not present

## 2022-09-25 DIAGNOSIS — E1165 Type 2 diabetes mellitus with hyperglycemia: Secondary | ICD-10-CM | POA: Diagnosis not present

## 2022-09-25 DIAGNOSIS — E1122 Type 2 diabetes mellitus with diabetic chronic kidney disease: Secondary | ICD-10-CM | POA: Diagnosis not present

## 2022-09-25 DIAGNOSIS — E782 Mixed hyperlipidemia: Secondary | ICD-10-CM

## 2022-09-25 MED ORDER — RYBELSUS 7 MG PO TABS: 1.0000 | ORAL_TABLET | Freq: Every day | ORAL | 3 refills | Status: DC

## 2022-09-25 MED ORDER — ACCU-CHEK GUIDE VI STRP: 1.0000 | ORAL_STRIP | Freq: Every day | 3 refills | Status: AC

## 2022-09-25 MED ORDER — ACCU-CHEK GUIDE ME W/DEVICE KIT: PACK | 0 refills | Status: AC

## 2022-09-25 NOTE — Patient Instructions (Addendum)
Rybelsus improves blood sugar control as well as can help with weight loss and reduces cardiovascular events.  Need to take the capsules on empty stomach 30 minutes before breakfast with 4 ounces of water daily.  You may feel more fullness at mealtimes and try to keep portions at meals smaller Some people may have nausea or even vomiting that may occur in the first few days; usually the symptoms go away with time.  Please call if nausea or vomiting does not improve within 2 weeks   Take 3 mg for 1 month then 7 mg daily  Stop Glimeperide if taking, only 1 metformin in am  Check blood sugars on waking up 2-3 days a week  Also check blood sugars about 2 hours after meals and do this after different meals by rotation  Recommended blood sugar levels on waking up are 90-130 and about 2 hours after meal is 130-160  Please bring your blood sugar monitor to each visit, thank you  Walk daily

## 2022-09-25 NOTE — Telephone Encounter (Signed)
-----   Message from Elayne Snare, MD sent at 09/25/2022  2:57 PM EST ----- Regarding: New meter Please send prescription for new Accu-Chek guide meter with test trips checking once a day

## 2022-09-25 NOTE — Progress Notes (Signed)
Patient ID: Jennifer Molina, female   DOB: [birth year], 67 y.o.   MRN: [id number]           Reason for Appointment: Type II Diabetes follow-up   History of Present Illness   Diagnosis date: 2019  Previous history:  Non-insulin hypoglycemic drugs previously used: Metformin, Amaryl Has not been on insulin except in the hospital in 2020  A1c range in the last few years is: 7.3-9.1  Recent history:     Non-insulin hypoglycemic drugs: Metformin 2g daily,   Farxiga 10 mg daily       Side effects from medications: None  Current self management, blood sugar patterns and problems identified:  A1c is now 9.3, was 7.4  Patient is unable to give a history and information was obtained from her son Although she was encouraged to start checking her blood sugars more often and bring her meter she has not been any monitoring and does not like to do fingersticks She had previously been on glimepiride, unclear whether she is still taking this She has also not done any walking lately which she wants Her appetite is better than previously although her weight is about the same  She thinks she is taking her metformin and Farxiga as prescribed Fasting lab glucose was 200 Renal function is stable  Exercise: not walking  Diet management: Usually has 3 meals a day Has more tofu for protein than meat     Hypoglycemia:  none    Glucometer:       True Metrix  Blood Glucose readings not done  Dietician visit: Most recent: 7/23  Weight control:  Wt Readings from Last 3 Encounters:  09/25/22 149 lb 9.6 oz (67.9 kg)  05/20/22 149 lb 6.4 oz (67.8 kg)  05/02/22 153 lb (69.4 kg)            Diabetes labs:  Lab Results  Component Value Date   HGBA1C 9.2 (H) 09/23/2022   HGBA1C 7.4 (H) 05/15/2022   HGBA1C 7.3 (A) 12/12/2021   Lab Results  Component Value Date   MICROALBUR 11.8 (H) 09/23/2022   LDLCALC 80 06/19/2021   CREATININE 1.43 (H) 09/23/2022   Lab Results  Component Value Date    FRUCTOSAMINE 286 (H) 05/15/2022   Lab Results  Component Value Date   HGB 9.3 (L) 04/15/2022     Allergies as of 09/25/2022   No Known Allergies      Medication List        Accurate as of September 25, 2022 11:34 AM. If you have any questions, ask your nurse or doctor.          acetaminophen 500 MG tablet Commonly known as: TYLENOL Take 1,000 mg by mouth every 6 (six) hours as needed for mild pain or fever.   amLODipine-olmesartan 10-40 MG tablet Commonly known as: AZOR Take 1 tablet by mouth daily.   Aspirin Low Dose 81 MG tablet Generic drug: aspirin EC TAKE 1 TABLET(81 MG) BY MOUTH DAILY   atorvastatin 40 MG tablet Commonly known as: LIPITOR Take 1 tablet by mouth once daily   carvedilol 25 MG tablet Commonly known as: COREG Take 1 tablet (25 mg total) by mouth 2 (two) times daily.   chlorthalidone 50 MG tablet Commonly known as: HYGROTON Take 1 tablet (50 mg total) by mouth daily.   Farxiga 10 MG Tabs tablet Generic drug: dapagliflozin propanediol TAKE 1 TABLET(10 MG) BY MOUTH DAILY BEFORE BREAKFAST   ferrous sulfate 325 (65 FE) MG tablet Commonly  known as: FeroSul TAKE 1 TABLET(325 MG) BY MOUTH DAILY   gabapentin 300 MG capsule Commonly known as: NEURONTIN Take 1 capsule (300 mg total) by mouth at bedtime.   metFORMIN 500 MG 24 hr tablet Commonly known as: GLUCOPHAGE-XR Take 4 tablets (2,000 mg total) by mouth daily.   Rybelsus 7 MG Tabs Generic drug: Semaglutide Take 1 tablet (7 mg total) by mouth daily before breakfast. Take 30 minutes before breakfast with 4 oz. water Started by: Elayne Snare, MD   True Metrix Blood Glucose Test test strip Generic drug: glucose blood Use as instructed   Accu-Chek Guide test strip Generic drug: glucose blood 1 each by Other route daily. And lancets 1/day   True Metrix Meter w/Device Kit Use as directed   TRUEplus Lancets 28G Misc Use as directed        Allergies: No Known Allergies  Past Medical  History:  Diagnosis Date   Dementia ([place]) 03/28/2019   DM (diabetes mellitus), type 2, uncontrolled 2015   Has poor vision.  Family not aware of A1C or what sugars run.  Believe not well controlled.  Not able to monitor due to cost of monitoring equipment   Hypertension 2015    No past surgical history on file.  Family History  Problem Relation Age of Onset   Diabetes Mother    Diabetes Mellitus II Neg Hx     Social History:  reports that she has never smoked. She has never used smokeless tobacco. She reports that she does not drink alcohol and does not use drugs.  Review of Systems:  Last diabetic eye exam date 9/23 She has proliferative retinopathy and has had surgery for complications including vitrectomy  Last foot exam date:11/22  Symptoms of neuropathy: Has numbness in feet  Hypertension:   Treatment includes olmesartan 40 mg daily,  followed by hypertension clinic at Dr. Allison Quarry office  BP Readings from Last 3 Encounters:  09/25/22 (!) 144/68  05/20/22 (!) 164/86  04/22/22 (!) 185/82   She has had variable renal function since 2021 with creatinine as high as 2 She is seeing a nephrologist periodically, Dr. [name]  Lab Results  Component Value Date   CREATININE 1.43 (H) 09/23/2022   CREATININE 1.51 (H) 04/15/2022   CREATININE 1.52 (H) 11/29/2021     Lipid management: She has been treated with Lipitor 40 mg daily, prescribed by her PCP, previously was on Crestor    Lab Results  Component Value Date   CHOL 157 06/19/2021   CHOL 222 (H) 03/28/2019   Lab Results  Component Value Date   HDL 41 06/19/2021   HDL 40 03/28/2019   Lab Results  Component Value Date   LDLCALC 80 06/19/2021   LDLCALC 104 (H) 03/28/2019   Lab Results  Component Value Date   TRIG 218 (H) 06/19/2021   TRIG 389 (H) 03/28/2019   Lab Results  Component Value Date   CHOLHDL 3.8 06/19/2021   Lab Results  Component Value Date   LDLDIRECT 74.0 05/15/2022      Examination:   BP (!) 144/68 (BP Location: Left Arm, Patient Position: Sitting, Cuff Size: Normal)   Pulse 99   Ht '5\' 3"'$  (1.6 m)   Wt 149 lb 9.6 oz (67.9 kg)   LMP  (LMP Unknown)   SpO2 96%   BMI 26.50 kg/m   Body mass index is 26.5 kg/m.   No ankle edema present  ASSESSMENT/ PLAN:    Diabetes type 2 non-insulin-dependent:  Current regimen: Farxiga 10 mg daily and metformin 2 g daily  See history of present illness for detailed discussion of current diabetes management, blood sugar patterns and problems identified  A1c is 9.2 and significantly higher  She may be having higher blood sugars because of her regaining her appetite and has had some weight gain She is poorly motivated to check her blood sugars been difficult to know what her blood sugar patterns are but her fasting glucose is 200 Likely is not following the diet previously discussed with her and has not been motivated to exercise  She does also have anemia and renal insufficiency Metformin dose is limited by her renal function   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, most recent GFR is 38  Hypertension: Fair control  Recommendations:  Need to have her follow the diet given by dietitian  Start walking regularly She needs to start using an Accu-Chek meter which can be downloaded Discussed importance of checking blood sugars both fasting and after meals and her daughter will help her do this She will benefit from a GLP-1 drug and for simplicity can start her on Rybelsus 3 mg Discussed with the patient the nature of GLP-1 drugs, the actions on insulin secretion, slowing stomach emptying, reduction of appetite and reduced liver glucose production Explained that Rybelsus improves blood sugar control as well as produces weight loss and reduces cardiovascular events. Explained possible side effects especially nausea and vomiting that may occur in the first few days; usually side effects improve with time.   Patient to call if nausea or vomiting does not improve within 2 weeks Instructed to take the capsules on empty stomach 30 minutes before breakfast with 4 ounces of water daily.  Patient education material given Continue on FARXIGA 10 mg daily She will have her lipids monitored from her PCP Follow-up with cardiology or nephrology regarding inadequate blood pressure control  Follow-up in 2 months  Patient Instructions  Rybelsus improves blood sugar control as well as can help with weight loss and reduces cardiovascular events.  Need to take the capsules on empty stomach 30 minutes before breakfast with 4 ounces of water daily.  You may feel more fullness at mealtimes and try to keep portions at meals smaller Some people may have nausea or even vomiting that may occur in the first few days; usually the symptoms go away with time.  Please call if nausea or vomiting does not improve within 2 weeks   Take 3 mg for 1 month then 7 mg daily  Stop Glimeperide if taking, only 1 metformin in am  Check blood sugars on waking up 2-3 days a week  Also check blood sugars about 2 hours after meals and do this after different meals by rotation  Recommended blood sugar levels on waking up are 90-130 and about 2 hours after meal is 130-160  Please bring your blood sugar monitor to each visit, thank you  Walk daily  Total visit time for evaluation and management and counseling = 30 minutes  Keylor Rands 09/25/2022, 11:34 AM

## 2022-10-01 ENCOUNTER — Encounter: Payer: Self-pay | Admitting: Internal Medicine

## 2022-10-01 ENCOUNTER — Ambulatory Visit: Payer: Medicaid Other | Attending: Internal Medicine | Admitting: Internal Medicine

## 2022-10-01 VITALS — BP 144/62 | HR 94 | Ht 63.0 in | Wt 151.4 lb

## 2022-10-01 DIAGNOSIS — I10 Essential (primary) hypertension: Secondary | ICD-10-CM | POA: Insufficient documentation

## 2022-10-01 MED ORDER — CARVEDILOL 6.25 MG PO TABS: 6.2500 mg | ORAL_TABLET | Freq: Two times a day (BID) | ORAL | 3 refills | Status: DC

## 2022-10-01 NOTE — Patient Instructions (Signed)
Medication Instructions:   Change Carvedilol to 6.25 mg twice a day - new prescription sent to the pharmacy    Stop taking 25 mg Carvedilol  *If you need a refill on your cardiac medications before your next appointment, please call your pharmacy*   Lab Work: Not needed .   Testing/Procedures:  Not needed  Follow-Up: At [facility], you and your health needs are our priority.  As part of our continuing mission to provide you with exceptional heart care, we have created designated Provider Care Teams.  These Care Teams include your primary Cardiologist (physician) and Advanced Practice Providers (APPs -  Physician Assistants and Nurse Practitioners) who all work together to provide you with the care you need, when you need it.     Your next appointment:   12 month(s)  The format for your next appointment:   In Person  Provider:   [name], MD

## 2022-10-01 NOTE — Progress Notes (Signed)
Cardiology Office Note:    Date:  10/01/2022   ID:  Tyauna Edmunds, DOB [birth year], MRN [id number]  PCP:  [name], NP   [facility] Providers Cardiologist:  [name], MD     Referring MD: [name], NP   No chief complaint on file. Hypertension management  History of Present Illness:    Tricha Ziebell is a 67 y.o. female with a hx of HTN, T2DM, HLD, referral for hypertension management, interview with an interpreter  She was seen on 06/25/2023 by her primary provider. Her BP was 197/103 with prior 180/90 and 173/95. She was asymptomatic. She was given clonidine. Her Bps have been in the 200s. Today, she comes in with an interpreter. She just started losartan 100 mg and amlodipine 10 mg daily.  She takes at home but not sure. She does not write down the blood pressure values. She denies heart disease in the past. She does not smoke. No cardiac stress test. Has not seen cardiology. 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 She is standing during the day. She denies chest pain or shortness of breath. She denies palpitations. She denies LH, dizziness, or syncope.  She an episode where she felt it was hard to breath, she was given her medicine and it resolved. This occurred in February. She  denies heart disease in parents. She has no siblings. Her son had a heart attack and he died in [place].   06-24-2021 Crt 1.3 K 4.5 LDL 80 TC 157 TSH 2.2 Hgb 9.8    Interim hx: Presented for help with hypertension. Her echo was normal. Did not take her blood pressure medication today. It's 188/100 mmHg. 136 to 140s low range up to 170s. She denies chest pain or SOB. No orthopnea, PND, or LE edema.   Interim hx 10/01/2022 Did not tolerate coreg well at 25 mg BID. She felt weak with this. Otherwise tolerating other Bp medications well. Bps at home around [id number] to 0000000 systolic  Cardiology Studies TTE- EF normal, GLS -18%,  RV function, no valve dx  Past Medical  History:  Diagnosis Date   Dementia ([place]) 03/28/2019   DM (diabetes mellitus), type 2, uncontrolled 2015   Has poor vision.  Family not aware of A1C or what sugars run.  Believe not well controlled.  Not able to monitor due to cost of monitoring equipment   Hypertension 2015    No past surgical history on file.  Current Medications: Current Meds  Medication Sig   acetaminophen (TYLENOL) 500 MG tablet Take 1,000 mg by mouth every 6 (six) hours as needed for mild pain or fever.   amLODipine-olmesartan (AZOR) 10-40 MG tablet Take 1 tablet by mouth daily.   ASPIRIN LOW DOSE 81 MG tablet TAKE 1 TABLET(81 MG) BY MOUTH DAILY   atorvastatin (LIPITOR) 40 MG tablet Take 1 tablet by mouth once daily   Blood Glucose Monitoring Suppl (ACCU-CHEK GUIDE ME) w/Device KIT Use as instructed to check blood sugars   Blood Glucose Monitoring Suppl (TRUE METRIX METER) w/Device KIT Use as directed   chlorthalidone (HYGROTON) 50 MG tablet Take 1 tablet (50 mg total) by mouth daily.   FARXIGA 10 MG TABS tablet TAKE 1 TABLET(10 MG) BY MOUTH DAILY BEFORE BREAKFAST   ferrous sulfate (FEROSUL) 325 (65 FE) MG tablet TAKE 1 TABLET(325 MG) BY MOUTH DAILY   gabapentin (NEURONTIN) 300 MG capsule Take 1 capsule (300 mg total) by mouth at  bedtime.   glucose blood (ACCU-CHEK GUIDE) test strip 1 each by Other route daily. And lancets 1/day   glucose blood (TRUE METRIX BLOOD GLUCOSE TEST) test strip Use as instructed   metFORMIN (GLUCOPHAGE-XR) 500 MG 24 hr tablet Take 4 tablets (2,000 mg total) by mouth daily. (Patient taking differently: Take 1,000 mg by mouth daily.)   Semaglutide (RYBELSUS) 7 MG TABS Take 1 tablet (7 mg total) by mouth daily before breakfast. Take 30 minutes before breakfast with 4 oz. water   TRUEplus Lancets 28G MISC Use as directed     Allergies:   Patient has no known allergies.   Social History   Socioeconomic History   Marital status: Married    Spouse name: [name]   Number of children: 7    Years of education: 3   Highest education level: 3rd grade  Occupational History   Occupation: Retired from farming in [place]  Tobacco Use   Smoking status: Never   Smokeless tobacco: Never  Vaping Use   Vaping Use: Never used  Substance and Sexual Activity   Alcohol use: Never   Drug use: Never   Sexual activity: Not on file  Other Topics Concern   Not on file  Social History Narrative   C[place] in [place]   Lives at home with husband   Her 2 daughters, 2 sons and their families   All together, 73 people in the home   Social Determinants of Health   Financial Resource Strain: Not on file  Food Insecurity: Not on file  Transportation Needs: Not on file  Physical Activity: Not on file  Stress: Not on file  Social Connections: Not on file     Family History: The patient's family history includes Diabetes in her mother. There is no history of Diabetes Mellitus II.  ROS:   Please see the history of present illness.     All other systems reviewed and are negative.  EKGs/Labs/Other Studies Reviewed:    The following studies were reviewed today:   EKG:  EKG is  ordered today.  The ekg ordered today demonstrates   07/09/2021-NSR, septal Q waves  10/01/2022: NSR with 1st degree AV block  Recent Labs: 04/15/2022: ALT 12; Hemoglobin 9.3; Platelet Count 236 09/23/2022: BUN 29; Creatinine, Ser 1.43; Potassium 4.6; Sodium 137   Recent Lipid Panel    Component Value Date/Time   CHOL 157 06/19/2021 1125   TRIG 218 (H) 06/19/2021 1125   HDL 41 06/19/2021 1125   CHOLHDL 3.8 06/19/2021 1125   LDLCALC 80 06/19/2021 1125   LDLDIRECT 74.0 05/15/2022 0855     Risk Assessment/Calculations:           Physical Exam:    VS  Vitals:   10/01/22 0810  BP: (!) 144/62  Pulse: 94  SpO2: 98%     Wt Readings from Last 3 Encounters:  10/01/22 151 lb 6.4 oz (68.7 kg)  09/25/22 149 lb 9.6 oz (67.9 kg)  05/20/22 149 lb 6.4 oz (67.8 kg)     GEN:  Well nourished, well  developed in no acute distress HEENT: Normal NECK: No JVD; No carotid bruits LYMPHATICS: No lymphadenopathy CARDIAC: RRR, no murmurs, rubs, gallops RESPIRATORY:  Clear to auscultation without rales, wheezing or rhonchi  ABDOMEN: Soft, non-tender, non-distended MUSCULOSKELETAL:  No edema; No deformity  SKIN: Warm and dry NEUROLOGIC:  Alert and oriented x 3 PSYCHIATRIC:  Normal affect   ASSESSMENT:    #Resistant Hypertension: She is on 3  BP medications and blood pressure is not at goal. She had no signs of pheo. Her TTE showed normal LV function. I instructed her and her daughter to continue to take her blood pressures at home. Discussed if not feeling well with this dose of coeg, to let us know; she can decrease to 3.125 mg BID - BP [id number] systolic - continue coreg at lower dose 6.25 mg BID (switched from metoprolol) - off spironolactone 2/2 hyperkalemia - continue chlorthalidone 50 mg daily - continue norvasc 10 mg daily-olmesartan 40 mg  CVD Risk Mitigation: continue atorvastatin 40 mg. LDL 80 mg/dL   PLAN:    In order of problems listed above:   Decrease coreg to 6.25 mg BID, can decrease to 3.125 mg BID if still feeling weak Follow up in 12 months          Medication Adjustments/Labs and Tests Ordered: Current medicines are reviewed at length with the patient today.  Concerns regarding medicines are outlined above.   Signed, [name], MD  10/01/2022 8:25 AM    [facility]

## 2022-10-10 ENCOUNTER — Other Ambulatory Visit: Payer: Self-pay | Admitting: Family

## 2022-10-10 DIAGNOSIS — E785 Hyperlipidemia, unspecified: Secondary | ICD-10-CM

## 2022-10-10 NOTE — Telephone Encounter (Signed)
Requested medication (s) are due for refill today: yes  Requested medication (s) are on the active medication list: yes  Last refill:  09/16/22 #30  Future visit scheduled: no  Notes to clinic:  using agent with [employer] agent "Hnhn" called pt and LM on VM to make appointment for follow up and medication refills.   Requested Prescriptions  Pending Prescriptions Disp Refills   atorvastatin (LIPITOR) 40 MG tablet [Pharmacy Med Name: Atorvastatin Calcium 40 MG Oral Tablet] 30 tablet 0    Sig: TAKE 1 TABLET BY MOUTH ONCE DAILY ** PATIENT NEEDS APPOINTMENT**     Cardiovascular:  Antilipid - Statins Failed - 10/10/2022  9:37 AM      Failed - Valid encounter within last 12 months    Recent Outpatient Visits           1 year ago Type 2 diabetes mellitus without complication, without long-term current use of insulin ([place])   [facility], Amy J, NP   1 year ago Type 2 diabetes mellitus without complication, without long-term current use of insulin ([place])   [facility] [facility], [place], NP   1 year ago Essential hypertension   [facility], [place], NP   1 year ago Medicare annual wellness visit, initial   [facility], [place], NP   1 year ago Encounter to establish care   [facility] [facility], Amy J, NP              Failed - Lipid Panel in normal range within the last 12 months    Cholesterol, Total  Date Value Ref Range Status  06/19/2021 157 100 - 199 mg/dL Final   LDL Chol Calc (NIH)  Date Value Ref Range Status  06/19/2021 80 0 - 99 mg/dL Final   Direct LDL  Date Value Ref Range Status  05/15/2022 74.0 mg/dL Final    Comment:    Optimal:  <100 mg/dLNear or Above Optimal:  100-129 mg/dLBorderline High:  130-159 mg/dLHigh:  160-189 mg/dLVery High:  >190 mg/dL   HDL  Date Value  Ref Range Status  06/19/2021 41 >39 mg/dL Final   Triglycerides  Date Value Ref Range Status  06/19/2021 218 (H) 0 - 149 mg/dL Final         Passed - Patient is not pregnant

## 2022-10-14 ENCOUNTER — Other Ambulatory Visit: Payer: Self-pay

## 2022-10-14 DIAGNOSIS — E1165 Type 2 diabetes mellitus with hyperglycemia: Secondary | ICD-10-CM

## 2022-10-14 MED ORDER — DAPAGLIFLOZIN PROPANEDIOL 10 MG PO TABS: ORAL_TABLET | ORAL | 3 refills | Status: DC

## 2022-10-27 ENCOUNTER — Other Ambulatory Visit: Payer: Self-pay

## 2022-10-27 DIAGNOSIS — C9000 Multiple myeloma not having achieved remission: Secondary | ICD-10-CM

## 2022-10-28 ENCOUNTER — Inpatient Hospital Stay (HOSPITAL_BASED_OUTPATIENT_CLINIC_OR_DEPARTMENT_OTHER): Payer: Medicaid Other | Admitting: Hematology

## 2022-10-28 ENCOUNTER — Other Ambulatory Visit: Payer: Self-pay

## 2022-10-28 ENCOUNTER — Inpatient Hospital Stay: Payer: Medicaid Other | Attending: Hematology

## 2022-10-28 VITALS — BP 119/62 | HR 86 | Temp 97.3°F | Resp 18 | Wt 145.7 lb

## 2022-10-28 DIAGNOSIS — D649 Anemia, unspecified: Secondary | ICD-10-CM | POA: Diagnosis not present

## 2022-10-28 DIAGNOSIS — D472 Monoclonal gammopathy: Secondary | ICD-10-CM | POA: Insufficient documentation

## 2022-10-28 DIAGNOSIS — C9000 Multiple myeloma not having achieved remission: Secondary | ICD-10-CM

## 2022-10-28 LAB — CMP (CANCER CENTER ONLY)
ALT: 11 U/L (ref 0–44)
AST: 9 U/L — ABNORMAL LOW (ref 15–41)
Albumin: 3.9 g/dL (ref 3.5–5.0)
Alkaline Phosphatase: 39 U/L (ref 38–126)
Anion gap: 8 (ref 5–15)
BUN: 34 mg/dL — ABNORMAL HIGH (ref 8–23)
CO2: 24 mmol/L (ref 22–32)
Calcium: 8.7 mg/dL — ABNORMAL LOW (ref 8.9–10.3)
Chloride: 107 mmol/L (ref 98–111)
Creatinine: 1.49 mg/dL — ABNORMAL HIGH (ref 0.44–1.00)
GFR, Estimated: 38 mL/min — ABNORMAL LOW (ref >60)
Glucose, Bld: 325 mg/dL — ABNORMAL HIGH (ref 70–99)
Potassium: 4.3 mmol/L (ref 3.5–5.1)
Sodium: 139 mmol/L (ref 135–145)
Total Bilirubin: 0.4 mg/dL (ref 0.3–1.2)
Total Protein: 7.9 g/dL (ref 6.5–8.1)

## 2022-10-28 LAB — CBC WITH DIFFERENTIAL (CANCER CENTER ONLY)
Abs Immature Granulocytes: 0.02 10*3/uL (ref 0.00–0.07)
Basophils Absolute: 0 10*3/uL (ref 0.0–0.1)
Basophils Relative: 1 %
Eosinophils Absolute: 0.2 10*3/uL (ref 0.0–0.5)
Eosinophils Relative: 4 %
HCT: 28.5 % — ABNORMAL LOW (ref 36.0–46.0)
Hemoglobin: 9.1 g/dL — ABNORMAL LOW (ref 12.0–15.0)
Immature Granulocytes: 0 %
Lymphocytes Relative: 29 %
Lymphs Abs: 1.8 10*3/uL (ref 0.7–4.0)
MCH: 25.9 pg — ABNORMAL LOW (ref 26.0–34.0)
MCHC: 31.9 g/dL (ref 30.0–36.0)
MCV: 81 fL (ref 80.0–100.0)
Monocytes Absolute: 0.6 10*3/uL (ref 0.1–1.0)
Monocytes Relative: 9 %
Neutro Abs: 3.5 10*3/uL (ref 1.7–7.7)
Neutrophils Relative %: 57 %
Platelet Count: 237 10*3/uL (ref 150–400)
RBC: 3.52 MIL/uL — ABNORMAL LOW (ref 3.87–5.11)
RDW: 14.8 % (ref 11.5–15.5)
WBC Count: 6.1 10*3/uL (ref 4.0–10.5)
nRBC: 0 % (ref 0.0–0.2)

## 2022-10-28 LAB — FERRITIN: Ferritin: 288 ng/mL (ref 11–307)

## 2022-10-28 LAB — IRON AND IRON BINDING CAPACITY (CC-WL,HP ONLY)
Iron: 105 ug/dL (ref 28–170)
Saturation Ratios: 38 % — ABNORMAL HIGH (ref 10.4–31.8)
TIBC: 276 ug/dL (ref 250–450)
UIBC: 171 ug/dL (ref 148–442)

## 2022-10-28 NOTE — Progress Notes (Signed)
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  She is accompanied by her son, who provided interpretation.  I had a phone visit with the patient on 06/25/2022 and she was doing well overall.   Patient reports she has been doing well overall without any new medical concerns since our last visit. She notes that her blood pressure and diabetes are well-controlled.  She denies fever, chills, night sweats, infection issues, abnormal bowel movement, abnormal bleeding, abdominal pain, back pain, chest pain, or leg swelling. She does complain of continuous neck pain.   She notes that her diarrhea has improved since our last visit.   Patient reports that she had right sided body pains around 1-2 months ago, which has improved.  Patient regularly follows-up with her PCP. She notes that her kidney specialist has discharged her and told her to continue to follow up with her PCP.   MEDICAL HISTORY:  Past Medical History:  Diagnosis Date   Dementia ([place]) 03/28/2019   DM (diabetes mellitus), type 2, uncontrolled 2015   Has poor vision.  Family not aware of A1C or what sugars run.  Believe not well controlled.  Not able to monitor due to cost of monitoring equipment   Hypertension 2015    SURGICAL HISTORY: No past surgical history on file.  SOCIAL HISTORY: Social History   Socioeconomic  History   Marital status: Married    Spouse name: Jennifer Molina   Number of children: 7   Years of education: 3   Highest education level: 3rd grade  Occupational History   Occupation: Retired from farming in [place]  Tobacco Use   Smoking status: Never   Smokeless tobacco: Never  Vaping Use   Vaping Use: Never used  Substance and Sexual Activity   Alcohol use: Never   Drug use: Never   Sexual activity: Not on file  Other Topics Concern   Not on file  Social History Narrative   [place] to [place] in [place]   Lives at home with husband   Her 2 daughters, 2 sons and their families   All together, 39 people in the home   Social Determinants of Health   Financial Resource Strain: Not on file  Food Insecurity: Not on file  Transportation Needs: Not on file  Physical Activity: Not on file  Stress: Not on file  Social Connections: Not on file  Intimate Partner Violence: Not on file    FAMILY HISTORY: Family History  Problem Relation Age of Onset   Diabetes Mother    Diabetes Mellitus II Neg Hx     ALLERGIES:  has No Known Allergies.  MEDICATIONS:  Current Outpatient Medications  Medication Sig Dispense Refill   acetaminophen (TYLENOL) 500 MG tablet Take 1,000 mg by mouth every 6 (six) hours as needed for mild pain or fever.     amLODipine-olmesartan (AZOR) 10-40 MG tablet Take 1 tablet by mouth daily.     ASPIRIN LOW DOSE  81 MG tablet TAKE 1 TABLET(81 MG) BY MOUTH DAILY 30 tablet 2   atorvastatin (LIPITOR) 40 MG tablet Take 1 tablet by mouth once daily 30 tablet 0   Blood Glucose Monitoring Suppl (ACCU-CHEK GUIDE ME) w/Device KIT Use as instructed to check blood sugars 1 kit 0   Blood Glucose Monitoring Suppl (TRUE METRIX METER) w/Device KIT Use as directed 1 kit 0   carvedilol (COREG) 6.25 MG tablet Take 1 tablet (6.25 mg total) by mouth 2 (two) times daily. 180 tablet 3   chlorthalidone (HYGROTON) 50 MG tablet Take 1 tablet (50 mg total) by mouth daily. 90 tablet 3    dapagliflozin propanediol (FARXIGA) 10 MG TABS tablet TAKE 1 TABLET(10 MG) BY MOUTH DAILY BEFORE BREAKFAST 90 tablet 3   ferrous sulfate (FEROSUL) 325 (65 FE) MG tablet TAKE 1 TABLET(325 MG) BY MOUTH DAILY 30 tablet 5   gabapentin (NEURONTIN) 300 MG capsule Take 1 capsule (300 mg total) by mouth at bedtime. 30 capsule 5   glucose blood (ACCU-CHEK GUIDE) test strip 1 each by Other route daily. And lancets 1/day 100 each 3   glucose blood (TRUE METRIX BLOOD GLUCOSE TEST) test strip Use as instructed 100 each 12   metFORMIN (GLUCOPHAGE-XR) 500 MG 24 hr tablet Take 4 tablets (2,000 mg total) by mouth daily. (Patient taking differently: Take 1,000 mg by mouth daily.) 360 tablet 3   Semaglutide (RYBELSUS) 7 MG TABS Take 1 tablet (7 mg total) by mouth daily before breakfast. Take 30 minutes before breakfast with 4 oz. water 30 tablet 3   TRUEplus Lancets 28G MISC Use as directed 100 each 4   No current facility-administered medications for this visit.    REVIEW OF SYSTEMS:   .10 Point review of Systems was done is negative except as noted above.  PHYSICAL EXAMINATION: .BP 119/62   Pulse 86   Temp (!) 97.3 F (36.3 C)   Resp 18   Wt 145 lb 11.2 oz (66.1 kg)   LMP  (LMP Unknown)   SpO2 99%   BMI 25.81 kg/m  . GENERAL:alert, in no acute distress and comfortable SKIN: no acute rashes, no significant lesions EYES: conjunctiva are pink and non-injected, sclera anicteric OROPHARYNX: MMM, no exudates, no oropharyngeal erythema or ulceration NECK: supple, no JVD LYMPH:  no palpable lymphadenopathy in the cervical, axillary or inguinal regions LUNGS: clear to auscultation b/l with normal respiratory effort HEART: regular rate & rhythm ABDOMEN:  normoactive bowel sounds , non tender, not distended. Extremity: no pedal edema PSYCH: alert & oriented x 3 with fluent speech NEURO: no focal motor/sensory deficits   LABORATORY DATA:  I have reviewed the data as listed  .    Latest Ref Rng &  Units 10/28/2022    8:59 AM 04/15/2022    9:27 AM 11/29/2021    9:08 AM  CBC  WBC 4.0 - 10.5 K/uL 6.1  5.8  5.5   Hemoglobin 12.0 - 15.0 g/dL 9.1  9.3  8.9   Hematocrit 36.0 - 46.0 % 28.5  28.3  27.8   Platelets 150 - 400 K/uL 237  236  214    CBC    Component Value Date/Time   WBC 6.1 10/28/2022 0859   WBC 7.8 10/02/2020 1504   RBC 3.52 (L) 10/28/2022 0859   HGB 9.1 (L) 10/28/2022 0859   HGB 12.2 03/28/2019 1143   HCT 28.5 (L) 10/28/2022 0859   HCT 37.4 03/28/2019 1143   PLT 237 10/28/2022 0859   PLT  295 03/28/2019 1143   MCV 81.0 10/28/2022 0859   MCV 83 03/28/2019 1143   MCH 25.9 (L) 10/28/2022 0859   MCHC 31.9 10/28/2022 0859   RDW 14.8 10/28/2022 0859   RDW 14.4 03/28/2019 1143   LYMPHSABS 1.8 10/28/2022 0859   LYMPHSABS 2.5 03/28/2019 1143   MONOABS 0.6 10/28/2022 0859   EOSABS 0.2 10/28/2022 0859   EOSABS 0.1 03/28/2019 1143   BASOSABS 0.0 10/28/2022 0859   BASOSABS 0.1 03/28/2019 1143       Latest Ref Rng & Units 10/28/2022    8:59 AM 09/23/2022    8:15 AM 05/15/2022    8:55 AM  CMP  Glucose 70 - 99 mg/dL 325  200  140   BUN 8 - 23 mg/dL 34  29    Creatinine 0.44 - 1.00 mg/dL 1.49  1.43    Sodium 135 - 145 mmol/L 139  137    Potassium 3.5 - 5.1 mmol/L 4.3  4.6    Chloride 98 - 111 mmol/L 107  104    CO2 22 - 32 mmol/L 24  23    Calcium 8.9 - 10.3 mg/dL 8.7  9.2    Total Protein 6.5 - 8.1 g/dL 7.9     Total Bilirubin 0.3 - 1.2 mg/dL 0.4     Alkaline Phos 38 - 126 U/L 39     AST 15 - 41 U/L 9     ALT 0 - 44 U/L 11      . Lab Results  Component Value Date   IRON 105 10/28/2022   TIBC 276 10/28/2022   IRONPCTSAT 38 (H) 10/28/2022   (Iron and TIBC)  Lab Results  Component Value Date   FERRITIN 288 10/28/2022     09/24/2020 Surgical Pathology DIAGNOSIS:   BONE MARROW, ASPIRATE, CLOT, CORE:  -Slightly hypercellular bone marrow for age with plasma cell neoplasm  -See comment   PERIPHERAL BLOOD:  -Normocytic-normochromic anemia   COMMENT:    The bone marrow is hypercellular for age with slight increase in plasma  cells representing 8% of all cells in the aspirate with lack of large  aggregates or sheets.  Immunohistochemical stains highlight the  increased plasma cell component in the clot/biopsy sections correlating  with the aspirate.  The plasma cells display kappa light chain  restriction consistent with plasma cell neoplasm.  Correlation with  cytogenetic and FISH studies recommended.   09/24/2020 Molecular Pathology    09/24/2020 Cytogenetics Report    RADIOGRAPHIC STUDIES: I have personally reviewed the radiological images as listed and agreed with the findings in the report. No results found.   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 Iron levels adequate. Will likely need consideration of erythropoietin if hemoglobin remains less than 9 and hypertension is well controlled.  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 Unlikely to be related to her monoclonal paraproteinemia-follows with Dr. Trudi Ida completely rule out possibility of light chain deposition disease or AL amyloidosis. We will defer to nephrology regarding need for kidney biopsy to rule out these possibilities if indicated.  PLAN: -Discussed lab results from today, 10/28/2022, with the patient and her family member.  CBC shows that the patient is anemic with hemoglobin of 9.1 and hematocrit of 28.5.  CMP showed uncontrolled DM2 with blood glucose  of 325. Stable CKD with creatinine of 1.49 -ferritin 288 with iron saturation of 38% - no inidication for iv iron at this time. -myeloma panel shows M spike lower at 1.3 down from 1.9. -Patient has no clinical or lab findings suggestive of progression to multiple myeloma at this  time.  -Answered all of patient's questions.    FOLLOW-UP: RTC with Dr Irene Limbo with labs in 4 months  The total time spent in the appointment was 23 minutes* .  All of the patient's questions were answered with apparent satisfaction. The patient knows to call the clinic with any problems, questions or concerns.   Sullivan Lone MD MS AAHIVMS [facility] Hematology/Oncology Physician [facility]  .*Total Encounter Time as defined by the Centers for Medicare and Medicaid Services includes, in addition to the face-to-face time of a patient visit (documented in the note above) non-face-to-face time: obtaining and reviewing outside history, ordering and reviewing medications, tests or procedures, care coordination (communications with other health care professionals or caregivers) and documentation in the medical record.   I, Cleda Mccreedy, am acting as a Education administrator for Sullivan Lone, MD. .I have reviewed the above documentation for accuracy and completeness, and I agree with the above. [name] MD

## 2022-10-30 LAB — KAPPA/LAMBDA LIGHT CHAINS
Kappa free light chain: 41 mg/L — ABNORMAL HIGH (ref 3.3–19.4)
Kappa, lambda light chain ratio: 2.02 — ABNORMAL HIGH (ref 0.26–1.65)
Lambda free light chains: 20.3 mg/L (ref 5.7–26.3)

## 2022-10-31 LAB — MULTIPLE MYELOMA PANEL, SERUM
Albumin SerPl Elph-Mcnc: 3.5 g/dL (ref 2.9–4.4)
Albumin/Glob SerPl: 1 (ref 0.7–1.7)
Alpha 1: 0.2 g/dL (ref 0.0–0.4)
Alpha2 Glob SerPl Elph-Mcnc: 0.9 g/dL (ref 0.4–1.0)
B-Globulin SerPl Elph-Mcnc: 0.8 g/dL (ref 0.7–1.3)
Gamma Glob SerPl Elph-Mcnc: 1.9 g/dL — ABNORMAL HIGH (ref 0.4–1.8)
Globulin, Total: 3.7 g/dL (ref 2.2–3.9)
IgA: 92 mg/dL (ref 87–352)
IgG (Immunoglobin G), Serum: 1585 mg/dL (ref 586–1602)
IgM (Immunoglobulin M), Srm: 51 mg/dL (ref 26–217)
M Protein SerPl Elph-Mcnc: 1.3 g/dL — ABNORMAL HIGH
Total Protein ELP: 7.2 g/dL (ref 6.0–8.5)

## 2022-11-20 ENCOUNTER — Other Ambulatory Visit (INDEPENDENT_AMBULATORY_CARE_PROVIDER_SITE_OTHER): Payer: Medicaid Other

## 2022-11-20 DIAGNOSIS — E782 Mixed hyperlipidemia: Secondary | ICD-10-CM | POA: Diagnosis not present

## 2022-11-20 DIAGNOSIS — E1165 Type 2 diabetes mellitus with hyperglycemia: Secondary | ICD-10-CM

## 2022-11-20 LAB — BASIC METABOLIC PANEL
BUN: 30 mg/dL — ABNORMAL HIGH (ref 6–23)
CO2: 25 mEq/L (ref 19–32)
Calcium: 9.1 mg/dL (ref 8.4–10.5)
Chloride: 107 mEq/L (ref 96–112)
Creatinine, Ser: 1.42 mg/dL — ABNORMAL HIGH (ref 0.40–1.20)
GFR: 37.99 mL/min — ABNORMAL LOW (ref >60.00)
Glucose, Bld: 230 mg/dL — ABNORMAL HIGH (ref 70–99)
Potassium: 4.4 mEq/L (ref 3.5–5.1)
Sodium: 136 mEq/L (ref 135–145)

## 2022-11-20 LAB — LDL CHOLESTEROL, DIRECT: Direct LDL: 60 mg/dL

## 2022-11-21 LAB — FRUCTOSAMINE: Fructosamine: 333 umol/L — ABNORMAL HIGH (ref 0–285)

## 2022-11-25 ENCOUNTER — Ambulatory Visit (INDEPENDENT_AMBULATORY_CARE_PROVIDER_SITE_OTHER): Payer: Medicaid Other | Admitting: Endocrinology

## 2022-11-25 ENCOUNTER — Encounter: Payer: Self-pay | Admitting: Endocrinology

## 2022-11-25 VITALS — BP 120/64 | HR 87 | Ht 63.0 in | Wt 153.6 lb

## 2022-11-25 DIAGNOSIS — E1165 Type 2 diabetes mellitus with hyperglycemia: Secondary | ICD-10-CM

## 2022-11-25 MED ORDER — GLIMEPIRIDE 2 MG PO TABS: 2.0000 mg | ORAL_TABLET | Freq: Every day | ORAL | 3 refills | Status: DC

## 2022-11-25 NOTE — Patient Instructions (Addendum)
Take 2 metformin at dinner  New Rx: Glimeperide, take at dinner  If she has any weak or shaky feeling check sugar  Check blood sugars on waking up 3  days a week with accucheck  Also check blood sugars about 2 hours after meals and do this after different meals by rotation  Recommended blood sugar levels on waking up are 80-130 and about 2 hours after meal is 130-180  Please bring your blood sugar monitor to each visit, thank you

## 2022-11-25 NOTE — Progress Notes (Signed)
Patient ID: Jennifer Molina, female   DOB: 11-24-1955, 67 y.o.   MRN: [id number]           Reason for Appointment: Type II Diabetes follow-up   History of Present Illness   Diagnosis date: 2019  Previous history:  Non-insulin hypoglycemic drugs previously used: Metformin, Amaryl Has not been on insulin except in the hospital in 2020  A1c range in the last few years is: 7.3-9.1  Recent history:     Non-insulin hypoglycemic drugs: Metformin 1g daily,   Farxiga 10 mg daily       Side effects from medications: Heartburn from Rybelsus  Current self management, blood sugar patterns and problems identified:  A1c is last 9.3, was 7.4 previously Fructosamine is now 333  Patient is unable to give a history and information/interpretation was done by her granddaughter She does not like to do fingersticks herself and has not checked her sugar She was told to try Rybelsus but she thinks she had some chest discomfort possibly heartburn with taking this twice and did not continue it and also did not let us know when she tried it 2 months ago She is only taking 1 metformin twice a day as she thinks that taking 2 tablets is too much Not doing much exercise because of back pain and leg pain Fasting lab glucose was 230 Renal function is stable, still not normal  Exercise: none  Diet management: Usually has 3 meals a day Has more tofu for protein than meat     Hypoglycemia:  none    Glucometer:  ? True Metrix or Accu-Chek  Blood Glucose readings not done  Dietician visit: Most recent: 7/23  Weight control:  Wt Readings from Last 3 Encounters:  11/25/22 153 lb 9.6 oz (69.7 kg)  10/28/22 145 lb 11.2 oz (66.1 kg)  10/01/22 151 lb 6.4 oz (68.7 kg)            Diabetes labs:  Lab Results  Component Value Date   HGBA1C 9.2 (H) 09/23/2022   HGBA1C 7.4 (H) 05/15/2022   HGBA1C 7.3 (A) 12/12/2021   Lab Results  Component Value Date   MICROALBUR 11.8 (H) 09/23/2022   LDLCALC 80  06/19/2021   CREATININE 1.42 (H) 11/20/2022   Lab Results  Component Value Date   FRUCTOSAMINE 333 (H) 11/20/2022   FRUCTOSAMINE 286 (H) 05/15/2022   Lab Results  Component Value Date   HGB 9.1 (L) 10/28/2022     Allergies as of 11/25/2022   No Known Allergies      Medication List        Accurate as of November 25, 2022  1:56 PM. If you have any questions, ask your nurse or doctor.          STOP taking these medications    Rybelsus 7 MG Tabs Generic drug: Semaglutide Stopped by: Reather Littler, MD       TAKE these medications    acetaminophen 500 MG tablet Commonly known as: TYLENOL Take 1,000 mg by mouth every 6 (six) hours as needed for mild pain or fever.   amLODipine-olmesartan 10-40 MG tablet Commonly known as: AZOR Take 1 tablet by mouth daily.   Aspirin Low Dose 81 MG tablet Generic drug: aspirin EC TAKE 1 TABLET(81 MG) BY MOUTH DAILY   atorvastatin 40 MG tablet Commonly known as: LIPITOR Take 1 tablet by mouth once daily   carvedilol 6.25 MG tablet Commonly known as: COREG Take 1 tablet (6.25 mg total) by mouth 2 (  two) times daily.   chlorthalidone 50 MG tablet Commonly known as: HYGROTON Take 1 tablet (50 mg total) by mouth daily.   dapagliflozin propanediol 10 MG Tabs tablet Commonly known as: Farxiga TAKE 1 TABLET(10 MG) BY MOUTH DAILY BEFORE BREAKFAST   ferrous sulfate 325 (65 FE) MG tablet Commonly known as: FeroSul TAKE 1 TABLET(325 MG) BY MOUTH DAILY   gabapentin 300 MG capsule Commonly known as: NEURONTIN Take 1 capsule (300 mg total) by mouth at bedtime.   glimepiride 2 MG tablet Commonly known as: AMARYL Take 1 tablet (2 mg total) by mouth daily with supper. Started by: Reather LittlerAjay Kallan Merrick, MD   metFORMIN 500 MG 24 hr tablet Commonly known as: GLUCOPHAGE-XR Take 4 tablets (2,000 mg total) by mouth daily. What changed: how much to take   True Metrix Blood Glucose Test test strip Generic drug: glucose blood Use as instructed    Accu-Chek Guide test strip Generic drug: glucose blood 1 each by Other route daily. And lancets 1/day   True Metrix Meter w/Device Kit Use as directed   Accu-Chek Guide Me w/Device Kit Use as instructed to check blood sugars   TRUEplus Lancets 28G Misc Use as directed        Allergies: No Known Allergies  Past Medical History:  Diagnosis Date   Dementia 03/28/2019   DM (diabetes mellitus), type 2, uncontrolled 2015   Has poor vision.  Family not aware of A1C or what sugars run.  Believe not well controlled.  Not able to monitor due to cost of monitoring equipment   Hypertension 2015    No past surgical history on file.  Family History  Problem Relation Age of Onset   Diabetes Mother    Diabetes Mellitus II Neg Hx     Social History:  reports that she has never smoked. She has never used smokeless tobacco. She reports that she does not drink alcohol and does not use drugs.  Review of Systems:  Last diabetic eye exam date 9/23 She has proliferative retinopathy and has had surgery for complications including vitrectomy  Last foot exam date:11/22  Symptoms of neuropathy: Has numbness in feet  Hypertension:   Treatment includes olmesartan 40 mg daily,  followed by hypertension clinic at Dr. Elissa HeftyHarding's office  BP Readings from Last 3 Encounters:  11/25/22 120/64  10/28/22 119/62  10/01/22 (!) 144/62   She has had variable renal function since 2021 with creatinine as high as 2 She is seeing a nephrologist periodically, Dr. Valentino NosePeeples  Lab Results  Component Value Date   CREATININE 1.42 (H) 11/20/2022   CREATININE 1.49 (H) 10/28/2022   CREATININE 1.43 (H) 09/23/2022     Lipid management: She has been treated with Lipitor 40 mg daily, prescribed by her PCP, previously was on Crestor    Lab Results  Component Value Date   CHOL 157 06/19/2021   CHOL 222 (H) 03/28/2019   Lab Results  Component Value Date   HDL 41 06/19/2021   HDL 40 03/28/2019   Lab  Results  Component Value Date   LDLCALC 80 06/19/2021   LDLCALC 104 (H) 03/28/2019   Lab Results  Component Value Date   TRIG 218 (H) 06/19/2021   TRIG 389 (H) 03/28/2019   Lab Results  Component Value Date   CHOLHDL 3.8 06/19/2021   Lab Results  Component Value Date   LDLDIRECT 60.0 11/20/2022   LDLDIRECT 74.0 05/15/2022     Examination:   BP 120/64 (BP Location: Left Arm, Patient  Position: Sitting, Cuff Size: Normal)   Pulse 87   Ht  (1.6 m)   Wt 153 lb 9.6 oz (69.7 kg)   LMP  (LMP Unknown)   SpO2 90%   BMI 27.21 kg/m   Body mass index is 27.21 kg/m.    ASSESSMENT/ PLAN:    Diabetes type 2 non-insulin-dependent:   Current regimen: Farxiga 10 mg daily and metformin 1 g daily  See history of present illness for detailed discussion of current diabetes management, blood sugar patterns and problems identified  A1c is 9.2 previously and now fructosamine is 333  She has not checked her blood sugars and appears to be needing help with doing this at home Also could not apparently tolerate Rybelsus Discussed that she likely has some progression of her diabetes with higher A1c and also that her lab glucose was 230 Not able to do any exercise lately  She does also have anemia and renal insufficiency Metformin dose is limited by her renal function but may benefit from 1500 mg   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, most recent GFR is unchanged at 38  Hypertension: Fair control  Recommendations:  Her granddaughter will help her check her blood sugars and discussed checking blood sugars at different times with blood sugar targets as in her visit summary Trial of AMARYL 2 mg at dinnertime, also discussed potential for hypoglycemia She will add another metformin at dinnertime Discussed that if she is not getting better control may need to consider insulin  Follow-up in 2 months  Patient Instructions  Take 2 metformin at dinner  New Rx:  Glimeperide, take at dinner  If she has any weak or shaky feeling check sugar  Check blood sugars on waking up 3  days a week with accucheck  Also check blood sugars about 2 hours after meals and do this after different meals by rotation  Recommended blood sugar levels on waking up are 80-130 and about 2 hours after meal is 130-180  Please bring your blood sugar monitor to each visit, thank you   Total visit time for evaluation and management and counseling = 30 minutes  Audrionna Lampton 11/25/2022, 1:56 PM

## 2022-12-16 ENCOUNTER — Other Ambulatory Visit: Payer: Self-pay

## 2022-12-16 MED ORDER — METFORMIN HCL ER 500 MG PO TB24: 1000.0000 mg | ORAL_TABLET | Freq: Every day | ORAL | 3 refills | Status: DC

## 2022-12-19 ENCOUNTER — Other Ambulatory Visit: Payer: Self-pay | Admitting: Hematology

## 2022-12-19 ENCOUNTER — Other Ambulatory Visit: Payer: Self-pay | Admitting: Family

## 2022-12-19 DIAGNOSIS — E785 Hyperlipidemia, unspecified: Secondary | ICD-10-CM

## 2023-01-05 ENCOUNTER — Ambulatory Visit (INDEPENDENT_AMBULATORY_CARE_PROVIDER_SITE_OTHER): Payer: Medicaid Other | Admitting: "Endocrinology

## 2023-01-05 ENCOUNTER — Encounter: Payer: Self-pay | Admitting: "Endocrinology

## 2023-01-05 VITALS — BP 130/60 | HR 83 | Ht 63.0 in | Wt 150.6 lb

## 2023-01-05 DIAGNOSIS — E7800 Pure hypercholesterolemia, unspecified: Secondary | ICD-10-CM

## 2023-01-05 DIAGNOSIS — E1165 Type 2 diabetes mellitus with hyperglycemia: Secondary | ICD-10-CM

## 2023-01-05 DIAGNOSIS — E1140 Type 2 diabetes mellitus with diabetic neuropathy, unspecified: Secondary | ICD-10-CM

## 2023-01-05 DIAGNOSIS — G629 Polyneuropathy, unspecified: Secondary | ICD-10-CM

## 2023-01-05 DIAGNOSIS — Z7984 Long term (current) use of oral hypoglycemic drugs: Secondary | ICD-10-CM | POA: Diagnosis not present

## 2023-01-05 LAB — POCT GLYCOSYLATED HEMOGLOBIN (HGB A1C): Hemoglobin A1C: 6.1 % — AB (ref 4.0–5.6)

## 2023-01-05 NOTE — Progress Notes (Signed)
Outpatient Endocrinology Note Jennifer [place], MD  01/05/23   Jennifer Molina 12/07/1955 [id number]  Referring Provider: Rema Fendt, NP Primary Care Provider: Rema Fendt, NP Reason for consultation: Subjective   Assessment & Plan  Diagnoses and all orders for this visit:  Uncontrolled type 2 diabetes mellitus with hyperglycemia, without long-term current use of insulin (HCC) -     POCT glycosylated hemoglobin (Hb A1C) -     Fructosamine; Future -     Comp Met (CMET); Future  Neuropathy  Long term (current) use of oral hypoglycemic drugs  Pure hypercholesterolemia    Diabetes complicated by retinopathy, neuropathy Hba1c goal less than 7.0, current Hba1c is 6.1 (7.3 per fructosamine conversion). Will recommend for the following change of medications to: Continue current regimen: Metformin XR 500 mg two pills daily, Farxiga 10 mg daily, glimepiride 2 mg after supper  On gabapentin 300 mg at bedtime by another provider Discussed the need to check blood sugar alternating times of the day, including before meals and before bedtime Discussed hypoglycemic potential of glimepiride and the need to avoid glimepiride if the blood sugars running low/skipping meals Instructed to notify us if the GFR dropped, patient following with nephrology Daughter understands and explained to patient Discussed footcare due to neuropathy  No known contraindications to any of above medications  Hyperlipidemia -Last LDL at goal: 60 -on atorvastatin 40 mg QD -Follow low fat diet and exercise   -Blood pressure goal <140/90 - Microalbumin/creatinine at goal < 30 -On olemsartan 40 mg qd -diet changes including salt restriction -limit eating outside -counseled BP targets per standards of diabetes care -Uncontrolled blood pressure can lead to retinopathy, nephropathy and cardiovascular and atherosclerotic heart disease  Reviewed and counseled on: -A1C target -Blood sugar  targets -Complications of uncontrolled diabetes  -Checking blood sugar before meals and bedtime and bring log next visit -All medications with mechanism of action and side effects -Hypoglycemia management: rule of 15's, Glucagon Emergency Kit and medical alert ID -low-carb low-fat plate-method diet -At least 20 minutes of physical activity per day -Annual dilated retinal eye exam and foot exam -compliance and follow up needs -follow up as scheduled or earlier if problem gets worse  Call if blood sugar is less than 70 or consistently above 250    Take a 15 gm snack of carbohydrate at bedtime before you go to sleep if your blood sugar is less than 100.    If you are going to fast after midnight for a test or procedure, ask your physician for instructions on how to reduce/decrease your insulin dose.    Call if blood sugar is less than 70 or consistently above 250  -Treating a low sugar by rule of 15  (15 gms of sugar every 15 min until sugar is more than 70) If you feel your sugar is low, test your sugar to be sure If your sugar is low (less than 70), then take 15 grams of a fast acting Carbohydrate (3-4 glucose tablets or glucose gel or 4 ounces of juice or regular soda) Recheck your sugar 15 min after treating low to make sure it is more than 70 If sugar is still less than 70, treat again with 15 grams of carbohydrate          Don't drive the hour of hypoglycemia  If unconscious/unable to eat or drink by mouth, use glucagon injection or nasal spray baqsimi and call 911. Can repeat again in 15 min if still  unconscious.  Return in about 4 months (around 05/08/2023) for visit + labs before next visit.   I have reviewed current medications, nurse's notes, allergies, vital signs, past medical and surgical history, family medical history, and social history for this encounter. Counseled patient on symptoms, examination findings, lab findings, imaging results, treatment decisions and monitoring  and prognosis. The patient understood the recommendations and agrees with the treatment plan. All questions regarding treatment plan were fully answered.  Jennifer [place], MD  01/05/23    History of Present Illness Jennifer Molina is a 67 y.o. year old female who presents for evaluation of Type II diabetes mellitus.  Patient is accompanied by her daughter who is acting as her vietnamese+-other language translator   Jennifer Molina was first diagnosed in 2019.   Diabetes education +  Currently: Metformin XR 500 mg two pills daily, Farxiga 10 mg daily, glimepiride 2 mg after supper     Previous history: Previously used: Metformin, Amaryl, heartburn from [employer] Has not been on insulin except in the hospital in 2020   COMPLICATIONS -  MI/Stroke +  retinopathy, last eye exam 2024 +  neuropathy +  nephropathy  BLOOD SUGAR DATA Checks at night Rage 69-130  Physical Exam  BP 130/60   Pulse 83   Ht 5\' 3"  (1.6 m)   Wt 150 lb 9.6 oz (68.3 kg)   LMP  (LMP Unknown)   SpO2 93%   BMI 26.68 kg/m    Constitutional: well developed, well nourished Head: normocephalic, atraumatic Eyes: sclera anicteric, no redness Neck: supple Lungs: normal respiratory effort Neurology: alert and oriented Skin: dry, no appreciable rashes Musculoskeletal: no appreciable defects Psychiatric: normal mood and affect Diabetic Foot Exam - Simple   Simple Foot Form Diabetic Foot exam was performed with the following findings: Yes 01/05/2023  9:15 AM  Visual Inspection No deformities, no ulcerations, no other skin breakdown bilaterally: Yes Sensation Testing See comments: Yes Pulse Check Posterior Tibialis and Dorsalis pulse intact bilaterally: Yes Comments Monofilament 1/3 on right, 0/3 on left      Current Medications Patient's Medications  New Prescriptions   No medications on file  Previous Medications   ACETAMINOPHEN (TYLENOL) 500 MG TABLET    Take 1,000 mg by mouth every 6 (six) hours as needed  for mild pain or fever.   AMLODIPINE-OLMESARTAN (AZOR) 10-40 MG TABLET    Take 1 tablet by mouth daily.   ASPIRIN LOW DOSE 81 MG TABLET    TAKE 1 TABLET(81 MG) BY MOUTH DAILY.   ATORVASTATIN (LIPITOR) 40 MG TABLET    Take 1 tablet by mouth once daily   BLOOD GLUCOSE MONITORING SUPPL (ACCU-CHEK GUIDE ME) W/DEVICE KIT    Use as instructed to check blood sugars   BLOOD GLUCOSE MONITORING SUPPL (TRUE METRIX METER) W/DEVICE KIT    Use as directed   CARVEDILOL (COREG) 6.25 MG TABLET    Take 1 tablet (6.25 mg total) by mouth 2 (two) times daily.   CHLORTHALIDONE (HYGROTON) 50 MG TABLET    Take 1 tablet (50 mg total) by mouth daily.   DAPAGLIFLOZIN PROPANEDIOL (FARXIGA) 10 MG TABS TABLET    TAKE 1 TABLET(10 MG) BY MOUTH DAILY BEFORE BREAKFAST   FERROUS SULFATE (FEROSUL) 325 (65 FE) MG TABLET    TAKE 1 TABLET(325 MG) BY MOUTH DAILY   GABAPENTIN (NEURONTIN) 300 MG CAPSULE    Take 1 capsule (300 mg total) by mouth at bedtime.   GLIMEPIRIDE (AMARYL) 2 MG TABLET    Take 1  tablet (2 mg total) by mouth daily with supper.   GLUCOSE BLOOD (ACCU-CHEK GUIDE) TEST STRIP    1 each by Other route daily. And lancets 1/day   GLUCOSE BLOOD (TRUE METRIX BLOOD GLUCOSE TEST) TEST STRIP    Use as instructed   METFORMIN (GLUCOPHAGE-XR) 500 MG 24 HR TABLET    Take 2 tablets (1,000 mg total) by mouth daily.   TRUEPLUS LANCETS 28G MISC    Use as directed  Modified Medications   No medications on file  Discontinued Medications   No medications on file    Allergies No Known Allergies  Past Medical History Past Medical History:  Diagnosis Date   Dementia (HCC) 03/28/2019   DM (diabetes mellitus), type 2, uncontrolled 2015   Has poor vision.  Family not aware of A1C or what sugars run.  Believe not well controlled.  Not able to monitor due to cost of monitoring equipment   Hypertension 2015    Past Surgical History History reviewed. No pertinent surgical history.  Family History family history includes Diabetes in  her mother.  Social History Social History   Socioeconomic History   Marital status: Married    Spouse name: Vear Clock   Number of children: 7   Years of education: 3   Highest education level: 3rd grade  Occupational History   Occupation: Retired from farming in [place]  Tobacco Use   Smoking status: Never   Smokeless tobacco: Never  Vaping Use   Vaping Use: Never used  Substance and Sexual Activity   Alcohol use: Never   Drug use: Never   Sexual activity: Not on file  Other Topics Concern   Not on file  Social History Narrative   C[place] to [place] in [place]   Lives at home with husband   Her 2 daughters, 2 sons and their families   All together, 11 people in the home   Social Determinants of Health   Financial Resource Strain: Not on file  Food Insecurity: Not on file  Transportation Needs: Not on file  Physical Activity: Not on file  Stress: Not on file  Social Connections: Not on file  Intimate Partner Violence: Not on file    Lab Results  Component Value Date   HGBA1C 6.1 (A) 01/05/2023   Lab Results  Component Value Date   CHOL 157 06/19/2021   Lab Results  Component Value Date   HDL 41 06/19/2021   Lab Results  Component Value Date   LDLCALC 80 06/19/2021   Lab Results  Component Value Date   TRIG 218 (H) 06/19/2021   Lab Results  Component Value Date   CHOLHDL 3.8 06/19/2021   Lab Results  Component Value Date   CREATININE 1.42 (H) 11/20/2022   Lab Results  Component Value Date   GFR 37.99 (L) 11/20/2022   Lab Results  Component Value Date   MICROALBUR 11.8 (H) 09/23/2022      Component Value Date/Time   NA 136 11/20/2022 0805   NA 140 11/13/2021 1521   K 4.4 11/20/2022 0805   CL 107 11/20/2022 0805   CO2 25 11/20/2022 0805   GLUCOSE 230 (H) 11/20/2022 0805   BUN 30 (H) 11/20/2022 0805   BUN 31 (H) 11/13/2021 1521   CREATININE 1.42 (H) 11/20/2022 0805   CREATININE 1.49 (H) 10/28/2022 0859   CALCIUM 9.1 11/20/2022 0805   PROT  7.9 10/28/2022 0859   PROT 8.7 (H) 03/28/2019 1143   ALBUMIN 3.9 10/28/2022 0859  ALBUMIN 4.6 03/28/2019 1143   AST 9 (L) 10/28/2022 0859   ALT 11 10/28/2022 0859   ALKPHOS 39 10/28/2022 0859   BILITOT 0.4 10/28/2022 0859   GFRNONAA 38 (L) 10/28/2022 0859   GFRAA >60 07/25/2019 0412      Latest Ref Rng & Units 11/20/2022    8:05 AM 10/28/2022    8:59 AM 09/23/2022    8:15 AM  BMP  Glucose 70 - 99 mg/dL 161  096  045   BUN 6 - 23 mg/dL 30  34  29   Creatinine 0.40 - 1.20 mg/dL 4.09  8.11  9.14   Sodium 135 - 145 mEq/L 136  139  137   Potassium 3.5 - 5.1 mEq/L 4.4  4.3  4.6   Chloride 96 - 112 mEq/L 107  107  104   CO2 19 - 32 mEq/L 25  24  23    Calcium 8.4 - 10.5 mg/dL 9.1  8.7  9.2        Component Value Date/Time   WBC 6.1 10/28/2022 0859   WBC 7.8 10/02/2020 1504   RBC 3.52 (L) 10/28/2022 0859   HGB 9.1 (L) 10/28/2022 0859   HGB 12.2 03/28/2019 1143   HCT 28.5 (L) 10/28/2022 0859   HCT 37.4 03/28/2019 1143   PLT 237 10/28/2022 0859   PLT 295 03/28/2019 1143   MCV 81.0 10/28/2022 0859   MCV 83 03/28/2019 1143   MCH 25.9 (L) 10/28/2022 0859   MCHC 31.9 10/28/2022 0859   RDW 14.8 10/28/2022 0859   RDW 14.4 03/28/2019 1143   LYMPHSABS 1.8 10/28/2022 0859   LYMPHSABS 2.5 03/28/2019 1143   MONOABS 0.6 10/28/2022 0859   EOSABS 0.2 10/28/2022 0859   EOSABS 0.1 03/28/2019 1143   BASOSABS 0.0 10/28/2022 0859   BASOSABS 0.1 03/28/2019 1143     Parts of this note may have been dictated using voice recognition software. There may be variances in spelling and vocabulary which are unintentional. Not all errors are proofread. Please notify the Thereasa Parkin if any discrepancies are noted or if the meaning of any statement is not clear.

## 2023-02-06 ENCOUNTER — Other Ambulatory Visit: Payer: Self-pay | Admitting: Endocrinology

## 2023-02-23 ENCOUNTER — Telehealth: Payer: Self-pay

## 2023-02-23 NOTE — Telephone Encounter (Signed)
Pt's daughter called to reschedule pt's appointment. Daughter aware of new appt date and time.

## 2023-02-24 ENCOUNTER — Other Ambulatory Visit: Payer: Medicaid Other

## 2023-02-24 ENCOUNTER — Ambulatory Visit: Payer: Medicaid Other | Admitting: Hematology

## 2023-03-08 ENCOUNTER — Other Ambulatory Visit: Payer: Self-pay | Admitting: Hematology

## 2023-04-07 ENCOUNTER — Other Ambulatory Visit: Payer: Self-pay | Admitting: Hematology

## 2023-04-10 ENCOUNTER — Other Ambulatory Visit: Payer: Self-pay

## 2023-04-10 DIAGNOSIS — C9000 Multiple myeloma not having achieved remission: Secondary | ICD-10-CM

## 2023-04-13 ENCOUNTER — Inpatient Hospital Stay: Payer: Medicaid Other | Attending: Hematology

## 2023-04-13 ENCOUNTER — Inpatient Hospital Stay (HOSPITAL_BASED_OUTPATIENT_CLINIC_OR_DEPARTMENT_OTHER): Payer: Medicaid Other | Admitting: Hematology

## 2023-04-13 VITALS — BP 179/72 | HR 83 | Temp 97.5°F | Resp 20 | Wt 152.5 lb

## 2023-04-13 DIAGNOSIS — C9000 Multiple myeloma not having achieved remission: Secondary | ICD-10-CM

## 2023-04-13 DIAGNOSIS — R197 Diarrhea, unspecified: Secondary | ICD-10-CM | POA: Insufficient documentation

## 2023-04-13 DIAGNOSIS — D649 Anemia, unspecified: Secondary | ICD-10-CM | POA: Diagnosis not present

## 2023-04-13 DIAGNOSIS — I129 Hypertensive chronic kidney disease with stage 1 through stage 4 chronic kidney disease, or unspecified chronic kidney disease: Secondary | ICD-10-CM | POA: Diagnosis not present

## 2023-04-13 DIAGNOSIS — E1122 Type 2 diabetes mellitus with diabetic chronic kidney disease: Secondary | ICD-10-CM | POA: Diagnosis not present

## 2023-04-13 DIAGNOSIS — N189 Chronic kidney disease, unspecified: Secondary | ICD-10-CM | POA: Diagnosis not present

## 2023-04-13 DIAGNOSIS — D472 Monoclonal gammopathy: Secondary | ICD-10-CM | POA: Insufficient documentation

## 2023-04-13 LAB — CMP (CANCER CENTER ONLY)
ALT: 8 U/L (ref 0–44)
AST: 11 U/L — ABNORMAL LOW (ref 15–41)
Albumin: 3.7 g/dL (ref 3.5–5.0)
Alkaline Phosphatase: 33 U/L — ABNORMAL LOW (ref 38–126)
Anion gap: 6 (ref 5–15)
BUN: 24 mg/dL — ABNORMAL HIGH (ref 8–23)
CO2: 24 mmol/L (ref 22–32)
Calcium: 9 mg/dL (ref 8.9–10.3)
Chloride: 108 mmol/L (ref 98–111)
Creatinine: 1.35 mg/dL — ABNORMAL HIGH (ref 0.44–1.00)
GFR, Estimated: 43 mL/min — ABNORMAL LOW (ref >60)
Glucose, Bld: 233 mg/dL — ABNORMAL HIGH (ref 70–99)
Potassium: 4.2 mmol/L (ref 3.5–5.1)
Sodium: 138 mmol/L (ref 135–145)
Total Bilirubin: 0.4 mg/dL (ref 0.3–1.2)
Total Protein: 7.5 g/dL (ref 6.5–8.1)

## 2023-04-13 LAB — IRON AND IRON BINDING CAPACITY (CC-WL,HP ONLY)
Iron: 74 ug/dL (ref 28–170)
Saturation Ratios: 27 % (ref 10.4–31.8)
TIBC: 272 ug/dL (ref 250–450)
UIBC: 198 ug/dL (ref 148–442)

## 2023-04-13 LAB — CBC WITH DIFFERENTIAL (CANCER CENTER ONLY)
Abs Immature Granulocytes: 0.01 10*3/uL (ref 0.00–0.07)
Basophils Absolute: 0 10*3/uL (ref 0.0–0.1)
Basophils Relative: 1 %
Eosinophils Absolute: 0.2 10*3/uL (ref 0.0–0.5)
Eosinophils Relative: 4 %
HCT: 26.3 % — ABNORMAL LOW (ref 36.0–46.0)
Hemoglobin: 8.8 g/dL — ABNORMAL LOW (ref 12.0–15.0)
Immature Granulocytes: 0 %
Lymphocytes Relative: 28 %
Lymphs Abs: 1.3 10*3/uL (ref 0.7–4.0)
MCH: 26.7 pg (ref 26.0–34.0)
MCHC: 33.5 g/dL (ref 30.0–36.0)
MCV: 79.9 fL — ABNORMAL LOW (ref 80.0–100.0)
Monocytes Absolute: 0.4 10*3/uL (ref 0.1–1.0)
Monocytes Relative: 9 %
Neutro Abs: 2.6 10*3/uL (ref 1.7–7.7)
Neutrophils Relative %: 58 %
Platelet Count: 179 10*3/uL (ref 150–400)
RBC: 3.29 MIL/uL — ABNORMAL LOW (ref 3.87–5.11)
RDW: 15 % (ref 11.5–15.5)
WBC Count: 4.5 10*3/uL (ref 4.0–10.5)
nRBC: 0 % (ref 0.0–0.2)

## 2023-04-13 LAB — FERRITIN: Ferritin: 196 ng/mL (ref 11–307)

## 2023-04-13 MED ORDER — POLYSACCHARIDE IRON COMPLEX 150 MG PO CAPS: 150.0000 mg | ORAL_CAPSULE | Freq: Every day | ORAL | 3 refills | Status: DC

## 2023-04-13 NOTE — Progress Notes (Signed)
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 Interpretation was provided by an interpreter.   Patient was last seen by me on 10/28/2022 and she complained of continuous neck pain.  Patient is accompanied by her daughter during this visit. Patient notes she has been doing fairly well since our last visit. She complains of bilateral leg swelling for the past 1-2 months and fatigue. Patient notes that her leg swelling improves when she sleeps. She has fatigue when walking long distance.   She denies any new infection issues, fever, chills, night sweats, unexpected weight loss, abnormal bowel movements, abnormal bleeding, abdominal pain, chest pain, back pain, or leg swelling. Her daughter notes that she occasionally has nose bleeds, which does not stop quickly. Patient does complains of occasional memory problem.  Patient recently had an eye surgery and receives a injection every month.   Patient notes she previously received IV Iron infusions and had mild toxicity of diarrhea.    MEDICAL HISTORY:  Past Medical History:  Diagnosis Date   Dementia (HCC) 03/28/2019   DM (diabetes mellitus), type 2, uncontrolled 2015   Has poor vision.  Family not aware of A1C or what sugars run.  Believe not well controlled.  Not able to monitor due to cost of monitoring equipment   Hypertension 2015     SURGICAL HISTORY: No past surgical history on file.  SOCIAL HISTORY: Social History   Socioeconomic History   Marital status: Married    Spouse name: Vear Clock   Number of children: 7   Years of education: 3   Highest education level: 3rd grade  Occupational History   Occupation: Retired from farming in [place]  Tobacco Use   Smoking status: Never   Smokeless tobacco: Never  Vaping Use   Vaping status: Never Used  Substance and Sexual Activity   Alcohol use: Never   Drug use: Never   Sexual activity: Not on file  Other Topics Concern   Not on file  Social History Narrative   [place] in [place]   Lives at home with husband   Her 2 daughters, 2 sons and their families   All together, 11 people in the home   Social Determinants of Health   Financial Resource Strain: Not on file  Food Insecurity: Not on file  Transportation Needs: Not on file  Physical Activity: Not on file  Stress: Not on file  Social Connections: Unknown (12/31/2021)   Received [facility]   Social Network    Social Network: Not on file  Intimate Partner Violence: Unknown (11/22/2021)   Received [facility]   HITS    Physically Hurt: Not on file    Insult or Talk Down To: Not on file    Threaten Physical Harm: Not on file    Scream or Curse: Not on file    FAMILY HISTORY: Family History  Problem Relation Age of Onset  Diabetes Mother    Diabetes Mellitus II Neg Hx     ALLERGIES:  has No Known Allergies.  MEDICATIONS:  Current Outpatient Medications  Medication Sig Dispense Refill   acetaminophen (TYLENOL) 500 MG tablet Take 1,000 mg by mouth every 6 (six) hours as needed for mild pain or fever.     amLODipine-olmesartan (AZOR) 10-40 MG tablet Take 1 tablet by mouth daily.     ASPIRIN LOW DOSE 81 MG tablet TAKE 1 TABLET(81 MG) BY MOUTH DAILY 30 tablet 2   atorvastatin (LIPITOR) 40 MG tablet Take 1 tablet by mouth once daily 30 tablet 0    Blood Glucose Monitoring Suppl (ACCU-CHEK GUIDE ME) w/Device KIT Use as instructed to check blood sugars 1 kit 0   Blood Glucose Monitoring Suppl (TRUE METRIX METER) w/Device KIT Use as directed 1 kit 0   carvedilol (COREG) 6.25 MG tablet Take 1 tablet (6.25 mg total) by mouth 2 (two) times daily. 180 tablet 3   chlorthalidone (HYGROTON) 50 MG tablet Take 1 tablet (50 mg total) by mouth daily. 90 tablet 3   dapagliflozin propanediol (FARXIGA) 10 MG TABS tablet TAKE 1 TABLET(10 MG) BY MOUTH DAILY BEFORE BREAKFAST 90 tablet 3   ferrous sulfate (FEROSUL) 325 (65 FE) MG tablet TAKE 1 TABLET(325 MG) BY MOUTH DAILY 30 tablet 5   gabapentin (NEURONTIN) 300 MG capsule TAKE 1 CAPSULE(300 MG) BY MOUTH AT BEDTIME 30 capsule 5   glimepiride (AMARYL) 2 MG tablet TAKE 1 TABLET(2 MG) BY MOUTH DAILY WITH SUPPER 30 tablet 3   glucose blood (ACCU-CHEK GUIDE) test strip 1 each by Other route daily. And lancets 1/day 100 each 3   glucose blood (TRUE METRIX BLOOD GLUCOSE TEST) test strip Use as instructed 100 each 12   metFORMIN (GLUCOPHAGE-XR) 500 MG 24 hr tablet Take 2 tablets (1,000 mg total) by mouth daily. 180 tablet 3   TRUEplus Lancets 28G MISC Use as directed 100 each 4   No current facility-administered medications for this visit.    REVIEW OF SYSTEMS:   .10 Point review of Systems was done is negative except as noted above.  PHYSICAL EXAMINATION: .BP (!) 179/72   Pulse 83   Temp (!) 97.5 F (36.4 C)   Resp 20   Wt 152 lb 8 oz (69.2 kg)   LMP  (LMP Unknown)   SpO2 94%   BMI 27.01 kg/m  . GENERAL:alert, in no acute distress and comfortable SKIN: no acute rashes, no significant lesions EYES: conjunctiva are pink and non-injected, sclera anicteric OROPHARYNX: MMM, no exudates, no oropharyngeal erythema or ulceration NECK: supple, no JVD LYMPH:  no palpable lymphadenopathy in the cervical, axillary or inguinal regions LUNGS: clear to auscultation b/l with normal respiratory effort HEART:  regular rate & rhythm ABDOMEN:  normoactive bowel sounds , non tender, not distended. Extremity: no pedal edema PSYCH: alert & oriented x 3 with fluent speech NEURO: no focal motor/sensory deficits   LABORATORY DATA:  I have reviewed the data as listed  .    Latest Ref Rng & Units 04/13/2023    9:50 AM 10/28/2022    8:59 AM 04/15/2022    9:27 AM  CBC  WBC 4.0 - 10.5 K/uL 4.5  6.1  5.8   Hemoglobin 12.0 - 15.0 g/dL 8.8  9.1  9.3   Hematocrit 36.0 - 46.0 % 26.3  28.5  28.3   Platelets 150 - 400 K/uL 179  237  236    CBC    Component Value  Date/Time   WBC 4.5 04/13/2023 0950   WBC 7.8 10/02/2020 1504   RBC 3.29 (L) 04/13/2023 0950   HGB 8.8 (L) 04/13/2023 0950   HGB 12.2 03/28/2019 1143   HCT 26.3 (L) 04/13/2023 0950   HCT 37.4 03/28/2019 1143   PLT 179 04/13/2023 0950   PLT 295 03/28/2019 1143   MCV 79.9 (L) 04/13/2023 0950   MCV 83 03/28/2019 1143   MCH 26.7 04/13/2023 0950   MCHC 33.5 04/13/2023 0950   RDW 15.0 04/13/2023 0950   RDW 14.4 03/28/2019 1143   LYMPHSABS 1.3 04/13/2023 0950   LYMPHSABS 2.5 03/28/2019 1143   MONOABS 0.4 04/13/2023 0950   EOSABS 0.2 04/13/2023 0950   EOSABS 0.1 03/28/2019 1143   BASOSABS 0.0 04/13/2023 0950   BASOSABS 0.1 03/28/2019 1143       Latest Ref Rng & Units 04/13/2023    9:50 AM 11/20/2022    8:05 AM 10/28/2022    8:59 AM  CMP  Glucose 70 - 99 mg/dL 128  118  867   BUN 8 - 23 mg/dL 24  30  34   Creatinine 0.44 - 1.00 mg/dL 7.37  3.66  8.15   Sodium 135 - 145 mmol/L 138  136  139   Potassium 3.5 - 5.1 mmol/L 4.2  4.4  4.3   Chloride 98 - 111 mmol/L 108  107  107   CO2 22 - 32 mmol/L 24  25  24    Calcium 8.9 - 10.3 mg/dL 9.0  9.1  8.7   Total Protein 6.5 - 8.1 g/dL 7.5   7.9   Total Bilirubin 0.3 - 1.2 mg/dL 0.4   0.4   Alkaline Phos 38 - 126 U/L 33   39   AST 15 - 41 U/L 11   9   ALT 0 - 44 U/L 8   11    . Lab Results  Component Value Date   IRON 74 04/13/2023   TIBC 272 04/13/2023   IRONPCTSAT 27 04/13/2023    (Iron and TIBC)  Lab Results  Component Value Date   FERRITIN 196 04/13/2023     09/24/2020 Surgical Pathology DIAGNOSIS:   BONE MARROW, ASPIRATE, CLOT, CORE:  -Slightly hypercellular bone marrow for age with plasma cell neoplasm  -See comment   PERIPHERAL BLOOD:  -Normocytic-normochromic anemia   COMMENT:   The bone marrow is hypercellular for age with slight increase in plasma  cells representing 8% of all cells in the aspirate with lack of large  aggregates or sheets.  Immunohistochemical stains highlight the  increased plasma cell component in the clot/biopsy sections correlating  with the aspirate.  The plasma cells display kappa light chain  restriction consistent with plasma cell neoplasm.  Correlation with  cytogenetic and FISH studies recommended.   09/24/2020 Molecular Pathology    09/24/2020 Cytogenetics Report    RADIOGRAPHIC STUDIES: I have personally reviewed the radiological images as listed and agreed with the findings in the report. No results found.   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 Iron levels adequate. Will likely need consideration of erythropoietin if hemoglobin remains less than 9 and hypertension is well controlled.  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 Unlikely to be related to her monoclonal paraproteinemia-follows with Dr. Nance Pear completely rule  out possibility of light chain deposition disease or AL amyloidosis. We will defer to nephrology regarding need for kidney biopsy to rule out these possibilities if indicated.  PLAN -Discussed lab results from today, 04/13/2023, with the patient. CBC shows patient is anemic with hemoglobin of 8.8 g/dL and decreased hematocrit of 26.3%. CMP shows elevated  glucose level at 233, slightly elevated creatinine level of 1.35, decreased alkaline phosphate level at 33, and slightly decreased AST level of 11. -Advised to control salt intake and wear compression socks to help with bilateral leg edema.  -Discussed the reasons for anemia which is not primarily related to MGUS. M spike still fluctuating aroud 1.3- 1.8. It is 1.8g/dl today. -Discussed the option of 3 doses of IV Iron or starting Iron supplement. Patient wants to try po Iron supplement since IV Iron causes her diarrhea.  -Will prescribe iron polysaccharide.   -Recommend to follow-up with her PCP regarding elevated glucose levels.   -Recommend to use Afrin nasal spray for epistaxis as instructed.  -Answered all of patient's and her daughter's questions.  -Patient has no clinical or lab findings suggestive of progression to multiple myeloma at this time.   FOLLOW-UP: RTC with Dr Candise Che with labs in 4 months  The total time spent in the appointment was 20 minutes* .  All of the patient's questions were answered with apparent satisfaction. The patient knows to call the clinic with any problems, questions or concerns.   Wyvonnia Lora MD MS AAHIVMS [facility] Hematology/Oncology Physician [facility]  .*Total Encounter Time as defined by the Centers for Medicare and Medicaid Services includes, in addition to the face-to-face time of a patient visit (documented in the note above) non-face-to-face time: obtaining and reviewing outside history, ordering and reviewing medications, tests or procedures, care coordination (communications with other health care professionals or caregivers) and documentation in the medical record.   I,Param Shah,acting as a Neurosurgeon for Wyvonnia Lora, MD.,have documented all relevant documentation on the behalf of Wyvonnia Lora, MD,as directed by  Wyvonnia Lora, MD while in the presence of Wyvonnia Lora, MD.  .I have reviewed the above documentation for accuracy and  completeness, and I agree with the above. Johney Maine MD

## 2023-04-15 LAB — KAPPA/LAMBDA LIGHT CHAINS
Kappa free light chain: 44.4 mg/L — ABNORMAL HIGH (ref 3.3–19.4)
Kappa, lambda light chain ratio: 2.6 — ABNORMAL HIGH (ref 0.26–1.65)
Lambda free light chains: 17.1 mg/L (ref 5.7–26.3)

## 2023-04-17 LAB — MULTIPLE MYELOMA PANEL, SERUM
Albumin SerPl Elph-Mcnc: 3.2 g/dL (ref 2.9–4.4)
Albumin/Glob SerPl: 0.9 (ref 0.7–1.7)
Alpha 1: 0.2 g/dL (ref 0.0–0.4)
Alpha2 Glob SerPl Elph-Mcnc: 0.7 g/dL (ref 0.4–1.0)
B-Globulin SerPl Elph-Mcnc: 0.9 g/dL (ref 0.7–1.3)
Gamma Glob SerPl Elph-Mcnc: 2 g/dL — ABNORMAL HIGH (ref 0.4–1.8)
Globulin, Total: 3.8 g/dL (ref 2.2–3.9)
IgA: 132 mg/dL (ref 87–352)
IgG (Immunoglobin G), Serum: 1993 mg/dL — ABNORMAL HIGH (ref 586–1602)
IgM (Immunoglobulin M), Srm: 60 mg/dL (ref 26–217)
M Protein SerPl Elph-Mcnc: 1.8 g/dL — ABNORMAL HIGH
Total Protein ELP: 7 g/dL (ref 6.0–8.5)

## 2023-05-04 ENCOUNTER — Other Ambulatory Visit (INDEPENDENT_AMBULATORY_CARE_PROVIDER_SITE_OTHER): Payer: Medicaid Other

## 2023-05-04 DIAGNOSIS — E1165 Type 2 diabetes mellitus with hyperglycemia: Secondary | ICD-10-CM

## 2023-05-04 LAB — COMPREHENSIVE METABOLIC PANEL
ALT: 7 U/L (ref 0–35)
AST: 11 U/L (ref 0–37)
Albumin: 3.3 g/dL — ABNORMAL LOW (ref 3.5–5.2)
Alkaline Phosphatase: 30 U/L — ABNORMAL LOW (ref 39–117)
BUN: 21 mg/dL (ref 6–23)
CO2: 24 meq/L (ref 19–32)
Calcium: 8.7 mg/dL (ref 8.4–10.5)
Chloride: 108 meq/L (ref 96–112)
Creatinine, Ser: 1.53 mg/dL — ABNORMAL HIGH (ref 0.40–1.20)
GFR: 34.63 mL/min — ABNORMAL LOW (ref >60.00)
Glucose, Bld: 111 mg/dL — ABNORMAL HIGH (ref 70–99)
Potassium: 3.9 meq/L (ref 3.5–5.1)
Sodium: 140 meq/L (ref 135–145)
Total Bilirubin: 0.4 mg/dL (ref 0.2–1.2)
Total Protein: 6.7 g/dL (ref 6.0–8.3)

## 2023-05-07 LAB — FRUCTOSAMINE: Fructosamine: 277 umol/L (ref 205–285)

## 2023-05-11 ENCOUNTER — Ambulatory Visit: Payer: Medicaid Other | Admitting: "Endocrinology

## 2023-05-29 ENCOUNTER — Ambulatory Visit: Payer: Medicaid Other | Admitting: "Endocrinology

## 2023-06-08 IMAGING — MG MM DIGITAL SCREENING BILAT W/ TOMO AND CAD
8 series · 8 of 24 positions shown · non-contrast
Comparison: None.

CLINICAL DATA: Screening.

EXAM:
DIGITAL SCREENING BILATERAL MAMMOGRAM WITH TOMOSYNTHESIS AND CAD
TECHNIQUE: Bilateral screening digital craniocaudal and mediolateral oblique
mammograms were obtained. Bilateral screening digital breast
tomosynthesis was performed. The images were evaluated with
computer-aided detection.

[L MLO synth-2D]
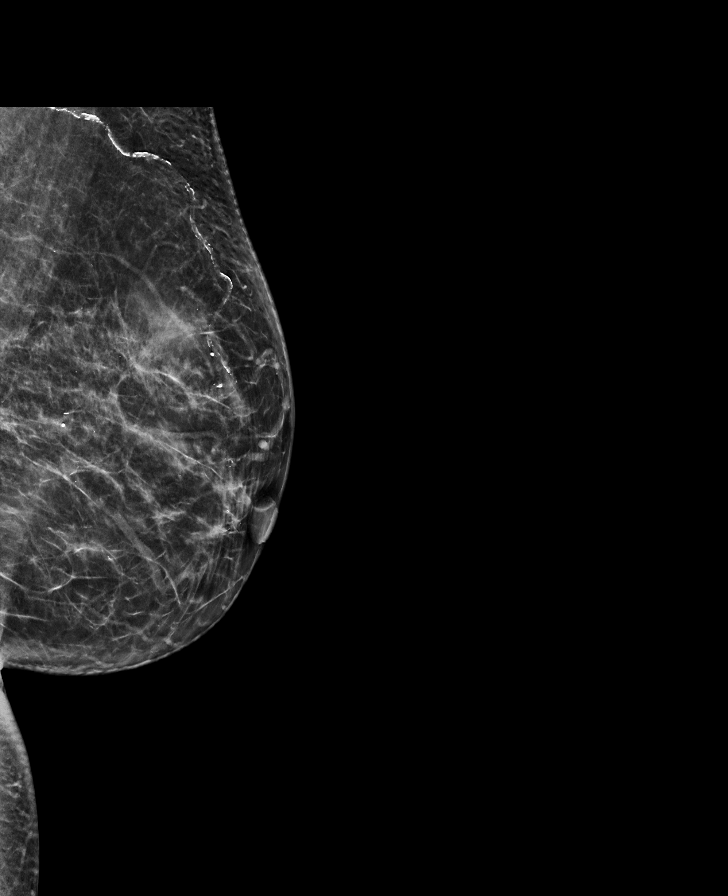

[R CC synth-2D]
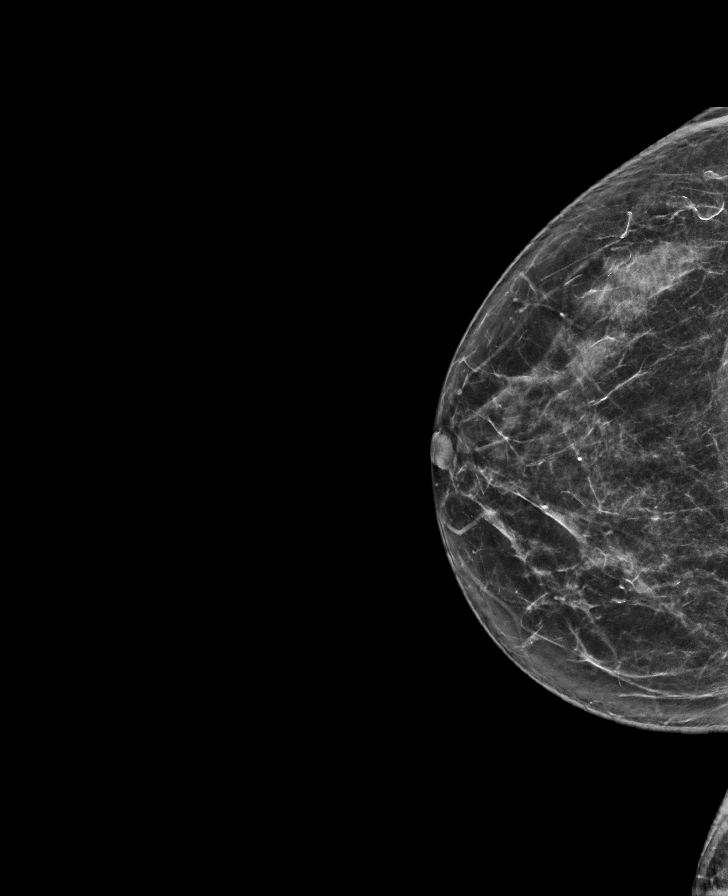

[L CC synth-2D]
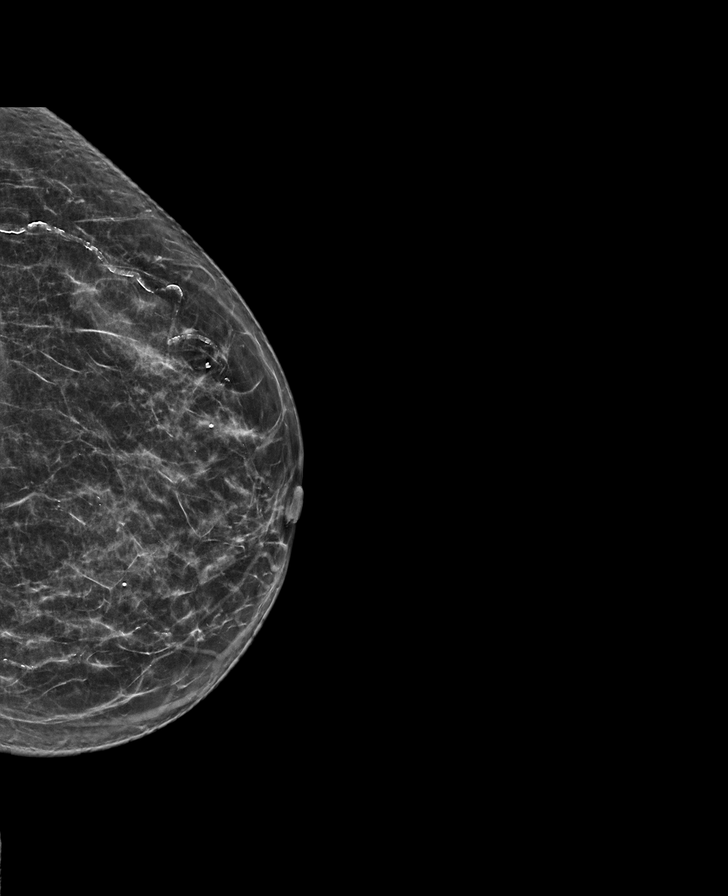

[R MLO synth-2D]
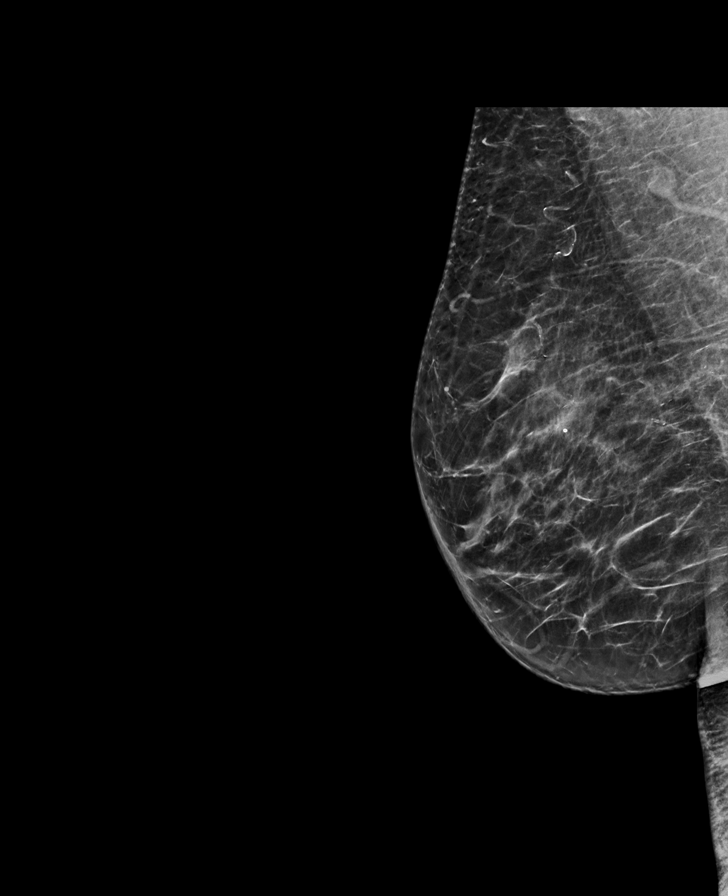

[L MLO tomo · tomo slice 34/67.0]
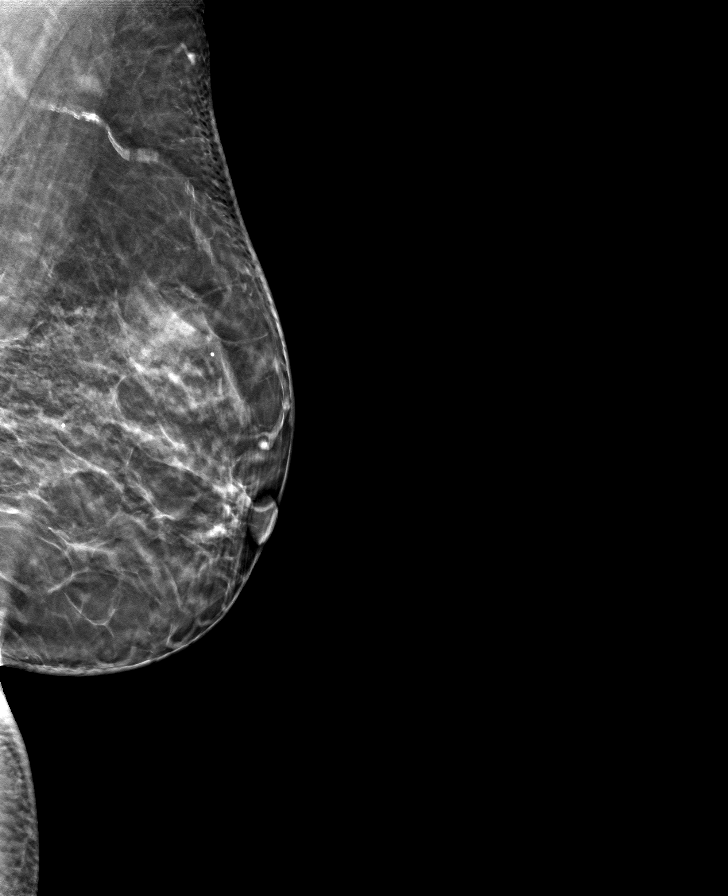

[L CC tomo · tomo slice 32/63.0]
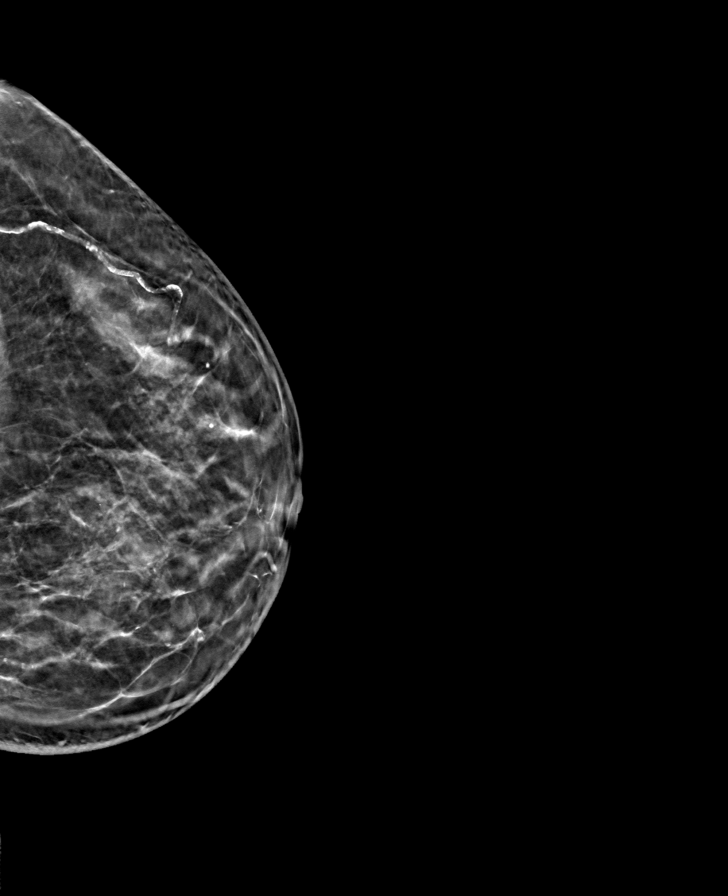

[R CC tomo · tomo slice 35/69.0]
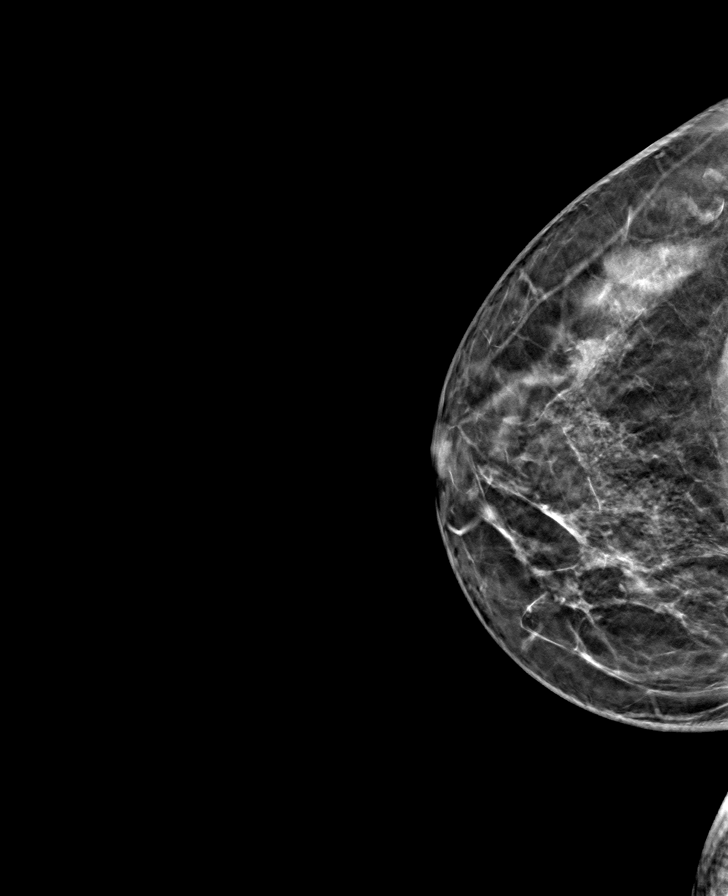

[R MLO tomo · tomo slice 37/72.0]
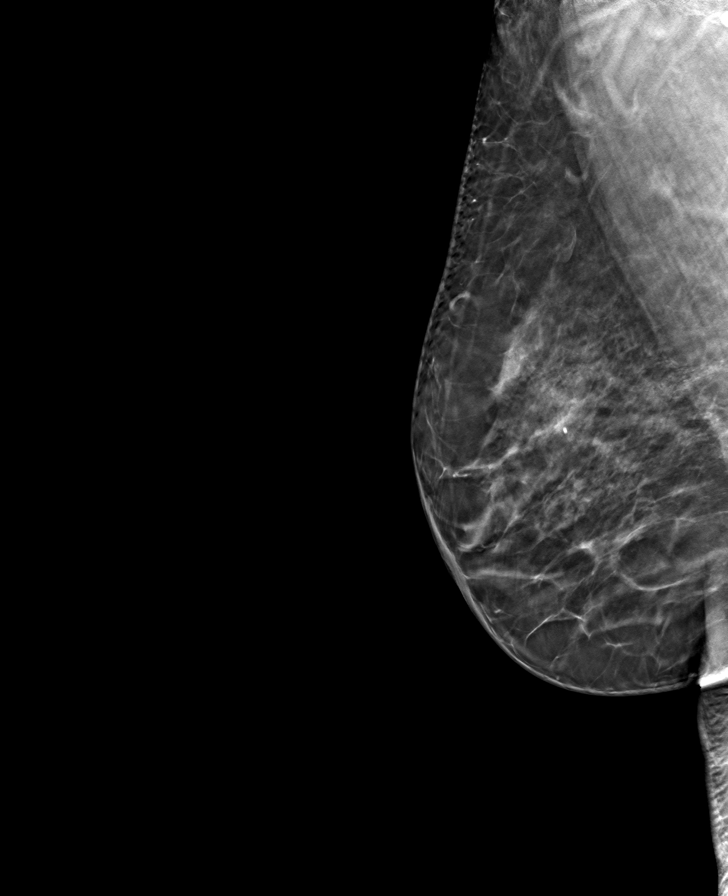

[8 of 24 positions shown; findings below may reference images not displayed]

ACR Breast Density Category c: The breast tissue is heterogeneously
dense, which may obscure small masses
FINDINGS: There are no findings suspicious for malignancy.
IMPRESSION: No mammographic evidence of malignancy. A result letter of this
screening mammogram will be mailed directly to the patient.

RECOMMENDATION:
Screening mammogram in one year. (Code:C8-T-HNK)

BI-RADS CATEGORY  1: Negative.

## 2023-07-30 ENCOUNTER — Emergency Department (HOSPITAL_COMMUNITY): Payer: Medicaid Other

## 2023-07-30 ENCOUNTER — Encounter (HOSPITAL_COMMUNITY): Payer: Self-pay | Admitting: Emergency Medicine

## 2023-07-30 ENCOUNTER — Other Ambulatory Visit: Payer: Self-pay

## 2023-07-30 ENCOUNTER — Inpatient Hospital Stay (HOSPITAL_COMMUNITY)
Admission: EM | Admit: 2023-07-30 | Discharge: 2023-08-03 | DRG: 871 | Disposition: A | Discharge status: Home / Self Care | Payer: Medicaid Other | Attending: Internal Medicine | Admitting: Internal Medicine

## 2023-07-30 DIAGNOSIS — R109 Unspecified abdominal pain: Secondary | ICD-10-CM | POA: Diagnosis present

## 2023-07-30 DIAGNOSIS — F0280 Dementia in other diseases classified elsewhere without behavioral disturbance: Secondary | ICD-10-CM | POA: Diagnosis not present

## 2023-07-30 DIAGNOSIS — J9601 Acute respiratory failure with hypoxia: Secondary | ICD-10-CM | POA: Diagnosis present

## 2023-07-30 DIAGNOSIS — G309 Alzheimer's disease, unspecified: Secondary | ICD-10-CM | POA: Diagnosis not present

## 2023-07-30 DIAGNOSIS — E1165 Type 2 diabetes mellitus with hyperglycemia: Secondary | ICD-10-CM | POA: Diagnosis present

## 2023-07-30 DIAGNOSIS — E871 Hypo-osmolality and hyponatremia: Secondary | ICD-10-CM | POA: Insufficient documentation

## 2023-07-30 DIAGNOSIS — D472 Monoclonal gammopathy: Secondary | ICD-10-CM | POA: Insufficient documentation

## 2023-07-30 DIAGNOSIS — I5032 Chronic diastolic (congestive) heart failure: Secondary | ICD-10-CM | POA: Diagnosis present

## 2023-07-30 DIAGNOSIS — J189 Pneumonia, unspecified organism: Secondary | ICD-10-CM | POA: Diagnosis not present

## 2023-07-30 DIAGNOSIS — B9789 Other viral agents as the cause of diseases classified elsewhere: Secondary | ICD-10-CM | POA: Diagnosis present

## 2023-07-30 DIAGNOSIS — E785 Hyperlipidemia, unspecified: Secondary | ICD-10-CM | POA: Diagnosis present

## 2023-07-30 DIAGNOSIS — I10 Essential (primary) hypertension: Secondary | ICD-10-CM | POA: Diagnosis not present

## 2023-07-30 DIAGNOSIS — F0390 Unspecified dementia without behavioral disturbance: Secondary | ICD-10-CM | POA: Diagnosis present

## 2023-07-30 DIAGNOSIS — N1832 Chronic kidney disease, stage 3b: Secondary | ICD-10-CM | POA: Diagnosis present

## 2023-07-30 DIAGNOSIS — I3139 Other pericardial effusion (noninflammatory): Secondary | ICD-10-CM | POA: Diagnosis present

## 2023-07-30 DIAGNOSIS — Z1152 Encounter for screening for COVID-19: Secondary | ICD-10-CM | POA: Diagnosis not present

## 2023-07-30 DIAGNOSIS — H547 Unspecified visual loss: Secondary | ICD-10-CM | POA: Diagnosis present

## 2023-07-30 DIAGNOSIS — D631 Anemia in chronic kidney disease: Secondary | ICD-10-CM | POA: Diagnosis present

## 2023-07-30 DIAGNOSIS — Z7984 Long term (current) use of oral hypoglycemic drugs: Secondary | ICD-10-CM

## 2023-07-30 DIAGNOSIS — I5A Non-ischemic myocardial injury (non-traumatic): Secondary | ICD-10-CM | POA: Insufficient documentation

## 2023-07-30 DIAGNOSIS — R1110 Vomiting, unspecified: Secondary | ICD-10-CM | POA: Diagnosis present

## 2023-07-30 DIAGNOSIS — Z833 Family history of diabetes mellitus: Secondary | ICD-10-CM

## 2023-07-30 DIAGNOSIS — I130 Hypertensive heart and chronic kidney disease with heart failure and stage 1 through stage 4 chronic kidney disease, or unspecified chronic kidney disease: Secondary | ICD-10-CM | POA: Diagnosis present

## 2023-07-30 DIAGNOSIS — Z79899 Other long term (current) drug therapy: Secondary | ICD-10-CM

## 2023-07-30 DIAGNOSIS — N179 Acute kidney failure, unspecified: Secondary | ICD-10-CM | POA: Diagnosis present

## 2023-07-30 DIAGNOSIS — E1122 Type 2 diabetes mellitus with diabetic chronic kidney disease: Secondary | ICD-10-CM | POA: Diagnosis present

## 2023-07-30 DIAGNOSIS — R6520 Severe sepsis without septic shock: Secondary | ICD-10-CM | POA: Diagnosis not present

## 2023-07-30 DIAGNOSIS — E861 Hypovolemia: Secondary | ICD-10-CM | POA: Diagnosis present

## 2023-07-30 DIAGNOSIS — R0603 Acute respiratory distress: Secondary | ICD-10-CM | POA: Diagnosis not present

## 2023-07-30 DIAGNOSIS — J159 Unspecified bacterial pneumonia: Secondary | ICD-10-CM | POA: Diagnosis present

## 2023-07-30 DIAGNOSIS — A419 Sepsis, unspecified organism: Secondary | ICD-10-CM | POA: Diagnosis not present

## 2023-07-30 DIAGNOSIS — J1289 Other viral pneumonia: Secondary | ICD-10-CM | POA: Diagnosis present

## 2023-07-30 DIAGNOSIS — Z7982 Long term (current) use of aspirin: Secondary | ICD-10-CM

## 2023-07-30 HISTORY — DX: Chronic kidney disease, unspecified: N18.9

## 2023-07-30 LAB — CBC
HCT: 24.9 % — ABNORMAL LOW (ref 36.0–46.0)
Hemoglobin: 8.2 g/dL — ABNORMAL LOW (ref 12.0–15.0)
MCH: 24.8 pg — ABNORMAL LOW (ref 26.0–34.0)
MCHC: 32.9 g/dL (ref 30.0–36.0)
MCV: 75.5 fL — ABNORMAL LOW (ref 80.0–100.0)
Platelets: 368 10*3/uL (ref 150–400)
RBC: 3.3 MIL/uL — ABNORMAL LOW (ref 3.87–5.11)
RDW: 15.5 % (ref 11.5–15.5)
WBC: 12.7 10*3/uL — ABNORMAL HIGH (ref 4.0–10.5)
nRBC: 0 % (ref 0.0–0.2)

## 2023-07-30 LAB — RESPIRATORY PANEL BY PCR

## 2023-07-30 LAB — RESP PANEL BY RT-PCR (RSV, FLU A&B, COVID)  RVPGX2
Influenza A by PCR: NEGATIVE
Influenza B by PCR: NEGATIVE
Resp Syncytial Virus by PCR: NEGATIVE
SARS Coronavirus 2 by RT PCR: NEGATIVE

## 2023-07-30 LAB — COMPREHENSIVE METABOLIC PANEL
ALT: 15 U/L (ref 0–44)
AST: 21 U/L (ref 15–41)
Albumin: 1.9 g/dL — ABNORMAL LOW (ref 3.5–5.0)
Alkaline Phosphatase: 51 U/L (ref 38–126)
Anion gap: 9 (ref 5–15)
BUN: 20 mg/dL (ref 8–23)
CO2: 22 mmol/L (ref 22–32)
Calcium: 8.1 mg/dL — ABNORMAL LOW (ref 8.9–10.3)
Chloride: 100 mmol/L (ref 98–111)
Creatinine, Ser: 1.94 mg/dL — ABNORMAL HIGH (ref 0.44–1.00)
GFR, Estimated: 28 mL/min — ABNORMAL LOW (ref >60)
Glucose, Bld: 149 mg/dL — ABNORMAL HIGH (ref 70–99)
Potassium: 3.7 mmol/L (ref 3.5–5.1)
Sodium: 131 mmol/L — ABNORMAL LOW (ref 135–145)
Total Bilirubin: 0.6 mg/dL (ref <1.2)
Total Protein: 6.4 g/dL — ABNORMAL LOW (ref 6.5–8.1)

## 2023-07-30 LAB — I-STAT CG4 LACTIC ACID, ED: Lactic Acid, Venous: 0.7 mmol/L (ref 0.5–1.9)

## 2023-07-30 LAB — URINALYSIS, W/ REFLEX TO CULTURE (INFECTION SUSPECTED)
Bilirubin Urine: NEGATIVE
Glucose, UA: 50 mg/dL — AB
Ketones, ur: NEGATIVE mg/dL
Leukocytes,Ua: NEGATIVE
Nitrite: NEGATIVE
Protein, ur: >=300 mg/dL — AB
Specific Gravity, Urine: 1.011 (ref 1.005–1.030)
pH: 6 (ref 5.0–8.0)

## 2023-07-30 LAB — TROPONIN I (HIGH SENSITIVITY)
Troponin I (High Sensitivity): 87 ng/L — ABNORMAL HIGH (ref <18)
Troponin I (High Sensitivity): 90 ng/L — ABNORMAL HIGH (ref <18)

## 2023-07-30 LAB — GLUCOSE, CAPILLARY: Glucose-Capillary: 150 mg/dL — ABNORMAL HIGH (ref 70–99)

## 2023-07-30 LAB — LIPASE, BLOOD: Lipase: 31 U/L (ref 11–51)

## 2023-07-30 MED ORDER — ONDANSETRON HCL 4 MG/2ML IJ SOLN: 4.0000 mg | Freq: Four times a day (QID) | INTRAMUSCULAR | Status: DC | PRN

## 2023-07-30 MED ORDER — ATORVASTATIN CALCIUM 40 MG PO TABS
40.0000 mg | ORAL_TABLET | Freq: Every day | ORAL | Status: DC
  Administered 2023-07-30 – 2023-08-03 (×5): 40 mg via ORAL
  Filled 2023-07-30 (×5): qty 1

## 2023-07-30 MED ORDER — ACETAMINOPHEN 325 MG PO TABS
650.0000 mg | ORAL_TABLET | Freq: Four times a day (QID) | ORAL | Status: DC | PRN
  Administered 2023-08-03: 650 mg via ORAL
  Filled 2023-07-30: qty 2

## 2023-07-30 MED ORDER — INSULIN ASPART 100 UNIT/ML IJ SOLN: 0.0000 [IU] | Freq: Every day | INTRAMUSCULAR | Status: DC

## 2023-07-30 MED ORDER — GABAPENTIN 300 MG PO CAPS
300.0000 mg | ORAL_CAPSULE | Freq: Every day | ORAL | Status: DC
  Administered 2023-07-30 – 2023-08-02 (×4): 300 mg via ORAL
  Filled 2023-07-30 (×4): qty 1

## 2023-07-30 MED ORDER — ACETAMINOPHEN 650 MG RE SUPP: 650.0000 mg | Freq: Four times a day (QID) | RECTAL | Status: DC | PRN

## 2023-07-30 MED ORDER — CHLORTHALIDONE 25 MG PO TABS
50.0000 mg | ORAL_TABLET | Freq: Every day | ORAL | Status: DC
Start: 2023-07-30 — End: 2023-08-01
  Administered 2023-07-30 – 2023-07-31 (×2): 50 mg via ORAL
  Filled 2023-07-30 (×4): qty 2

## 2023-07-30 MED ORDER — ASPIRIN 81 MG PO CHEW
324.0000 mg | CHEWABLE_TABLET | Freq: Once | ORAL | Status: AC
  Administered 2023-07-30: 324 mg via ORAL
  Filled 2023-07-30: qty 4

## 2023-07-30 MED ORDER — SODIUM CHLORIDE 0.9 % IV SOLN
1.0000 g | Freq: Once | INTRAVENOUS | Status: AC
Start: 2023-07-30 — End: 2023-07-30
  Administered 2023-07-30: 1 g via INTRAVENOUS
  Filled 2023-07-30: qty 10

## 2023-07-30 MED ORDER — SODIUM CHLORIDE 0.9 % IV SOLN
500.0000 mg | INTRAVENOUS | Status: AC
  Administered 2023-07-31 – 2023-08-02 (×3): 500 mg via INTRAVENOUS
  Filled 2023-07-30 (×3): qty 5

## 2023-07-30 MED ORDER — ACETAMINOPHEN 325 MG PO TABS
650.0000 mg | ORAL_TABLET | Freq: Once | ORAL | Status: AC
  Administered 2023-07-30: 650 mg via ORAL
  Filled 2023-07-30: qty 2

## 2023-07-30 MED ORDER — INSULIN ASPART 100 UNIT/ML IJ SOLN
0.0000 [IU] | Freq: Three times a day (TID) | INTRAMUSCULAR | Status: DC
  Administered 2023-07-31 (×2): 3 [IU] via SUBCUTANEOUS
  Administered 2023-08-01 (×3): 2 [IU] via SUBCUTANEOUS
  Administered 2023-08-02: 5 [IU] via SUBCUTANEOUS
  Administered 2023-08-03: 2 [IU] via SUBCUTANEOUS

## 2023-07-30 MED ORDER — AMLODIPINE BESYLATE 10 MG PO TABS
10.0000 mg | ORAL_TABLET | Freq: Every day | ORAL | Status: DC
  Administered 2023-07-30 – 2023-08-03 (×5): 10 mg via ORAL
  Filled 2023-07-30: qty 2
  Filled 2023-07-30 (×4): qty 1

## 2023-07-30 MED ORDER — ALBUTEROL SULFATE HFA 108 (90 BASE) MCG/ACT IN AERS: 2.0000 | INHALATION_SPRAY | RESPIRATORY_TRACT | Status: DC | PRN

## 2023-07-30 MED ORDER — SODIUM CHLORIDE 0.9 % IV SOLN
500.0000 mg | Freq: Once | INTRAVENOUS | Status: AC
  Administered 2023-07-30: 500 mg via INTRAVENOUS
  Filled 2023-07-30: qty 5

## 2023-07-30 MED ORDER — ONDANSETRON HCL 4 MG PO TABS: 4.0000 mg | ORAL_TABLET | Freq: Four times a day (QID) | ORAL | Status: DC | PRN

## 2023-07-30 MED ORDER — ALBUTEROL SULFATE (2.5 MG/3ML) 0.083% IN NEBU
2.5000 mg | INHALATION_SOLUTION | RESPIRATORY_TRACT | Status: DC | PRN
  Administered 2023-08-01: 2.5 mg via RESPIRATORY_TRACT
  Filled 2023-07-30: qty 3

## 2023-07-30 MED ORDER — ENOXAPARIN SODIUM 40 MG/0.4ML IJ SOSY
40.0000 mg | PREFILLED_SYRINGE | INTRAMUSCULAR | Status: DC
  Administered 2023-07-30: 40 mg via SUBCUTANEOUS
  Filled 2023-07-30: qty 0.4

## 2023-07-30 MED ORDER — ASPIRIN 81 MG PO TBEC
81.0000 mg | DELAYED_RELEASE_TABLET | Freq: Every day | ORAL | Status: DC
  Administered 2023-07-31 – 2023-08-03 (×4): 81 mg via ORAL
  Filled 2023-07-30 (×4): qty 1

## 2023-07-30 MED ORDER — SODIUM CHLORIDE 0.9 % IV SOLN
2.0000 g | INTRAVENOUS | Status: DC
  Administered 2023-07-31 – 2023-08-02 (×3): 2 g via INTRAVENOUS
  Filled 2023-07-30 (×3): qty 20

## 2023-07-30 MED ORDER — SODIUM CHLORIDE 0.9 % IV BOLUS
1000.0000 mL | Freq: Once | INTRAVENOUS | Status: AC
  Administered 2023-07-30: 1000 mL via INTRAVENOUS

## 2023-07-30 MED ORDER — POLYSACCHARIDE IRON COMPLEX 150 MG PO CAPS
150.0000 mg | ORAL_CAPSULE | Freq: Every day | ORAL | Status: DC
  Administered 2023-07-30 – 2023-08-03 (×5): 150 mg via ORAL
  Filled 2023-07-30 (×6): qty 1

## 2023-07-30 MED ORDER — CARVEDILOL 6.25 MG PO TABS
6.2500 mg | ORAL_TABLET | Freq: Two times a day (BID) | ORAL | Status: DC
  Administered 2023-07-30 – 2023-08-01 (×4): 6.25 mg via ORAL
  Filled 2023-07-30 (×4): qty 1

## 2023-07-30 NOTE — Assessment & Plan Note (Signed)
Patient presented with tachypnea to the 30s, hypoxia to 85%, requiring 3 L supplemental oxygen in the setting of multifocal pneumonia.

## 2023-07-30 NOTE — ED Notes (Signed)
Patient transported to X-ray 

## 2023-07-30 NOTE — ED Notes (Signed)
ED TO INPATIENT HANDOFF REPORT  ED Nurse Name and Phone #:  Corliss Blacker, RN [phone]  S Name/Age/Gender Jennifer Molina 67 y.o. female Room/Bed: 56C/042C  Code Status   Code Status: Prior  Home/SNF/Other Home Patient oriented to: self, place, time, and situation Is this baseline? Yes   Triage Complete: Triage complete  Chief Complaint Sepsis [facility]) [A41.9]  Triage Note PT came in for fever, cough, x 1wk. Increased SOB this morning.  Also complains of 5/10 Abd pain especially with cough.    Allergies No Known Allergies  Level of Care/Admitting Diagnosis ED Disposition     ED Disposition  Admit   Condition  --   Comment  Hospital Area: [facility] [[id number]]  Level of Care: Med-Surg [16]  May admit patient to [facility] or [facility] if equivalent level of care is available:: Yes  Covid Evaluation: Confirmed COVID Negative  Diagnosis: Sepsis [facility]) [[id number]]  Admitting Physician: Alberteen Sam [[id number]]  Attending Physician: Alberteen Sam [[id number]]  Certification:: I certify this patient will need inpatient services for at least 2 midnights  Expected Medical Readiness: 08/02/2023          B Medical/Surgery History Past Medical History:  Diagnosis Date   Dementia (HCC) 03/28/2019   DM (diabetes mellitus), type 2, uncontrolled 2015   Has poor vision.  Family not aware of A1C or what sugars run.  Believe not well controlled.  Not able to monitor due to cost of monitoring equipment   Hypertension 2015   History reviewed. No pertinent surgical history.   A IV Location/Drains/Wounds Patient Lines/Drains/Airways Status     Active Line/Drains/Airways     Name Placement date Placement time Site Days   Peripheral IV 07/30/23 20 G Anterior;Proximal;Right Forearm 07/30/23  1147  Forearm  less than 1            Intake/Output Last 24 hours  Intake/Output Summary (Last 24 hours) at 07/30/2023 1651 Last data filed at 07/30/2023  1357 Gross per 24 hour  Intake 1350 ml  Output --  Net 1350 ml    Labs/Imaging Results for orders placed or performed during the hospital encounter of 07/30/23 (from the past 48 hours)  Lipase, blood     Status: None   Collection Time: 07/30/23 10:20 AM  Result Value Ref Range   Lipase 31 11 - 51 U/L    Comment: Performed at [facility], [address] [place], [place] [address]  Comprehensive metabolic panel     Status: Abnormal   Collection Time: 07/30/23 10:20 AM  Result Value Ref Range   Sodium 131 (L) 135 - 145 mmol/L   Potassium 3.7 3.5 - 5.1 mmol/L   Chloride 100 98 - 111 mmol/L   CO2 22 22 - 32 mmol/L   Glucose, Bld 149 (H) 70 - 99 mg/dL    Comment: Glucose reference range applies only to samples taken after fasting for at least 8 hours.   BUN 20 8 - 23 mg/dL   Creatinine, Ser 4.69 (H) 0.44 - 1.00 mg/dL   Calcium 8.1 (L) 8.9 - 10.3 mg/dL   Total Protein 6.4 (L) 6.5 - 8.1 g/dL   Albumin 1.9 (L) 3.5 - 5.0 g/dL   AST 21 15 - 41 U/L   ALT 15 0 - 44 U/L   Alkaline Phosphatase 51 38 - 126 U/L   Total Bilirubin 0.6 <1.2 mg/dL   GFR, Estimated 28 (L) >60 mL/min    Comment: (NOTE) Calculated  using the CKD-EPI Creatinine Equation (2021)    Anion gap 9 5 - 15    Comment: Performed at [facility], [address], [place], [place] [address]  CBC     Status: Abnormal   Collection Time: 07/30/23 10:20 AM  Result Value Ref Range   WBC 12.7 (H) 4.0 - 10.5 K/uL   RBC 3.30 (L) 3.87 - 5.11 MIL/uL   Hemoglobin 8.2 (L) 12.0 - 15.0 g/dL    Comment: Reticulocyte Hemoglobin testing may be clinically indicated, consider ordering this additional test [id number]    HCT 24.9 (L) 36.0 - 46.0 %   MCV 75.5 (L) 80.0 - 100.0 fL   MCH 24.8 (L) 26.0 - 34.0 pg   MCHC 32.9 30.0 - 36.0 g/dL   RDW 24.4 01.0 - 27.2 %   Platelets 368 150 - 400 K/uL   nRBC 0.0 0.0 - 0.2 %    Comment: Performed at [facility], [address], [place], [place] [address]  Resp panel by RT-PCR  (RSV, Flu A&B, Covid)     Status: None   Collection Time: 07/30/23 11:12 AM   Specimen: Nasal Swab  Result Value Ref Range   SARS Coronavirus 2 by RT PCR NEGATIVE NEGATIVE   Influenza A by PCR NEGATIVE NEGATIVE   Influenza B by PCR NEGATIVE NEGATIVE    Comment: (NOTE) The Xpert Xpress SARS-CoV-2/FLU/RSV plus assay is intended as an aid in the diagnosis of influenza from Nasopharyngeal swab specimens and should not be used as a sole basis for treatment. Nasal washings and aspirates are unacceptable for Xpert Xpress SARS-CoV-2/FLU/RSV testing.  Fact Sheet for Patients: BloggerCourse.com  Fact Sheet for Healthcare Providers: SeriousBroker.it  This test is not yet approved or cleared by the [place] FDA and has been authorized for detection and/or diagnosis of SARS-CoV-2 by FDA under an Emergency Use Authorization (EUA). This EUA will remain in effect (meaning this test can be used) for the duration of the COVID-19 declaration under Section 564(b)(1) of the Act, 21 U.S.C. section 360bbb-3(b)(1), unless the authorization is terminated or revoked.     Resp Syncytial Virus by PCR NEGATIVE NEGATIVE    Comment: (NOTE) Fact Sheet for Patients: BloggerCourse.com  Fact Sheet for Healthcare Providers: SeriousBroker.it  This test is not yet approved or cleared by the [place] FDA and has been authorized for detection and/or diagnosis of SARS-CoV-2 by FDA under an Emergency Use Authorization (EUA). This EUA will remain in effect (meaning this test can be used) for the duration of the COVID-19 declaration under Section 564(b)(1) of the Act, 21 U.S.C. section 360bbb-3(b)(1), unless the authorization is terminated or revoked.  Performed at [facility], [address], [place], [place] [address]   I-Stat Lactic Acid     Status: None   Collection Time: 07/30/23 11:43 AM   Result Value Ref Range   Lactic Acid, Venous 0.7 0.5 - 1.9 mmol/L  Troponin I (High Sensitivity)     Status: Abnormal   Collection Time: 07/30/23 12:17 PM  Result Value Ref Range   Troponin I (High Sensitivity) 87 (H) <18 ng/L    Comment: (NOTE) Elevated high sensitivity troponin I (hsTnI) values and significant  changes across serial measurements may suggest ACS but many other  chronic and acute conditions are known to elevate hsTnI results.  Refer to the "Links" section for chest pain algorithms and additional  guidance. Performed at [facility], [address], [place], [place] [address]  Troponin I (High Sensitivity)     Status: Abnormal   Collection Time: 07/30/23  1:11 PM  Result Value Ref Range   Troponin I (High Sensitivity) 90 (H) <18 ng/L    Comment: (NOTE) Elevated high sensitivity troponin I (hsTnI) values and significant  changes across serial measurements may suggest ACS but many other  chronic and acute conditions are known to elevate hsTnI results.  Refer to the "Links" section for chest pain algorithms and additional  guidance. Performed at [facility], [address], [place], [place] [address]    CT CHEST ABDOMEN PELVIS WO CONTRAST Result Date: 07/30/2023 CLINICAL DATA:  Sepsis.  History of multiple myeloma. EXAM: CT CHEST, ABDOMEN AND PELVIS WITHOUT CONTRAST TECHNIQUE: Multidetector CT imaging of the chest, abdomen and pelvis was performed following the standard protocol without IV contrast. RADIATION DOSE REDUCTION: This exam was performed according to the departmental dose-optimization program which includes automated exposure control, adjustment of the mA and/or kV according to patient size and/or use of iterative reconstruction technique. COMPARISON:  07/27/2019 and PET-CT 06/20/2022 FINDINGS: CT CHEST FINDINGS Cardiovascular: Limited evaluation of aortic arch due to motion artifact. Heart size is within normal limits. Small amount of  pericardial fluid. Coronary artery calcifications. Mediastinum/Nodes: No significant mediastinal lymph node enlargement. Limited evaluation for hilar lymph nodes due to lack of intravascular contrast. No axillary lymph node enlargement. Lungs/Pleura: Trace left pleural fluid. Patchy airspace disease in both lungs. Airspace disease is most prominent in the bilateral lower lobes and the right upper lobe. Findings are suggestive for multifocal pneumonia. Focal consolidation or volume loss in the posterior left lung base. Musculoskeletal: No acute bone abnormality. CT ABDOMEN PELVIS FINDINGS Hepatobiliary: Mild gallbladder distension. Study has limitations due to motion artifact. Pancreas: Unremarkable. No pancreatic ductal dilatation or surrounding inflammatory changes. Spleen: Normal in size without focal abnormality. Adrenals/Urinary Tract: Normal adrenal glands. Normal appearance of both kidneys without stones or hydronephrosis. Normal appearance of the urinary bladder. Stomach/Bowel: Normal stomach. No bowel dilatation or obstruction. Normal appearance of the appendix without acute inflammatory changes. There is oral contrast within the colon. Vascular/Lymphatic: Aortic atherosclerosis. No enlarged abdominal or pelvic lymph nodes. Reproductive: Fluid surrounding the uterus. No evidence for an adnexal mass. Other: Small amount of low-density fluid in the pelvis. Trace fluid in the right paracolic gutter and right lower quadrant of the abdomen. No evidence for free air. Musculoskeletal: Mild subcutaneous edema. No acute bone abnormality. IMPRESSION: 1. Patchy airspace disease in both lungs. Findings are suggestive for multifocal pneumonia. 2. Trace left pleural fluid. 3. Small amount of low-density fluid in the abdomen and pelvis. Findings are nonspecific but could be related to an acute infectious or inflammatory process. 4. Mild gallbladder distension. 5. Aortic Atherosclerosis (ICD10-I70.0). Electronically Signed    By: Richarda Overlie M.D.   On: 07/30/2023 14:52   DG Chest 2 View Result Date: 07/30/2023 CLINICAL DATA:  Shortness of breath.  Fever.  Cough. EXAM: CHEST - 2 VIEW COMPARISON:  Radiograph from 07/23/2019 and CT scan chest from 07/27/2019. FINDINGS: Redemonstration of reticulonodular opacities throughout bilateral lungs with relative sparing of the periphery, which appears grossly unchanged since multiple prior studies dating back to December 2020 and favored to represent post infective/inflammatory scarring. There is also left retrocardiac opacity which is grossly similar to the prior study; however, there is new trace left pleural effusion. No right pleural effusion. No pneumothorax. Stable cardio-mediastinal silhouette. No acute osseous abnormalities. The soft tissues are within normal limits. IMPRESSION: *New left pleural  effusion since the prior study. *Otherwise diffuse reticulonodular opacities throughout bilateral lungs with relative sparing of the periphery, which appear grossly similar to the prior CT scan chest from December 2020. However, superimposed new opacity particularly in the left retrocardiac region is difficult to exclude on the basis of this exam. Correlate clinically to determine the need for additional imaging with chest CT scan. Electronically Signed   By: Jules Schick M.D.   On: 07/30/2023 11:02    Pending Labs Unresulted Labs (From admission, onward)     Start     Ordered   07/30/23 1618  Respiratory (~20 pathogens) panel by PCR  (Respiratory panel by PCR (~20 pathogens, ~24 hr TAT)  w precautions)  Once,   R        07/30/23 1617   07/30/23 1111  Urinalysis, w/ Reflex to Culture (Infection Suspected) -Urine, Clean Catch  Once,   URGENT       Question:  Specimen Source  Answer:  Urine, Clean Catch   07/30/23 1111   07/30/23 1111  Culture, blood (routine x 2)  BLOOD CULTURE X 2,   R (with STAT occurrences)      07/30/23 1111            Vitals/Pain Today's Vitals    07/30/23 1400 07/30/23 1527 07/30/23 1615 07/30/23 1630  BP: (!) 166/78 (!) 171/58 (!) 179/87 (!) 180/75  Pulse: 87 86 89 88  Resp: (!) 31 (!) 33 (!) 27 (!) 31  Temp:  98.6 F (37 C)    TempSrc:  Oral    SpO2: 97% 98% 100% 100%  PainSc:        Isolation Precautions Droplet precaution  Medications Medications  albuterol (VENTOLIN HFA) 108 (90 Base) MCG/ACT inhaler 2 puff (has no administration in time range)  acetaminophen (TYLENOL) tablet 650 mg (650 mg Oral Given 07/30/23 1150)  sodium chloride 0.9 % bolus 1,000 mL (0 mLs Intravenous Stopped 07/30/23 1356)  cefTRIAXone (ROCEPHIN) 1 g in sodium chloride 0.9 % 100 mL IVPB (0 g Intravenous Stopped 07/30/23 1216)  azithromycin (ZITHROMAX) 500 mg in sodium chloride 0.9 % 250 mL IVPB (0 mg Intravenous Stopped 07/30/23 1357)  aspirin chewable tablet 324 mg (324 mg Oral Given 07/30/23 1549)    Mobility walks with person assist     Focused Assessments Pulmonary Assessment Handoff:  Lung sounds:   O2 Device: Nasal Cannula O2 Flow Rate (L/min): 3 L/min    R Recommendations: See Admitting Provider Note  Report given to:   Additional Notes: Interpreter will be needed for patient.

## 2023-07-30 NOTE — Assessment & Plan Note (Signed)
Follows with Dr. Radene Knee

## 2023-07-30 NOTE — Assessment & Plan Note (Signed)
Patient presented with tachycardia, tachypnea, leukocytosis, respiratory failure, elevated troponin, and AKI. Sofa 2 over baseline (hypoxia, Creatinine).  Source pneumonia. - Check RVP - Flutter and IS - Continue Rocephin and azithromycin - Sputum - Follow blood cultures - Legionella urine

## 2023-07-30 NOTE — Hospital Course (Addendum)
Jennifer Molina is a 67 y.o. F with MGUS, anemia, CKD IIIb, uncontrolled DM and HTN who presented with several days fever, cough, dyspnea, sputum.  Everyone in the family recently got URI symptoms, patient did as well, and initially got better but then started to deterioate, appeared short of breath, weak, and tachypneic and so brought to the ER.  In the ER, hypoxic to 85%, tachypneic.  CT chest showed multifocal pneumonia.  COVID, RSV, Flu negative.  Started on antibiotics and admitted for pneumonia sepsis.

## 2023-07-30 NOTE — Assessment & Plan Note (Signed)
-  Continue Lipitor °

## 2023-07-30 NOTE — Assessment & Plan Note (Addendum)
Likely hypovolemic - IV fluids and trend - Continue chlorthalidone

## 2023-07-30 NOTE — H&P (Signed)
History and Physical    PatientMarland Molina Itzelle Herdegen KGM:[id number] DOB: 1956-04-01 DOA: 07/30/2023 DOS: the patient was seen and examined on 07/30/2023 PCP: Rema Fendt, NP  Patient coming from: Home  Chief Complaint:  Chief Complaint  Patient presents with   Shortness of Breath       HPI:  Jennifer Molina is a 67 y.o. F with MGUS, anemia, CKD IIIb, uncontrolled DM and HTN who presented with several days fever, cough, dyspnea, sputum.  Everyone in the family recently got URI symptoms, patient did as well, and initially got better but then started to deterioate, appeared short of breath, weak, and tachypneic and so brought to the ER.  In the ER, hypoxic to 85%, tachypneic.  CT chest showed multifocal pneumonia.  COVID, RSV, Flu negative.  Started on antibiotics and admitted for pneumonia sepsis.   In person Rh[place] interpreter was sought but not available.  Interpretation done by daughter at bedside.   Review of Systems  Constitutional:  Positive for fever and malaise/fatigue.  HENT:  Positive for sore throat.   Respiratory:  Positive for cough, sputum production and shortness of breath. Negative for hemoptysis and wheezing.   Cardiovascular:  Negative for chest pain, orthopnea, leg swelling and PND.  Gastrointestinal:  Positive for abdominal pain. Negative for blood in stool, constipation, diarrhea, melena, nausea and vomiting.  Genitourinary:  Negative for dysuria, frequency and urgency.  Neurological:  Positive for weakness. Negative for loss of consciousness.  All other systems reviewed and are negative.    Past Medical History:  Diagnosis Date   Chronic kidney disease IIIb    Dementia (HCC) 03/28/2019   DM (diabetes mellitus), type 2, uncontrolled 2015   Has poor vision.  Family not aware of A1C or what sugars run.  Believe not well controlled.  Not able to monitor due to cost of monitoring equipment   Hypertension 2015   History reviewed. No pertinent surgical  history.    Social History: From [place].  Lives with daughters.  Ambulates independently.   reports that she has never smoked. She has never used smokeless tobacco. She reports that she does not drink alcohol and does not use drugs.  No Known Allergies  Family History  Problem Relation Age of Onset   Diabetes Mother    Diabetes Mellitus II Neg Hx     Prior to Admission medications   Medication Sig Start Date End Date Taking? Authorizing Provider  acetaminophen (TYLENOL) 500 MG tablet Take 1,000 mg by mouth every 6 (six) hours as needed for mild pain or fever.   Yes [provider]  amLODipine-olmesartan (AZOR) 10-40 MG tablet Take 1 tablet by mouth daily. 02/06/22  Yes [provider]  ASPIRIN LOW DOSE 81 MG tablet TAKE 1 TABLET(81 MG) BY MOUTH DAILY Patient taking differently: Take 81 mg by mouth daily. 03/09/23  Yes Johney Maine, MD  atorvastatin (LIPITOR) 40 MG tablet Take 1 tablet by mouth once daily 09/16/22  Yes Zonia Kief, Amy J, NP  carvedilol (COREG) 6.25 MG tablet Take 1 tablet (6.25 mg total) by mouth 2 (two) times daily. 10/01/22  Yes BranchAlben Spittle, MD  chlorthalidone (HYGROTON) 50 MG tablet Take 1 tablet (50 mg total) by mouth daily. 11/14/21  Yes BranchAlben Spittle, MD  dapagliflozin propanediol (FARXIGA) 10 MG TABS tablet TAKE 1 TABLET(10 MG) BY MOUTH DAILY BEFORE BREAKFAST 10/14/22  Yes Reather Littler, MD  gabapentin (NEURONTIN) 300 MG capsule TAKE 1 CAPSULE(300 MG) BY MOUTH AT BEDTIME Patient taking  differently: Take 300 mg by mouth at bedtime. 04/07/23  Yes Johney Maine, MD  glimepiride (AMARYL) 2 MG tablet TAKE 1 TABLET(2 MG) BY MOUTH DAILY WITH SUPPER Patient taking differently: Take 2 mg by mouth daily with supper. 02/06/23  Yes Motwani, Komal, MD  iron polysaccharides (NIFEREX) 150 MG capsule Take 1 capsule (150 mg total) by mouth daily. 04/13/23  Yes Johney Maine, MD  metFORMIN (GLUCOPHAGE-XR) 500 MG 24 hr tablet Take 2 tablets (1,000 mg  total) by mouth daily. 12/16/22  Yes Reather Littler, MD  Blood Glucose Monitoring Suppl (ACCU-CHEK GUIDE ME) w/Device KIT Use as instructed to check blood sugars 09/25/22   Reather Littler, MD  Blood Glucose Monitoring Suppl (TRUE METRIX METER) w/Device KIT Use as directed 07/23/21   Rema Fendt, NP  glucose blood (ACCU-CHEK GUIDE) test strip 1 each by Other route daily. And lancets 1/day 09/25/22   Reather Littler, MD  glucose blood (TRUE METRIX BLOOD GLUCOSE TEST) test strip Use as instructed 07/23/21   Rema Fendt, NP  TRUEplus Lancets 28G MISC Use as directed 07/23/21   Rema Fendt, NP    Physical Exam: Vitals:   07/30/23 1400 07/30/23 1527 07/30/23 1615 07/30/23 1630  BP: (!) 166/78 (!) 171/58 (!) 179/87 (!) 180/75  Pulse: 87 86 89 88  Resp: (!) 31 (!) 33 (!) 27 (!) 31  Temp:  98.6 F (37 C)    TempSrc:  Oral    SpO2: 97% 98% 100% 100%   Elderly adult female, lying in bed, appears weak and tired Tachycardic, regular, no JVD, no peripheral edema Respiratory rate increased, lung sounds diminished with coarse, no wheezing Abdomen soft without tenderness palpation or guarding, no ascites or distention Oropharynx is moist, no oral lesions, edentulous, lips normal Attention normal, affect required, psychomotor slowing noted, generalized weakness noted, face symmetric, oriented to self, hospital, daughters    Data Reviewed: Basic metabolic panel shows mild hyponatremia, creatinine 1.494 from baseline 1.3 Albumin low CBC shows leukocytosis, chronic anemia  Lipase normal COVID, flu, respiratory syncytial virus negative Lactate 0.7 Troponin 87-90, low and flat Chest x-ray, personally reviewed, shows diffuse reticular opacities bilaterally CT chest abdomen and pelvis shows bilateral multifocal patchy pneumonia CT of the abdomen report reviewed shows only some free fluid, nonspecific, not associated with significant findings on abdominal exam ECG, personally reviewed, shows nonspecific TW  findings which are unchanged on repeat, normal sinus rhythm, no ST changes    Assessment and Plan: * Sepsis (HCC) due to multifocal pneumonia Patient presented with tachycardia, tachypnea, leukocytosis, respiratory failure, elevated troponin, and AKI. Sofa 2 over baseline (hypoxia, Creatinine).  Source pneumonia.  PSI 149, risk class V - Check RVP - Flutter and IS - Continue Rocephin and azithromycin - Sputum - Follow blood cultures - Legionella urine    Myocardial injury No chest pain, ECG changes.  Due to sepsis.  No further work up needed.  Anemia in chronic kidney disease (CKD) Hgb stable relative to baseline 8-9 g/dL - Continue Niferex  MGUS (monoclonal gammopathy of unknown significance) Follows with Dr. Radene Knee  Hyponatremia Likely hypovolemic - IV fluids and trend - Continue chlorthalidone  Acute renal failure superimposed on stage 3b chronic kidney disease (HCC) Baseline creatinine 1.4-1.6.  Creatinine here 1.94, due to sepsis/pneumonia - IV fluids - Hold Olmesartan, Farxiga  Hyperlipidemia - Continue Lipitor  Acute respiratory failure with hypoxia (HCC) Patient presented with tachypnea to the 30s, hypoxia to 85%, requiring 3 L supplemental oxygen in the setting  of multifocal pneumonia.  Dementia [facility]) Patient has had no formal evaluation, but PCP notes from as far back as 2020 suggest that she has had longstanding forgetfulness. - Standard delirium precautions  Hypertension BP elevated - Resume home chlorthalidone, Coreg, amlodipine -Hold olmesartan for now  Uncontrolled type 2 diabetes mellitus with hyperglycemia, without long-term current use of insulin (HCC) - Check A1c - Hold metformin, farxiga, glimepiride - SS corrections         Advance Care Planning: FULL code confirmed with daughters  Consults: None  Family Communication: Daughters at the bedside  Severity of Illness: The appropriate patient status for this patient is INPATIENT.  Inpatient status is judged to be reasonable and necessary in order to provide the required intensity of service to ensure the patient's safety. The patient's presenting symptoms, physical exam findings, and initial radiographic and laboratory data in the context of their chronic comorbidities is felt to place them at high risk for further clinical deterioration. Furthermore, it is not anticipated that the patient will be medically stable for discharge from the hospital within 2 midnights of admission.   * I certify that at the point of admission it is my clinical judgment that the patient will require inpatient hospital care spanning beyond 2 midnights from the point of admission due to high intensity of service, high risk for further deterioration and high frequency of surveillance required.*  Author: Alberteen Sam, MD 07/30/2023 5:02 PM  For on call review [email or web address].

## 2023-07-30 NOTE — ED Provider Notes (Signed)
[place] EMERGENCY DEPARTMENT AT [facility] Provider Note   CSN: [id number] Arrival date & time: 07/30/23  7829     History  Chief Complaint  Patient presents with   Shortness of Breath    Jennifer Molina is a 67 y.o. female.  HPI 67 year old female presents with shortness of breath.  History is from family at the bedside who helps translate.  Patient's been sick for about a week.  Everyone in the family was ill but she has not recovered like everyone else.  Has been having fevers of unknown number in addition to cough with yellow sputum, chest pain when coughing and abdominal pain.  The abdominal pain is primarily when she coughs but is still present even at rest.  She has had some vomiting but not for a few days.  She was more short of breath today prompting an emergency department evaluation.  No diarrhea.  She does not normally wear oxygen but was hypoxic on arrival to triage.  She had some leg swelling recently but that seemed to resolve.  Her face seemed puffy to family.  Home Medications Prior to Admission medications   Medication Sig Start Date End Date Taking? Authorizing Provider  acetaminophen (TYLENOL) 500 MG tablet Take 1,000 mg by mouth every 6 (six) hours as needed for mild pain or fever.    [provider]  amLODipine-olmesartan (AZOR) 10-40 MG tablet Take 1 tablet by mouth daily. 02/06/22   [provider]  ASPIRIN LOW DOSE 81 MG tablet TAKE 1 TABLET(81 MG) BY MOUTH DAILY 03/09/23   Johney Maine, MD  atorvastatin (LIPITOR) 40 MG tablet Take 1 tablet by mouth once daily 09/16/22   Rema Fendt, NP  Blood Glucose Monitoring Suppl (ACCU-CHEK GUIDE ME) w/Device KIT Use as instructed to check blood sugars 09/25/22   Reather Littler, MD  Blood Glucose Monitoring Suppl (TRUE METRIX METER) w/Device KIT Use as directed 07/23/21   Rema Fendt, NP  carvedilol (COREG) 6.25 MG tablet Take 1 tablet (6.25 mg total) by mouth 2 (two) times daily. 10/01/22    Maisie Fus, MD  chlorthalidone (HYGROTON) 50 MG tablet Take 1 tablet (50 mg total) by mouth daily. 11/14/21   Maisie Fus, MD  dapagliflozin propanediol (FARXIGA) 10 MG TABS tablet TAKE 1 TABLET(10 MG) BY MOUTH DAILY BEFORE BREAKFAST 10/14/22   Reather Littler, MD  gabapentin (NEURONTIN) 300 MG capsule TAKE 1 CAPSULE(300 MG) BY MOUTH AT BEDTIME 04/07/23   Johney Maine, MD  glimepiride (AMARYL) 2 MG tablet TAKE 1 TABLET(2 MG) BY MOUTH DAILY WITH SUPPER 02/06/23   Motwani, Komal, MD  glucose blood (ACCU-CHEK GUIDE) test strip 1 each by Other route daily. And lancets 1/day 09/25/22   Reather Littler, MD  glucose blood (TRUE METRIX BLOOD GLUCOSE TEST) test strip Use as instructed 07/23/21   Rema Fendt, NP  iron polysaccharides (NIFEREX) 150 MG capsule Take 1 capsule (150 mg total) by mouth daily. 04/13/23   Johney Maine, MD  metFORMIN (GLUCOPHAGE-XR) 500 MG 24 hr tablet Take 2 tablets (1,000 mg total) by mouth daily. 12/16/22   Reather Littler, MD  TRUEplus Lancets 28G MISC Use as directed 07/23/21   Rema Fendt, NP      Allergies    Patient has no known allergies.    Review of Systems   Review of Systems  Constitutional:  Positive for fever.  Respiratory:  Positive for cough and shortness of breath.   Cardiovascular:  Positive  for chest pain.  Gastrointestinal:  Positive for abdominal pain and vomiting.    Physical Exam Updated Vital Signs BP (!) 166/78   Pulse 87   Temp 99.3 F (37.4 C)   Resp (!) 31   LMP  (LMP Unknown)   SpO2 97%  Physical Exam Vitals and nursing note reviewed.  Constitutional:      Appearance: She is well-developed. She is not diaphoretic.  HENT:     Head: Normocephalic and atraumatic.  Cardiovascular:     Rate and Rhythm: Normal rate and regular rhythm.     Heart sounds: Normal heart sounds.  Pulmonary:     Effort: Pulmonary effort is normal. Tachypnea present. No accessory muscle usage or respiratory distress.     Breath sounds: Examination of  the right-lower field reveals rales. Examination of the left-lower field reveals rales. Rales present.  Abdominal:     Palpations: Abdomen is soft.     Tenderness: There is abdominal tenderness in the left upper quadrant.  Musculoskeletal:     Right lower leg: No edema.     Left lower leg: No edema.  Skin:    General: Skin is warm and dry.  Neurological:     Mental Status: She is alert.     ED Results / Procedures / Treatments   Labs (all labs ordered are listed, but only abnormal results are displayed) Labs Reviewed  COMPREHENSIVE METABOLIC PANEL - Abnormal; Notable for the following components:      Result Value   Sodium 131 (*)    Glucose, Bld 149 (*)    Creatinine, Ser 1.94 (*)    Calcium 8.1 (*)    Total Protein 6.4 (*)    Albumin 1.9 (*)    GFR, Estimated 28 (*)    All other components within normal limits  CBC - Abnormal; Notable for the following components:   WBC 12.7 (*)    RBC 3.30 (*)    Hemoglobin 8.2 (*)    HCT 24.9 (*)    MCV 75.5 (*)    MCH 24.8 (*)    All other components within normal limits  TROPONIN I (HIGH SENSITIVITY) - Abnormal; Notable for the following components:   Troponin I (High Sensitivity) 87 (*)    All other components within normal limits  TROPONIN I (HIGH SENSITIVITY) - Abnormal; Notable for the following components:   Troponin I (High Sensitivity) 90 (*)    All other components within normal limits  RESP PANEL BY RT-PCR (RSV, FLU A&B, COVID)  RVPGX2  CULTURE, BLOOD (ROUTINE X 2)  CULTURE, BLOOD (ROUTINE X 2)  LIPASE, BLOOD  URINALYSIS, W/ REFLEX TO CULTURE (INFECTION SUSPECTED)  I-STAT CG4 LACTIC ACID, ED    EKG EKG Interpretation Date/Time:  Thursday July 30 2023 13:58:38 EST Ventricular Rate:  88 PR Interval:  153 QRS Duration:  75 QT Interval:  394 QTC Calculation: 477 R Axis:   78  Text Interpretation: Sinus rhythm Anteroseptal infarct, old nonspecific ST changes similar to earlier in the day Confirmed by Pricilla Loveless [phone]) on 07/30/2023 2:38:27 PM  Radiology CT CHEST ABDOMEN PELVIS WO CONTRAST Result Date: 07/30/2023 CLINICAL DATA:  Sepsis.  History of multiple myeloma. EXAM: CT CHEST, ABDOMEN AND PELVIS WITHOUT CONTRAST TECHNIQUE: Multidetector CT imaging of the chest, abdomen and pelvis was performed following the standard protocol without IV contrast. RADIATION DOSE REDUCTION: This exam was performed according to the departmental dose-optimization program which includes automated exposure control, adjustment of the mA and/or kV according  to patient size and/or use of iterative reconstruction technique. COMPARISON:  07/27/2019 and PET-CT 06/20/2022 FINDINGS: CT CHEST FINDINGS Cardiovascular: Limited evaluation of aortic arch due to motion artifact. Heart size is within normal limits. Small amount of pericardial fluid. Coronary artery calcifications. Mediastinum/Nodes: No significant mediastinal lymph node enlargement. Limited evaluation for hilar lymph nodes due to lack of intravascular contrast. No axillary lymph node enlargement. Lungs/Pleura: Trace left pleural fluid. Patchy airspace disease in both lungs. Airspace disease is most prominent in the bilateral lower lobes and the right upper lobe. Findings are suggestive for multifocal pneumonia. Focal consolidation or volume loss in the posterior left lung base. Musculoskeletal: No acute bone abnormality. CT ABDOMEN PELVIS FINDINGS Hepatobiliary: Mild gallbladder distension. Study has limitations due to motion artifact. Pancreas: Unremarkable. No pancreatic ductal dilatation or surrounding inflammatory changes. Spleen: Normal in size without focal abnormality. Adrenals/Urinary Tract: Normal adrenal glands. Normal appearance of both kidneys without stones or hydronephrosis. Normal appearance of the urinary bladder. Stomach/Bowel: Normal stomach. No bowel dilatation or obstruction. Normal appearance of the appendix without acute inflammatory changes. There is oral  contrast within the colon. Vascular/Lymphatic: Aortic atherosclerosis. No enlarged abdominal or pelvic lymph nodes. Reproductive: Fluid surrounding the uterus. No evidence for an adnexal mass. Other: Small amount of low-density fluid in the pelvis. Trace fluid in the right paracolic gutter and right lower quadrant of the abdomen. No evidence for free air. Musculoskeletal: Mild subcutaneous edema. No acute bone abnormality. IMPRESSION: 1. Patchy airspace disease in both lungs. Findings are suggestive for multifocal pneumonia. 2. Trace left pleural fluid. 3. Small amount of low-density fluid in the abdomen and pelvis. Findings are nonspecific but could be related to an acute infectious or inflammatory process. 4. Mild gallbladder distension. 5. Aortic Atherosclerosis (ICD10-I70.0). Electronically Signed   By: Richarda Overlie M.D.   On: 07/30/2023 14:52   DG Chest 2 View Result Date: 07/30/2023 CLINICAL DATA:  Shortness of breath.  Fever.  Cough. EXAM: CHEST - 2 VIEW COMPARISON:  Radiograph from 07/23/2019 and CT scan chest from 07/27/2019. FINDINGS: Redemonstration of reticulonodular opacities throughout bilateral lungs with relative sparing of the periphery, which appears grossly unchanged since multiple prior studies dating back to December 2020 and favored to represent post infective/inflammatory scarring. There is also left retrocardiac opacity which is grossly similar to the prior study; however, there is new trace left pleural effusion. No right pleural effusion. No pneumothorax. Stable cardio-mediastinal silhouette. No acute osseous abnormalities. The soft tissues are within normal limits. IMPRESSION: *New left pleural effusion since the prior study. *Otherwise diffuse reticulonodular opacities throughout bilateral lungs with relative sparing of the periphery, which appear grossly similar to the prior CT scan chest from December 2020. However, superimposed new opacity particularly in the left retrocardiac region  is difficult to exclude on the basis of this exam. Correlate clinically to determine the need for additional imaging with chest CT scan. Electronically Signed   By: Jules Schick M.D.   On: 07/30/2023 11:02    Procedures .Critical Care  Performed by: Pricilla Loveless, MD Authorized by: Pricilla Loveless, MD   Critical care provider statement:    Critical care time (minutes):  35   Critical care time was exclusive of:  Separately billable procedures and treating other patients   Critical care was necessary to treat or prevent imminent or life-threatening deterioration of the following conditions:  Respiratory failure and renal failure   Critical care was time spent personally by me on the following activities:  Development of treatment plan  with patient or surrogate, discussions with consultants, evaluation of patient's response to treatment, examination of patient, ordering and review of laboratory studies, ordering and review of radiographic studies, ordering and performing treatments and interventions, pulse oximetry, re-evaluation of patient's condition and review of old charts     Medications Ordered in ED Medications  albuterol (VENTOLIN HFA) 108 (90 Base) MCG/ACT inhaler 2 puff (has no administration in time range)  acetaminophen (TYLENOL) tablet 650 mg (650 mg Oral Given 07/30/23 1150)  sodium chloride 0.9 % bolus 1,000 mL (0 mLs Intravenous Stopped 07/30/23 1356)  cefTRIAXone (ROCEPHIN) 1 g in sodium chloride 0.9 % 100 mL IVPB (0 g Intravenous Stopped 07/30/23 1216)  azithromycin (ZITHROMAX) 500 mg in sodium chloride 0.9 % 250 mL IVPB (0 mg Intravenous Stopped 07/30/23 1357)    ED Course/ Medical Decision Making/ A&P                                 Medical Decision Making Amount and/or Complexity of Data Reviewed Labs: ordered.    Details: Leukocytosis.  Elevated troponins but flat.  AKI. Radiology: ordered and independent interpretation performed.    Details: Pneumonia on  CT. ECG/medicine tests: ordered and independent interpretation performed.    Details: Nonspecific changes.  Risk OTC drugs. Prescription drug management. Decision regarding hospitalization.   Patient presents with respiratory failure.  Has increased work of breathing and is requiring oxygen which is new for her.  Given the questionable x-ray findings a CT was obtained, abdomen and pelvis included given her abdominal tenderness and pain.  Seems to have multifocal pneumonia which goes along with her history.  Was given Rocephin and azithromycin as well as some fluids for her AKI.  Troponins are elevated but I suspect related to the overall illness as they are flat.  However she does have some nonspecific changes and was given a dose of aspirin.  She is not in distress and does not need further O2 beyond 2 L nasal cannula and I do not think needs BiPAP.  She will need admission.  Discussed with Dr. Maryfrances Bunnell.        Final Clinical Impression(s) / ED Diagnoses Final diagnoses:  Acute respiratory failure with hypoxia (HCC)  Multifocal pneumonia  Acute kidney injury [facility])    Rx / DC Orders ED Discharge Orders     None         Pricilla Loveless, MD 07/30/23 1533

## 2023-07-30 NOTE — Assessment & Plan Note (Addendum)
Hgb stable relative to baseline 8-9 g/dL - Continue Niferex

## 2023-07-30 NOTE — Assessment & Plan Note (Signed)
BP elevated - Resume home chlorthalidone, Coreg, amlodipine -Hold olmesartan for now

## 2023-07-30 NOTE — ED Notes (Signed)
Pt sat was between 85-87 upon first vital check. Gave her 2L 02 and which brought sat up to 90. Switched to 3L, now sat is between 94-96.

## 2023-07-30 NOTE — Assessment & Plan Note (Addendum)
Baseline creatinine 1.4-1.6.  Creatinine here 1.94, due to sepsis/pneumonia - IV fluids - Hold Olmesartan, Marcelline Deist

## 2023-07-30 NOTE — ED Notes (Signed)
Pt refusing bedside commode at this time. Pt up to bathroom with assistance.

## 2023-07-30 NOTE — Assessment & Plan Note (Addendum)
-   Check A1c - Hold metformin, farxiga, glimepiride - SS corrections

## 2023-07-30 NOTE — Assessment & Plan Note (Signed)
No chest pain, ECG changes.  Due to sepsis.  No further work up needed.

## 2023-07-30 NOTE — ED Triage Notes (Signed)
PT came in for fever, cough, x 1wk. Increased SOB this morning.  Also complains of 5/10 Abd pain especially with cough.

## 2023-07-30 NOTE — Assessment & Plan Note (Signed)
Patient has had no formal evaluation, but PCP notes from as far back as 2020 suggest that she has had longstanding forgetfulness. - Standard delirium precautions

## 2023-07-31 ENCOUNTER — Inpatient Hospital Stay (HOSPITAL_COMMUNITY): Payer: Medicaid Other

## 2023-07-31 DIAGNOSIS — N179 Acute kidney failure, unspecified: Secondary | ICD-10-CM | POA: Diagnosis not present

## 2023-07-31 DIAGNOSIS — J9601 Acute respiratory failure with hypoxia: Secondary | ICD-10-CM | POA: Diagnosis not present

## 2023-07-31 DIAGNOSIS — N1832 Chronic kidney disease, stage 3b: Secondary | ICD-10-CM | POA: Diagnosis not present

## 2023-07-31 DIAGNOSIS — J189 Pneumonia, unspecified organism: Secondary | ICD-10-CM | POA: Diagnosis not present

## 2023-07-31 DIAGNOSIS — R0603 Acute respiratory distress: Secondary | ICD-10-CM | POA: Diagnosis not present

## 2023-07-31 LAB — VITAMIN B12: Vitamin B-12: 1471 pg/mL — ABNORMAL HIGH (ref 180–914)

## 2023-07-31 LAB — FERRITIN: Ferritin: 541 ng/mL — ABNORMAL HIGH (ref 11–307)

## 2023-07-31 LAB — CBC
HCT: 22.6 % — ABNORMAL LOW (ref 36.0–46.0)
Hemoglobin: 7.5 g/dL — ABNORMAL LOW (ref 12.0–15.0)
MCH: 24.7 pg — ABNORMAL LOW (ref 26.0–34.0)
MCHC: 33.2 g/dL (ref 30.0–36.0)
MCV: 74.3 fL — ABNORMAL LOW (ref 80.0–100.0)
Platelets: 341 10*3/uL (ref 150–400)
RBC: 3.04 MIL/uL — ABNORMAL LOW (ref 3.87–5.11)
RDW: 15.5 % (ref 11.5–15.5)
WBC: 8.9 10*3/uL (ref 4.0–10.5)
nRBC: 0 % (ref 0.0–0.2)

## 2023-07-31 LAB — BASIC METABOLIC PANEL
Anion gap: 5 (ref 5–15)
BUN: 20 mg/dL (ref 8–23)
CO2: 22 mmol/L (ref 22–32)
Calcium: 7.5 mg/dL — ABNORMAL LOW (ref 8.9–10.3)
Chloride: 105 mmol/L (ref 98–111)
Creatinine, Ser: 1.74 mg/dL — ABNORMAL HIGH (ref 0.44–1.00)
GFR, Estimated: 31 mL/min — ABNORMAL LOW (ref >60)
Glucose, Bld: 97 mg/dL (ref 70–99)
Potassium: 3.5 mmol/L (ref 3.5–5.1)
Sodium: 132 mmol/L — ABNORMAL LOW (ref 135–145)

## 2023-07-31 LAB — HEMOGLOBIN A1C
Hgb A1c MFr Bld: 5.9 % — ABNORMAL HIGH (ref 4.8–5.6)
Mean Plasma Glucose: 122.63 mg/dL

## 2023-07-31 LAB — ECHOCARDIOGRAM COMPLETE
AR max vel: 2.1 cm2
AV Peak grad: 9.1 mm[Hg]
Ao pk vel: 1.51 m/s
Area-P 1/2: 3.99 cm2
S' Lateral: 2.9 cm

## 2023-07-31 LAB — RETICULOCYTES
Immature Retic Fract: 23.1 % — ABNORMAL HIGH (ref 2.3–15.9)
RBC.: 3.14 MIL/uL — ABNORMAL LOW (ref 3.87–5.11)
Retic Count, Absolute: 46.8 10*3/uL (ref 19.0–186.0)
Retic Ct Pct: 1.5 % (ref 0.4–3.1)

## 2023-07-31 LAB — PROCALCITONIN: Procalcitonin: 0.82 ng/mL

## 2023-07-31 LAB — IRON AND TIBC
Iron: 17 ug/dL — ABNORMAL LOW (ref 28–170)
Saturation Ratios: 13 % (ref 10.4–31.8)
TIBC: 129 ug/dL — ABNORMAL LOW (ref 250–450)
UIBC: 112 ug/dL

## 2023-07-31 LAB — GLUCOSE, CAPILLARY
Glucose-Capillary: 190 mg/dL — ABNORMAL HIGH (ref 70–99)
Glucose-Capillary: 178 mg/dL — ABNORMAL HIGH (ref 70–99)
Glucose-Capillary: 167 mg/dL — ABNORMAL HIGH (ref 70–99)

## 2023-07-31 LAB — HIV ANTIBODY (ROUTINE TESTING W REFLEX): HIV Screen 4th Generation wRfx: NONREACTIVE

## 2023-07-31 LAB — FOLATE: Folate: 7.7 ng/mL (ref >5.9)

## 2023-07-31 LAB — BRAIN NATRIURETIC PEPTIDE: B Natriuretic Peptide: 529.8 pg/mL — ABNORMAL HIGH (ref 0.0–100.0)

## 2023-07-31 MED ORDER — ENOXAPARIN SODIUM 30 MG/0.3ML IJ SOSY
30.0000 mg | PREFILLED_SYRINGE | INTRAMUSCULAR | Status: DC
  Administered 2023-07-31 – 2023-08-02 (×3): 30 mg via SUBCUTANEOUS
  Filled 2023-07-31 (×3): qty 0.3

## 2023-07-31 MED ORDER — ENSURE ENLIVE PO LIQD
237.0000 mL | Freq: Two times a day (BID) | ORAL | Status: DC
  Administered 2023-07-31 – 2023-08-03 (×5): 237 mL via ORAL

## 2023-07-31 NOTE — Progress Notes (Signed)
  Echocardiogram 2D Echocardiogram has been performed.  Lucendia Herrlich 07/31/2023, 3:34 PM

## 2023-07-31 NOTE — Progress Notes (Signed)
   07/31/23 1602  TOC Brief Assessment  Insurance and Status Reviewed ([place] [employer])  Patient has primary care physician Yes (Do Not Disturb  Rema Fendt, NP)  Home environment has been reviewed From Home  lives with Daughters  Prior level of function: independent  Prior/Current Home Services No current home services  Social Drivers of Health Review SDOH reviewed no interventions necessary  Readmission risk has been reviewed Yes (17%)  Transition of care needs no transition of care needs at this time     NO TOC needs identified at this time   Please place [facility]  consult if any needs arise

## 2023-07-31 NOTE — Plan of Care (Signed)
  Problem: Education: Goal: Knowledge of General Education information will improve Description: Including pain rating scale, medication(s)/side effects and non-pharmacologic comfort measures Outcome: Progressing   Problem: Health Behavior/Discharge Planning: Goal: Ability to manage health-related needs will improve Outcome: Progressing   Problem: Clinical Measurements: Goal: Ability to maintain clinical measurements within normal limits will improve Outcome: Progressing Goal: Will remain free from infection Outcome: Progressing Goal: Diagnostic test results will improve Outcome: Progressing Goal: Respiratory complications will improve Outcome: Progressing Goal: Cardiovascular complication will be avoided Outcome: Progressing   Problem: Activity: Goal: Risk for activity intolerance will decrease Outcome: Progressing   Problem: Nutrition: Goal: Adequate nutrition will be maintained Outcome: Progressing   Problem: Coping: Goal: Level of anxiety will decrease Outcome: Progressing   Problem: Elimination: Goal: Will not experience complications related to bowel motility Outcome: Progressing Goal: Will not experience complications related to urinary retention Outcome: Progressing   Problem: Pain Management: Goal: General experience of comfort will improve Outcome: Progressing   Problem: Safety: Goal: Ability to remain free from injury will improve Outcome: Progressing   Problem: Skin Integrity: Goal: Risk for impaired skin integrity will decrease Outcome: Progressing   Problem: Activity: Goal: Ability to tolerate increased activity will improve Outcome: Progressing   Problem: Clinical Measurements: Goal: Ability to maintain a body temperature in the normal range will improve Outcome: Progressing   Problem: Respiratory: Goal: Ability to maintain adequate ventilation will improve Outcome: Progressing Goal: Ability to maintain a clear airway will improve Outcome:  Progressing   Problem: Education: Goal: Ability to describe self-care measures that may prevent or decrease complications (Diabetes Survival Skills Education) will improve Outcome: Progressing Goal: Individualized Educational Video(s) Outcome: Progressing   Problem: Coping: Goal: Ability to adjust to condition or change in health will improve Outcome: Progressing   Problem: Fluid Volume: Goal: Ability to maintain a balanced intake and output will improve Outcome: Progressing   Problem: Health Behavior/Discharge Planning: Goal: Ability to identify and utilize available resources and services will improve Outcome: Progressing Goal: Ability to manage health-related needs will improve Outcome: Progressing   Problem: Metabolic: Goal: Ability to maintain appropriate glucose levels will improve Outcome: Progressing   Problem: Nutritional: Goal: Maintenance of adequate nutrition will improve Outcome: Progressing Goal: Progress toward achieving an optimal weight will improve Outcome: Progressing   Problem: Skin Integrity: Goal: Risk for impaired skin integrity will decrease Outcome: Progressing   Problem: Tissue Perfusion: Goal: Adequacy of tissue perfusion will improve Outcome: Progressing

## 2023-07-31 NOTE — Progress Notes (Signed)
PROGRESS NOTE        PATIENT DETAILS Name: Jennifer Molina Age: 67 y.o. Sex: female Date of Birth: 11/25/1955 Admit Date: 07/30/2023 Admitting Physician Alberteen Sam, MD [id number], Amy J, NP  Brief Summary: Patient is a 67 y.o.  female with history of DM, HTN, normocytic anemia, MGUS-who presented with cough/fever/shortness of breath-was found to have multifocal pneumonia and subsequently admitted to the hospitalist service  Significant events: 12/12>> admit to [facility]  Significant studies: 12/12>> CT chest/abdomen/pelvis: Patchy airspace disease both lungs.  Significant microbiology data: 12/12>> respiratory virus panel:+ Rhinovirus 12/12>> COVID/influenza/RSV PCR: Negative 12/12>> blood culture: Pending  Procedures: None  Consults: None  Subjective: No complaints-lying comfortably in bed-on 2 L of oxygen-daughter at bedside and translating.  Feels overall better than yesterday.  Objective: Vitals: Blood pressure (!) 159/69, pulse 79, temperature 99.1 F (37.3 C), temperature source Oral, resp. rate (!) 24, SpO2 93%.   Exam: Gen Exam:Alert awake-not in any distress HEENT:atraumatic, normocephalic Chest: Few bibasilar rales CVS:S1S2 regular Abdomen:soft non tender, non distended Extremities:no edema Neurology: Non focal Skin: no rash  Pertinent Labs/Radiology:    Latest Ref Rng & Units 07/31/2023    5:20 AM 07/30/2023   10:20 AM 04/13/2023    9:50 AM  CBC  WBC 4.0 - 10.5 K/uL 8.9  12.7  4.5   Hemoglobin 12.0 - 15.0 g/dL 7.5  8.2  8.8   Hematocrit 36.0 - 46.0 % 22.6  24.9  26.3   Platelets 150 - 400 K/uL 341  368  179     Lab Results  Component Value Date   NA 132 (L) 07/31/2023   K 3.5 07/31/2023   CL 105 07/31/2023   CO2 22 07/31/2023      Assessment/Plan: Sepsis secondary to multifocal pneumonia Probable rhinovirus pneumonia-unclear if there is a bacterial component/superinfection.   Sepsis physiology  resolved Stable on just 2 L of oxygen Check procalcitonin levels Await blood cultures Continue Rocephin/Zithromax for now  Hyponatremia Mild Asymptomatic Check electrolytes periodically  AKI on CKD stage IIIb Mild AKI likely hemodynamically induced kidney injury Creatinine improving with supportive care-close to baseline  MGUS Outpatient follow-up with oncology  Normocytic anemia Chronic issue-suspect slight drop in hemoglobin is from acute illness/IV fluid dilution No evidence of hematochezia/melena Follow CBC periodically  HTN BP slightly on the higher side Continue amlodipine, Coreg, chlorthalidone Follow and resume ARB accordingly.  DM-2 (A1c 6.1 on May 2024) CBG stable on SSI Resume oral hypoglycemics on discharge  Dementia No formal diagnosis but mentioned in prior notes per H&P Delirium precautions  BMI Estimated body mass index is 27.01 kg/m as calculated from the following:   Height as of 01/05/23: 5\' 3"  (1.6 m).   Weight as of 04/13/23: 69.2 kg.   Code status:   Code Status: Full Code   DVT Prophylaxis: enoxaparin (LOVENOX) injection 40 mg Start: 07/30/23 2030   Family Communication: Daughter at bedside   Disposition Plan: Status is: Inpatient Remains inpatient appropriate because: Severity of illness   Planned Discharge Destination:Home   Diet: Diet Order             Diet Carb Modified Fluid consistency: Thin; Room service appropriate? Yes  Diet effective now                     Antimicrobial agents: Anti-infectives (From admission, onward)  Start     Dose/Rate Route Frequency Ordered Stop   07/31/23 1200  cefTRIAXone (ROCEPHIN) 2 g in sodium chloride 0.9 % 100 mL IVPB        2 g 200 mL/hr over 30 Minutes Intravenous Every 24 hours 07/30/23 1934 08/04/23 1159   07/31/23 1200  azithromycin (ZITHROMAX) 500 mg in sodium chloride 0.9 % 250 mL IVPB        500 mg 250 mL/hr over 60 Minutes Intravenous Every 24 hours 07/30/23 1934  08/05/23 1159   07/30/23 1130  cefTRIAXone (ROCEPHIN) 1 g in sodium chloride 0.9 % 100 mL IVPB        1 g 200 mL/hr over 30 Minutes Intravenous  Once 07/30/23 1119 07/30/23 1216   07/30/23 1130  azithromycin (ZITHROMAX) 500 mg in sodium chloride 0.9 % 250 mL IVPB        500 mg 250 mL/hr over 60 Minutes Intravenous  Once 07/30/23 1119 07/30/23 1357        MEDICATIONS: Scheduled Meds:  amLODipine  10 mg Oral Daily   aspirin EC  81 mg Oral Daily   atorvastatin  40 mg Oral Daily   carvedilol  6.25 mg Oral BID   chlorthalidone  50 mg Oral Daily   enoxaparin (LOVENOX) injection  40 mg Subcutaneous Q24H   feeding supplement  237 mL Oral BID BM   gabapentin  300 mg Oral QHS   insulin aspart  0-15 Units Subcutaneous TID WC   insulin aspart  0-5 Units Subcutaneous QHS   iron polysaccharides  150 mg Oral Daily   Continuous Infusions:  azithromycin     cefTRIAXone (ROCEPHIN)  IV     PRN Meds:.acetaminophen **OR** acetaminophen, albuterol, ondansetron **OR** ondansetron (ZOFRAN) IV   I have personally reviewed following labs and imaging studies  LABORATORY DATA: CBC: Recent Labs  Lab 07/30/23 1020 07/31/23 0520  WBC 12.7* 8.9  HGB 8.2* 7.5*  HCT 24.9* 22.6*  MCV 75.5* 74.3*  PLT 368 341    Basic Metabolic Panel: Recent Labs  Lab 07/30/23 1020 07/31/23 0520  NA 131* 132*  K 3.7 3.5  CL 100 105  CO2 22 22  GLUCOSE 149* 97  BUN 20 20  CREATININE 1.94* 1.74*  CALCIUM 8.1* 7.5*    GFR: CrCl cannot be calculated (Unknown ideal weight.).  Liver Function Tests: Recent Labs  Lab 07/30/23 1020  AST 21  ALT 15  ALKPHOS 51  BILITOT 0.6  PROT 6.4*  ALBUMIN 1.9*   Recent Labs  Lab 07/30/23 1020  LIPASE 31   No results for input(s): "AMMONIA" in the last 168 hours.  Coagulation Profile: No results for input(s): "INR", "PROTIME" in the last 168 hours.  Cardiac Enzymes: No results for input(s): "CKTOTAL", "CKMB", "CKMBINDEX", "TROPONINI" in the last 168  hours.  BNP (last 3 results) No results for input(s): "PROBNP" in the last 8760 hours.  Lipid Profile: No results for input(s): "CHOL", "HDL", "LDLCALC", "TRIG", "CHOLHDL", "LDLDIRECT" in the last 72 hours.  Thyroid Function Tests: No results for input(s): "TSH", "T4TOTAL", "FREET4", "T3FREE", "THYROIDAB" in the last 72 hours.  Anemia Panel: Recent Labs    07/31/23 0756  VITAMINB12 1,471*  FOLATE 7.7  FERRITIN 541*  TIBC 129*  IRON 17*  RETICCTPCT 1.5    Urine analysis:    Component Value Date/Time   COLORURINE YELLOW 07/30/2023 1645   APPEARANCEUR HAZY (A) 07/30/2023 1645   LABSPEC 1.011 07/30/2023 1645   PHURINE 6.0 07/30/2023 1645   GLUCOSEU 50 (  A) 07/30/2023 1645   HGBUR SMALL (A) 07/30/2023 1645   BILIRUBINUR NEGATIVE 07/30/2023 1645   KETONESUR NEGATIVE 07/30/2023 1645   PROTEINUR >=300 (A) 07/30/2023 1645   NITRITE NEGATIVE 07/30/2023 1645   LEUKOCYTESUR NEGATIVE 07/30/2023 1645    Sepsis Labs: Lactic Acid, Venous    Component Value Date/Time   LATICACIDVEN 0.7 07/30/2023 1143    MICROBIOLOGY: Recent Results (from the past 240 hours)  Resp panel by RT-PCR (RSV, Flu A&B, Covid)     Status: None   Collection Time: 07/30/23 11:12 AM   Specimen: Nasal Swab  Result Value Ref Range Status   SARS Coronavirus 2 by RT PCR NEGATIVE NEGATIVE Final   Influenza A by PCR NEGATIVE NEGATIVE Final   Influenza B by PCR NEGATIVE NEGATIVE Final    Comment: (NOTE) The Xpert Xpress SARS-CoV-2/FLU/RSV plus assay is intended as an aid in the diagnosis of influenza from Nasopharyngeal swab specimens and should not be used as a sole basis for treatment. Nasal washings and aspirates are unacceptable for Xpert Xpress SARS-CoV-2/FLU/RSV testing.  Fact Sheet for Patients: BloggerCourse.com  Fact Sheet for Healthcare Providers: SeriousBroker.it  This test is not yet approved or cleared by the [place] FDA and has been  authorized for detection and/or diagnosis of SARS-CoV-2 by FDA under an Emergency Use Authorization (EUA). This EUA will remain in effect (meaning this test can be used) for the duration of the COVID-19 declaration under Section 564(b)(1) of the Act, 21 U.S.C. section 360bbb-3(b)(1), unless the authorization is terminated or revoked.     Resp Syncytial Virus by PCR NEGATIVE NEGATIVE Final    Comment: (NOTE) Fact Sheet for Patients: BloggerCourse.com  Fact Sheet for Healthcare Providers: SeriousBroker.it  This test is not yet approved or cleared by the [place] FDA and has been authorized for detection and/or diagnosis of SARS-CoV-2 by FDA under an Emergency Use Authorization (EUA). This EUA will remain in effect (meaning this test can be used) for the duration of the COVID-19 declaration under Section 564(b)(1) of the Act, 21 U.S.C. section 360bbb-3(b)(1), unless the authorization is terminated or revoked.  Performed at [facility], [address], [place], [place] [address]   Respiratory (~20 pathogens) panel by PCR     Status: Abnormal   Collection Time: 07/30/23  4:45 PM   Specimen: Urine, Clean Catch; Respiratory  Result Value Ref Range Status   Adenovirus NOT DETECTED NOT DETECTED Final   Coronavirus 229E NOT DETECTED NOT DETECTED Final    Comment: (NOTE) The Coronavirus on the Respiratory Panel, DOES NOT test for the novel  Coronavirus (2019 nCoV)    Coronavirus HKU1 NOT DETECTED NOT DETECTED Final   Coronavirus NL63 NOT DETECTED NOT DETECTED Final   Coronavirus OC43 NOT DETECTED NOT DETECTED Final   Metapneumovirus NOT DETECTED NOT DETECTED Final   Rhinovirus / Enterovirus DETECTED (A) NOT DETECTED Final   Influenza A NOT DETECTED NOT DETECTED Final   Influenza B NOT DETECTED NOT DETECTED Final   Parainfluenza Virus 1 NOT DETECTED NOT DETECTED Final   Parainfluenza Virus 2 NOT DETECTED NOT DETECTED Final    Parainfluenza Virus 3 NOT DETECTED NOT DETECTED Final   Parainfluenza Virus 4 NOT DETECTED NOT DETECTED Final   Respiratory Syncytial Virus NOT DETECTED NOT DETECTED Final   Bordetella pertussis NOT DETECTED NOT DETECTED Final   Bordetella Parapertussis NOT DETECTED NOT DETECTED Final   Chlamydophila pneumoniae NOT DETECTED NOT DETECTED Final   Mycoplasma pneumoniae NOT DETECTED NOT DETECTED Final  Comment: Performed at [facility], [address], [place], [place] [address]    RADIOLOGY STUDIES/RESULTS: CT CHEST ABDOMEN PELVIS WO CONTRAST Result Date: 07/30/2023 CLINICAL DATA:  Sepsis.  History of multiple myeloma. EXAM: CT CHEST, ABDOMEN AND PELVIS WITHOUT CONTRAST TECHNIQUE: Multidetector CT imaging of the chest, abdomen and pelvis was performed following the standard protocol without IV contrast. RADIATION DOSE REDUCTION: This exam was performed according to the departmental dose-optimization program which includes automated exposure control, adjustment of the mA and/or kV according to patient size and/or use of iterative reconstruction technique. COMPARISON:  07/27/2019 and PET-CT 06/20/2022 FINDINGS: CT CHEST FINDINGS Cardiovascular: Limited evaluation of aortic arch due to motion artifact. Heart size is within normal limits. Small amount of pericardial fluid. Coronary artery calcifications. Mediastinum/Nodes: No significant mediastinal lymph node enlargement. Limited evaluation for hilar lymph nodes due to lack of intravascular contrast. No axillary lymph node enlargement. Lungs/Pleura: Trace left pleural fluid. Patchy airspace disease in both lungs. Airspace disease is most prominent in the bilateral lower lobes and the right upper lobe. Findings are suggestive for multifocal pneumonia. Focal consolidation or volume loss in the posterior left lung base. Musculoskeletal: No acute bone abnormality. CT ABDOMEN PELVIS FINDINGS Hepatobiliary: Mild gallbladder distension. Study has  limitations due to motion artifact. Pancreas: Unremarkable. No pancreatic ductal dilatation or surrounding inflammatory changes. Spleen: Normal in size without focal abnormality. Adrenals/Urinary Tract: Normal adrenal glands. Normal appearance of both kidneys without stones or hydronephrosis. Normal appearance of the urinary bladder. Stomach/Bowel: Normal stomach. No bowel dilatation or obstruction. Normal appearance of the appendix without acute inflammatory changes. There is oral contrast within the colon. Vascular/Lymphatic: Aortic atherosclerosis. No enlarged abdominal or pelvic lymph nodes. Reproductive: Fluid surrounding the uterus. No evidence for an adnexal mass. Other: Small amount of low-density fluid in the pelvis. Trace fluid in the right paracolic gutter and right lower quadrant of the abdomen. No evidence for free air. Musculoskeletal: Mild subcutaneous edema. No acute bone abnormality. IMPRESSION: 1. Patchy airspace disease in both lungs. Findings are suggestive for multifocal pneumonia. 2. Trace left pleural fluid. 3. Small amount of low-density fluid in the abdomen and pelvis. Findings are nonspecific but could be related to an acute infectious or inflammatory process. 4. Mild gallbladder distension. 5. Aortic Atherosclerosis (ICD10-I70.0). Electronically Signed   By: Richarda Overlie M.D.   On: 07/30/2023 14:52   DG Chest 2 View Result Date: 07/30/2023 CLINICAL DATA:  Shortness of breath.  Fever.  Cough. EXAM: CHEST - 2 VIEW COMPARISON:  Radiograph from 07/23/2019 and CT scan chest from 07/27/2019. FINDINGS: Redemonstration of reticulonodular opacities throughout bilateral lungs with relative sparing of the periphery, which appears grossly unchanged since multiple prior studies dating back to December 2020 and favored to represent post infective/inflammatory scarring. There is also left retrocardiac opacity which is grossly similar to the prior study; however, there is new trace left pleural  effusion. No right pleural effusion. No pneumothorax. Stable cardio-mediastinal silhouette. No acute osseous abnormalities. The soft tissues are within normal limits. IMPRESSION: *New left pleural effusion since the prior study. *Otherwise diffuse reticulonodular opacities throughout bilateral lungs with relative sparing of the periphery, which appear grossly similar to the prior CT scan chest from December 2020. However, superimposed new opacity particularly in the left retrocardiac region is difficult to exclude on the basis of this exam. Correlate clinically to determine the need for additional imaging with chest CT scan. Electronically Signed   By: Jules Schick M.D.   On: 07/30/2023 11:02  LOS: 1 day   Jeoffrey Massed, MD  [facility]    To contact the attending provider between 7A-7P or the covering provider during after hours 7P-7A, please log into the web site [email or web address] and access using universal [place] password for that web site. If you do not have the password, please call the hospital operator.  07/31/2023, 9:12 AM

## 2023-07-31 NOTE — Plan of Care (Signed)
  Problem: Clinical Measurements: Goal: Will remain free from infection Outcome: Progressing Goal: Diagnostic test results will improve Outcome: Progressing Goal: Respiratory complications will improve Outcome: Progressing   Problem: Nutrition: Goal: Adequate nutrition will be maintained Outcome: Progressing   Problem: Pain Management: Goal: General experience of comfort will improve Outcome: Progressing

## 2023-08-01 DIAGNOSIS — J9601 Acute respiratory failure with hypoxia: Secondary | ICD-10-CM | POA: Diagnosis not present

## 2023-08-01 DIAGNOSIS — J189 Pneumonia, unspecified organism: Secondary | ICD-10-CM | POA: Diagnosis not present

## 2023-08-01 DIAGNOSIS — N1832 Chronic kidney disease, stage 3b: Secondary | ICD-10-CM | POA: Diagnosis not present

## 2023-08-01 DIAGNOSIS — N179 Acute kidney failure, unspecified: Secondary | ICD-10-CM | POA: Diagnosis not present

## 2023-08-01 LAB — COMPREHENSIVE METABOLIC PANEL
ALT: 15 U/L (ref 0–44)
AST: 21 U/L (ref 15–41)
Albumin: 1.7 g/dL — ABNORMAL LOW (ref 3.5–5.0)
Alkaline Phosphatase: 42 U/L (ref 38–126)
Anion gap: 6 (ref 5–15)
BUN: 23 mg/dL (ref 8–23)
CO2: 24 mmol/L (ref 22–32)
Calcium: 8 mg/dL — ABNORMAL LOW (ref 8.9–10.3)
Chloride: 104 mmol/L (ref 98–111)
Creatinine, Ser: 1.92 mg/dL — ABNORMAL HIGH (ref 0.44–1.00)
GFR, Estimated: 28 mL/min — ABNORMAL LOW (ref >60)
Glucose, Bld: 127 mg/dL — ABNORMAL HIGH (ref 70–99)
Potassium: 3.5 mmol/L (ref 3.5–5.1)
Sodium: 134 mmol/L — ABNORMAL LOW (ref 135–145)
Total Bilirubin: 0.3 mg/dL (ref <1.2)
Total Protein: 5.6 g/dL — ABNORMAL LOW (ref 6.5–8.1)

## 2023-08-01 LAB — CBC
HCT: 21.2 % — ABNORMAL LOW (ref 36.0–46.0)
Hemoglobin: 6.9 g/dL — CL (ref 12.0–15.0)
MCH: 24.4 pg — ABNORMAL LOW (ref 26.0–34.0)
MCHC: 32.5 g/dL (ref 30.0–36.0)
MCV: 74.9 fL — ABNORMAL LOW (ref 80.0–100.0)
Platelets: 371 10*3/uL (ref 150–400)
RBC: 2.83 MIL/uL — ABNORMAL LOW (ref 3.87–5.11)
RDW: 15.7 % — ABNORMAL HIGH (ref 11.5–15.5)
WBC: 9.3 10*3/uL (ref 4.0–10.5)
nRBC: 0 % (ref 0.0–0.2)

## 2023-08-01 LAB — GLUCOSE, CAPILLARY
Glucose-Capillary: 121 mg/dL — ABNORMAL HIGH (ref 70–99)
Glucose-Capillary: 124 mg/dL — ABNORMAL HIGH (ref 70–99)
Glucose-Capillary: 143 mg/dL — ABNORMAL HIGH (ref 70–99)
Glucose-Capillary: 154 mg/dL — ABNORMAL HIGH (ref 70–99)

## 2023-08-01 LAB — HEMOGLOBIN AND HEMATOCRIT, BLOOD
HCT: 25.2 % — ABNORMAL LOW (ref 36.0–46.0)
Hemoglobin: 8.5 g/dL — ABNORMAL LOW (ref 12.0–15.0)

## 2023-08-01 LAB — PREPARE RBC (CROSSMATCH)

## 2023-08-01 MED ORDER — SODIUM CHLORIDE 0.9% IV SOLUTION: Freq: Once | INTRAVENOUS | Status: AC

## 2023-08-01 MED ORDER — CARVEDILOL 12.5 MG PO TABS
12.5000 mg | ORAL_TABLET | Freq: Two times a day (BID) | ORAL | Status: DC
  Administered 2023-08-01 – 2023-08-03 (×4): 12.5 mg via ORAL
  Filled 2023-08-01 (×4): qty 1

## 2023-08-01 MED ORDER — DIPHENHYDRAMINE HCL 25 MG PO CAPS
25.0000 mg | ORAL_CAPSULE | Freq: Once | ORAL | Status: AC
  Administered 2023-08-01: 25 mg via ORAL
  Filled 2023-08-01: qty 1

## 2023-08-01 MED ORDER — ACETAMINOPHEN 325 MG PO TABS
650.0000 mg | ORAL_TABLET | Freq: Once | ORAL | Status: AC
  Administered 2023-08-01: 650 mg via ORAL
  Filled 2023-08-01: qty 2

## 2023-08-01 NOTE — Progress Notes (Signed)
PROGRESS NOTE        PATIENT DETAILS Name: Jennifer Molina Age: 67 y.o. Sex: female Date of Birth: 1956-01-29 Admit Date: 07/30/2023 Admitting Physician Alberteen Sam, MD QIO:[id number], Amy J, NP  Brief Summary: Patient is a 67 y.o.  female with history of DM, HTN, normocytic anemia, MGUS-who presented with cough/fever/shortness of breath-was found to have multifocal pneumonia and subsequently admitted to the hospitalist service  Significant events: 12/12>> admit to [facility]  Significant studies: 12/12>> CT chest/abdomen/pelvis: Patchy airspace disease both lungs 12/13>> echo: EF 60-65%, small pericardial effusion-no tamponade  Significant microbiology data: 12/12>> respiratory virus panel:+ Rhinovirus 12/12>> COVID/influenza/RSV PCR: Negative 12/12>> blood culture: No growth  Procedures: None  Consults: None  Subjective: Feels better-down to 1 L of oxygen-no major issues overnight.  Objective: Vitals: Blood pressure (!) 163/79, pulse 89, temperature 98.4 F (36.9 C), temperature source Oral, resp. rate 18, SpO2 92%.   Exam: Gen Exam:Alert awake-not in any distress HEENT:atraumatic, normocephalic Chest: B/L clear to auscultation anteriorly CVS:S1S2 regular Abdomen:soft non tender, non distended Extremities:no edema Neurology: Non focal Skin: no rash  Pertinent Labs/Radiology:    Latest Ref Rng & Units 08/01/2023    4:32 AM 07/31/2023    5:20 AM 07/30/2023   10:20 AM  CBC  WBC 4.0 - 10.5 K/uL 9.3  8.9  12.7   Hemoglobin 12.0 - 15.0 g/dL 6.9  7.5  8.2   Hematocrit 36.0 - 46.0 % 21.2  22.6  24.9   Platelets 150 - 400 K/uL 371  341  368     Lab Results  Component Value Date   NA 134 (L) 08/01/2023   K 3.5 08/01/2023   CL 104 08/01/2023   CO2 24 08/01/2023      Assessment/Plan: Sepsis secondary to multifocal pneumonia Probable rhinovirus pneumonia-unclear if there is a bacterial component/superinfection-procalcitonin only  minimally elevated Sepsis physiology resolved Slowly improving-down to 1 L of oxygen Will plan on empiric-Rocephin x 5 days, Zithromax x 3 days Follow culture data. Attempt to wean off FiO2.  Hyponatremia Mild Asymptomatic Check electrolytes periodically  AKI on CKD stage IIIb Mild AKI likely hemodynamically induced kidney injury Hold chlorthalidone today Recheck electrolytes tomorrow.  Chronic HFpEF Volume status stable Holding chlorthalidone due to slight bump in creatinine today  MGUS Outpatient follow-up with oncology  Normocytic anemia Chronic issue-suspect slight drop in hemoglobin is from acute illness/IV fluid dilution No evidence of hematochezia/melena-yellow stools on 12/13 per patient Since Hb slowly downtrended to 6.9-will transfuse 1 unit of PRBC Follow CBC.  HTN BP on the high side Holding chlorthalidone due to slight bump in creatinine-olmesartan remains on hold  Increase Coreg to 12.5 twice daily Continue 10 mg of amlodipine relatively stable Follow/optimize  DM-2 (A1c 6.1 on May 2024) CBG stable on SSI Resume oral hypoglycemics on discharge  Recent Labs    07/31/23 1746 07/31/23 2104 08/01/23 0816  GLUCAP 178* 167* 121*    Dementia No formal diagnosis but mentioned in prior notes per H&P Delirium precautions  BMI Estimated body mass index is 27.01 kg/m as calculated from the following:   Height as of 01/05/23: 5\' 3"  (1.6 m).   Weight as of 04/13/23: 69.2 kg.   Code status:   Code Status: Full Code   DVT Prophylaxis: enoxaparin (LOVENOX) injection 30 mg Start: 07/31/23 2030   Family Communication: Daughter at bedside  Disposition Plan: Status is: Inpatient Remains inpatient appropriate because: Severity of illness   Planned Discharge Destination:Home   Diet: Diet Order             Diet Carb Modified Fluid consistency: Thin; Room service appropriate? Yes  Diet effective now                     Antimicrobial  agents: Anti-infectives (From admission, onward)    Start     Dose/Rate Route Frequency Ordered Stop   07/31/23 1200  cefTRIAXone (ROCEPHIN) 2 g in sodium chloride 0.9 % 100 mL IVPB        2 g 200 mL/hr over 30 Minutes Intravenous Every 24 hours 07/30/23 1934 08/04/23 1159   07/31/23 1200  azithromycin (ZITHROMAX) 500 mg in sodium chloride 0.9 % 250 mL IVPB        500 mg 250 mL/hr over 60 Minutes Intravenous Every 24 hours 07/30/23 1934 08/05/23 1159   07/30/23 1130  cefTRIAXone (ROCEPHIN) 1 g in sodium chloride 0.9 % 100 mL IVPB        1 g 200 mL/hr over 30 Minutes Intravenous  Once 07/30/23 1119 07/30/23 1216   07/30/23 1130  azithromycin (ZITHROMAX) 500 mg in sodium chloride 0.9 % 250 mL IVPB        500 mg 250 mL/hr over 60 Minutes Intravenous  Once 07/30/23 1119 07/30/23 1357        MEDICATIONS: Scheduled Meds:  sodium chloride   Intravenous Once   sodium chloride   Intravenous Once   amLODipine  10 mg Oral Daily   aspirin EC  81 mg Oral Daily   atorvastatin  40 mg Oral Daily   carvedilol  6.25 mg Oral BID   chlorthalidone  50 mg Oral Daily   enoxaparin (LOVENOX) injection  30 mg Subcutaneous Q24H   feeding supplement  237 mL Oral BID BM   gabapentin  300 mg Oral QHS   insulin aspart  0-15 Units Subcutaneous TID WC   insulin aspart  0-5 Units Subcutaneous QHS   iron polysaccharides  150 mg Oral Daily   Continuous Infusions:  azithromycin 500 mg (07/31/23 1444)   cefTRIAXone (ROCEPHIN)  IV 2 g (07/31/23 1300)   PRN Meds:.acetaminophen **OR** acetaminophen, albuterol, ondansetron **OR** ondansetron (ZOFRAN) IV   I have personally reviewed following labs and imaging studies  LABORATORY DATA: CBC: Recent Labs  Lab 07/30/23 1020 07/31/23 0520 08/01/23 0432  WBC 12.7* 8.9 9.3  HGB 8.2* 7.5* 6.9*  HCT 24.9* 22.6* 21.2*  MCV 75.5* 74.3* 74.9*  PLT 368 341 371    Basic Metabolic Panel: Recent Labs  Lab 07/30/23 1020 07/31/23 0520 08/01/23 0432  NA 131*  132* 134*  K 3.7 3.5 3.5  CL 100 105 104  CO2 22 22 24   GLUCOSE 149* 97 127*  BUN 20 20 23   CREATININE 1.94* 1.74* 1.92*  CALCIUM 8.1* 7.5* 8.0*    GFR: CrCl cannot be calculated (Unknown ideal weight.).  Liver Function Tests: Recent Labs  Lab 07/30/23 1020 08/01/23 0432  AST 21 21  ALT 15 15  ALKPHOS 51 42  BILITOT 0.6 0.3  PROT 6.4* 5.6*  ALBUMIN 1.9* 1.7*   Recent Labs  Lab 07/30/23 1020  LIPASE 31   No results for input(s): "AMMONIA" in the last 168 hours.  Coagulation Profile: No results for input(s): "INR", "PROTIME" in the last 168 hours.  Cardiac Enzymes: No results for input(s): "CKTOTAL", "CKMB", "CKMBINDEX", "TROPONINI" in the  last 168 hours.  BNP (last 3 results) No results for input(s): "PROBNP" in the last 8760 hours.  Lipid Profile: No results for input(s): "CHOL", "HDL", "LDLCALC", "TRIG", "CHOLHDL", "LDLDIRECT" in the last 72 hours.  Thyroid Function Tests: No results for input(s): "TSH", "T4TOTAL", "FREET4", "T3FREE", "THYROIDAB" in the last 72 hours.  Anemia Panel: Recent Labs    07/31/23 0756  VITAMINB12 1,471*  FOLATE 7.7  FERRITIN 541*  TIBC 129*  IRON 17*  RETICCTPCT 1.5    Urine analysis:    Component Value Date/Time   COLORURINE YELLOW 07/30/2023 1645   APPEARANCEUR HAZY (A) 07/30/2023 1645   LABSPEC 1.011 07/30/2023 1645   PHURINE 6.0 07/30/2023 1645   GLUCOSEU 50 (A) 07/30/2023 1645   HGBUR SMALL (A) 07/30/2023 1645   BILIRUBINUR NEGATIVE 07/30/2023 1645   KETONESUR NEGATIVE 07/30/2023 1645   PROTEINUR >=300 (A) 07/30/2023 1645   NITRITE NEGATIVE 07/30/2023 1645   LEUKOCYTESUR NEGATIVE 07/30/2023 1645    Sepsis Labs: Lactic Acid, Venous    Component Value Date/Time   LATICACIDVEN 0.7 07/30/2023 1143    MICROBIOLOGY: Recent Results (from the past 240 hours)  Culture, blood (routine x 2)     Status: None (Preliminary result)   Collection Time: 07/30/23 11:11 AM   Specimen: BLOOD  Result Value Ref Range  Status   Specimen Description BLOOD SITE NOT SPECIFIED  Final   Special Requests   Final    BOTTLES DRAWN AEROBIC AND ANAEROBIC Blood Culture results may not be optimal due to an inadequate volume of blood received in culture bottles   Culture   Final    NO GROWTH < 24 HOURS Performed at [facility], [address] [place], [place] [address]    Report Status PENDING  Incomplete  Resp panel by RT-PCR (RSV, Flu A&B, Covid)     Status: None   Collection Time: 07/30/23 11:12 AM   Specimen: Nasal Swab  Result Value Ref Range Status   SARS Coronavirus 2 by RT PCR NEGATIVE NEGATIVE Final   Influenza A by PCR NEGATIVE NEGATIVE Final   Influenza B by PCR NEGATIVE NEGATIVE Final    Comment: (NOTE) The Xpert Xpress SARS-CoV-2/FLU/RSV plus assay is intended as an aid in the diagnosis of influenza from Nasopharyngeal swab specimens and should not be used as a sole basis for treatment. Nasal washings and aspirates are unacceptable for Xpert Xpress SARS-CoV-2/FLU/RSV testing.  Fact Sheet for Patients: BloggerCourse.com  Fact Sheet for Healthcare Providers: SeriousBroker.it  This test is not yet approved or cleared by the [place] FDA and has been authorized for detection and/or diagnosis of SARS-CoV-2 by FDA under an Emergency Use Authorization (EUA). This EUA will remain in effect (meaning this test can be used) for the duration of the COVID-19 declaration under Section 564(b)(1) of the Act, 21 U.S.C. section 360bbb-3(b)(1), unless the authorization is terminated or revoked.     Resp Syncytial Virus by PCR NEGATIVE NEGATIVE Final    Comment: (NOTE) Fact Sheet for Patients: BloggerCourse.com  Fact Sheet for Healthcare Providers: SeriousBroker.it  This test is not yet approved or cleared by the [place] FDA and has been authorized for detection and/or diagnosis of  SARS-CoV-2 by FDA under an Emergency Use Authorization (EUA). This EUA will remain in effect (meaning this test can be used) for the duration of the COVID-19 declaration under Section 564(b)(1) of the Act, 21 U.S.C. section 360bbb-3(b)(1), unless the authorization is terminated or revoked.  Performed at [facility], [address] [place], [place] [address]   Respiratory (~20 pathogens) panel by PCR     Status: Abnormal   Collection Time: 07/30/23  4:45 PM   Specimen: Urine, Clean Catch; Respiratory  Result Value Ref Range Status   Adenovirus NOT DETECTED NOT DETECTED Final   Coronavirus 229E NOT DETECTED NOT DETECTED Final    Comment: (NOTE) The Coronavirus on the Respiratory Panel, DOES NOT test for the novel  Coronavirus (2019 nCoV)    Coronavirus HKU1 NOT DETECTED NOT DETECTED Final   Coronavirus NL63 NOT DETECTED NOT DETECTED Final   Coronavirus OC43 NOT DETECTED NOT DETECTED Final   Metapneumovirus NOT DETECTED NOT DETECTED Final   Rhinovirus / Enterovirus DETECTED (A) NOT DETECTED Final   Influenza A NOT DETECTED NOT DETECTED Final   Influenza B NOT DETECTED NOT DETECTED Final   Parainfluenza Virus 1 NOT DETECTED NOT DETECTED Final   Parainfluenza Virus 2 NOT DETECTED NOT DETECTED Final   Parainfluenza Virus 3 NOT DETECTED NOT DETECTED Final   Parainfluenza Virus 4 NOT DETECTED NOT DETECTED Final   Respiratory Syncytial Virus NOT DETECTED NOT DETECTED Final   Bordetella pertussis NOT DETECTED NOT DETECTED Final   Bordetella Parapertussis NOT DETECTED NOT DETECTED Final   Chlamydophila pneumoniae NOT DETECTED NOT DETECTED Final   Mycoplasma pneumoniae NOT DETECTED NOT DETECTED Final    Comment: Performed at [facility], [address], [place], [place] [address]  Culture, blood (routine x 2)     Status: None (Preliminary result)   Collection Time: 07/30/23  6:50 PM   Specimen: BLOOD  Result Value Ref Range Status   Specimen Description BLOOD SITE NOT  SPECIFIED  Final   Special Requests   Final    BOTTLES DRAWN AEROBIC AND ANAEROBIC Blood Culture results may not be optimal due to an inadequate volume of blood received in culture bottles   Culture   Final    NO GROWTH < 24 HOURS Performed at [facility], [address] [place], [place] [address]    Report Status PENDING  Incomplete    RADIOLOGY STUDIES/RESULTS: ECHOCARDIOGRAM COMPLETE Result Date: 07/31/2023    ECHOCARDIOGRAM REPORT   Patient Name:   Jennifer Molina Date of Exam: 07/31/2023 Medical Rec #:  [id number]  Height:       63.0 in Accession #:    [id number] Weight:       152.5 lb Date of Birth:  10-Jan-1956   BSA:          1.723 m Patient Age:    67 years   BP:           159/69 mmHg Patient Gender: F          HR:           87 bpm. Exam Location:  Inpatient Procedure: 2D Echo, 3D Echo, Cardiac Doppler and Color Doppler Indications:    Acutre respiratory distress R06.03  History:        Patient has prior history of Echocardiogram examinations, most                 recent 09/16/2021. Previous Myocardial Infarction, CKD, stage 3;                 Risk Factors:Hypertension, Diabetes and Dyslipidemia.  Sonographer:    Lucendia Herrlich RCS Referring Phys: [id number] Dewayne Shorter M Jalicia Roszak IMPRESSIONS  1. Left ventricular ejection fraction, by estimation, is 60 to 65%. Left ventricular ejection fraction by 3D volume is 58 %.  The left ventricle has normal function. The left ventricle has no regional wall motion abnormalities. There is mild left ventricular hypertrophy. Left ventricular diastolic parameters are indeterminate.  2. Right ventricular systolic function is normal. The right ventricular size is normal. Tricuspid regurgitation signal is inadequate for assessing PA pressure.  3. Left atrial size was mildly dilated.  4. A small pericardial effusion is present. There is no evidence of cardiac tamponade. BP 159/69, HR 87 bpm.  5. The mitral valve is normal in structure. Trivial mitral valve regurgitation. No  evidence of mitral stenosis.  6. The aortic valve is tricuspid. There is mild calcification of the aortic valve. Aortic valve regurgitation is not visualized. No aortic stenosis is present.  7. The inferior vena cava is normal in size with greater than 50% respiratory variability, suggesting right atrial pressure of 3 mmHg. FINDINGS  Left Ventricle: Left ventricular ejection fraction, by estimation, is 60 to 65%. Left ventricular ejection fraction by 3D volume is 58 %. The left ventricle has normal function. The left ventricle has no regional wall motion abnormalities. The left ventricular internal cavity size was normal in size. There is mild left ventricular hypertrophy. Left ventricular diastolic parameters are indeterminate. Right Ventricle: The right ventricular size is normal. No increase in right ventricular wall thickness. Right ventricular systolic function is normal. Tricuspid regurgitation signal is inadequate for assessing PA pressure. The tricuspid regurgitant velocity is 1.62 m/s, and with an assumed right atrial pressure of 3 mmHg, the estimated right ventricular systolic pressure is 13.5 mmHg. Left Atrium: Left atrial size was mildly dilated. Right Atrium: Right atrial size was normal in size. Pericardium: A small pericardial effusion is present. There is no evidence of cardiac tamponade. Presence of epicardial fat layer. Mitral Valve: The mitral valve is normal in structure. Trivial mitral valve regurgitation. No evidence of mitral valve stenosis. Tricuspid Valve: The tricuspid valve is normal in structure. Tricuspid valve regurgitation is trivial. No evidence of tricuspid stenosis. Aortic Valve: The aortic valve is tricuspid. There is mild calcification of the aortic valve. Aortic valve regurgitation is not visualized. No aortic stenosis is present. Aortic valve peak gradient measures 9.1 mmHg. Pulmonic Valve: The pulmonic valve was normal in structure. Pulmonic valve regurgitation is trivial. No  evidence of pulmonic stenosis. Aorta: The aortic root is normal in size and structure. Venous: The inferior vena cava is normal in size with greater than 50% respiratory variability, suggesting right atrial pressure of 3 mmHg. IAS/Shunts: No atrial level shunt detected by color flow Doppler.  LEFT VENTRICLE PLAX 2D LVIDd:         4.30 cm         Diastology LVIDs:         2.90 cm         LV e' medial:    4.24 cm/s LV PW:         1.20 cm         LV E/e' medial:  18.8 LV IVS:        0.90 cm         LV e' lateral:   4.68 cm/s LVOT diam:     2.00 cm         LV E/e' lateral: 17.1 LV SV:         66 LV SV Index:   38 LVOT Area:     3.14 cm        3D Volume EF  LV 3D EF:    Left                                             ventricul                                             ar                                             ejection                                             fraction                                             by 3D                                             volume is                                             58 %.                                 3D Volume EF:                                3D EF:        58 %                                LV EDV:       134 ml                                LV ESV:       56 ml                                LV SV:        78 ml RIGHT VENTRICLE             IVC RV S prime:     10.80 cm/s  IVC diam: 1.90 cm TAPSE (M-mode): 1.9 cm LEFT ATRIUM           Index        RIGHT ATRIUM           Index LA diam:      3.60  cm 2.09 cm/m   RA Area:     11.00 cm LA Vol (A2C): 41.8 ml 24.26 ml/m  RA Volume:   21.60 ml  12.54 ml/m LA Vol (A4C): 54.3 ml 31.51 ml/m  AORTIC VALVE AV Area (Vmax): 2.10 cm AV Vmax:        151.00 cm/s AV Peak Grad:   9.1 mmHg LVOT Vmax:      101.00 cm/s LVOT Vmean:     72.600 cm/s LVOT VTI:       0.209 m  AORTA Ao Root diam: 3.00 cm Ao Asc diam:  3.60 cm MITRAL VALVE                TRICUSPID VALVE MV Area (PHT): 3.99 cm     TR  Peak grad:   10.5 mmHg MV Decel Time: 190 msec     TR Vmax:        162.00 cm/s MV E velocity: 79.90 cm/s MV A velocity: 115.00 cm/s  SHUNTS MV E/A ratio:  0.69         Systemic VTI:  0.21 m                             Systemic Diam: 2.00 cm Weston Brass MD Electronically signed by Weston Brass MD Signature Date/Time: 07/31/2023/8:31:55 PM    Final    CT CHEST ABDOMEN PELVIS WO CONTRAST Result Date: 07/30/2023 CLINICAL DATA:  Sepsis.  History of multiple myeloma. EXAM: CT CHEST, ABDOMEN AND PELVIS WITHOUT CONTRAST TECHNIQUE: Multidetector CT imaging of the chest, abdomen and pelvis was performed following the standard protocol without IV contrast. RADIATION DOSE REDUCTION: This exam was performed according to the departmental dose-optimization program which includes automated exposure control, adjustment of the mA and/or kV according to patient size and/or use of iterative reconstruction technique. COMPARISON:  07/27/2019 and PET-CT 06/20/2022 FINDINGS: CT CHEST FINDINGS Cardiovascular: Limited evaluation of aortic arch due to motion artifact. Heart size is within normal limits. Small amount of pericardial fluid. Coronary artery calcifications. Mediastinum/Nodes: No significant mediastinal lymph node enlargement. Limited evaluation for hilar lymph nodes due to lack of intravascular contrast. No axillary lymph node enlargement. Lungs/Pleura: Trace left pleural fluid. Patchy airspace disease in both lungs. Airspace disease is most prominent in the bilateral lower lobes and the right upper lobe. Findings are suggestive for multifocal pneumonia. Focal consolidation or volume loss in the posterior left lung base. Musculoskeletal: No acute bone abnormality. CT ABDOMEN PELVIS FINDINGS Hepatobiliary: Mild gallbladder distension. Study has limitations due to motion artifact. Pancreas: Unremarkable. No pancreatic ductal dilatation or surrounding inflammatory changes. Spleen: Normal in size without focal abnormality.  Adrenals/Urinary Tract: Normal adrenal glands. Normal appearance of both kidneys without stones or hydronephrosis. Normal appearance of the urinary bladder. Stomach/Bowel: Normal stomach. No bowel dilatation or obstruction. Normal appearance of the appendix without acute inflammatory changes. There is oral contrast within the colon. Vascular/Lymphatic: Aortic atherosclerosis. No enlarged abdominal or pelvic lymph nodes. Reproductive: Fluid surrounding the uterus. No evidence for an adnexal mass. Other: Small amount of low-density fluid in the pelvis. Trace fluid in the right paracolic gutter and right lower quadrant of the abdomen. No evidence for free air. Musculoskeletal: Mild subcutaneous edema. No acute bone abnormality. IMPRESSION: 1. Patchy airspace disease in both lungs. Findings are suggestive for multifocal pneumonia. 2. Trace left pleural fluid. 3. Small amount of low-density fluid in the abdomen and pelvis. Findings are nonspecific but could be related to an acute  infectious or inflammatory process. 4. Mild gallbladder distension. 5. Aortic Atherosclerosis (ICD10-I70.0). Electronically Signed   By: Richarda Overlie M.D.   On: 07/30/2023 14:52     LOS: 2 days   Jeoffrey Massed, MD  [facility]    To contact the attending provider between 7A-7P or the covering provider during after hours 7P-7A, please log into the web site [email or web address] and access using universal [place] password for that web site. If you do not have the password, please call the hospital operator.  08/01/2023, 10:47 AM

## 2023-08-02 DIAGNOSIS — R6520 Severe sepsis without septic shock: Secondary | ICD-10-CM | POA: Diagnosis not present

## 2023-08-02 DIAGNOSIS — A419 Sepsis, unspecified organism: Secondary | ICD-10-CM | POA: Diagnosis not present

## 2023-08-02 DIAGNOSIS — J9601 Acute respiratory failure with hypoxia: Secondary | ICD-10-CM | POA: Diagnosis not present

## 2023-08-02 LAB — TYPE AND SCREEN
ABO/RH(D): AB POS
Antibody Screen: NEGATIVE
Unit division: 0

## 2023-08-02 LAB — CBC
HCT: 24.9 % — ABNORMAL LOW (ref 36.0–46.0)
Hemoglobin: 8.2 g/dL — ABNORMAL LOW (ref 12.0–15.0)
MCH: 25.8 pg — ABNORMAL LOW (ref 26.0–34.0)
MCHC: 32.9 g/dL (ref 30.0–36.0)
MCV: 78.3 fL — ABNORMAL LOW (ref 80.0–100.0)
Platelets: 366 10*3/uL (ref 150–400)
RBC: 3.18 MIL/uL — ABNORMAL LOW (ref 3.87–5.11)
RDW: 16.3 % — ABNORMAL HIGH (ref 11.5–15.5)
WBC: 8.9 10*3/uL (ref 4.0–10.5)
nRBC: 0 % (ref 0.0–0.2)

## 2023-08-02 LAB — GLUCOSE, CAPILLARY
Glucose-Capillary: 114 mg/dL — ABNORMAL HIGH (ref 70–99)
Glucose-Capillary: 203 mg/dL — ABNORMAL HIGH (ref 70–99)
Glucose-Capillary: 110 mg/dL — ABNORMAL HIGH (ref 70–99)
Glucose-Capillary: 162 mg/dL — ABNORMAL HIGH (ref 70–99)

## 2023-08-02 LAB — BASIC METABOLIC PANEL
Anion gap: 6 (ref 5–15)
BUN: 21 mg/dL (ref 8–23)
CO2: 23 mmol/L (ref 22–32)
Calcium: 8.4 mg/dL — ABNORMAL LOW (ref 8.9–10.3)
Chloride: 108 mmol/L (ref 98–111)
Creatinine, Ser: 1.82 mg/dL — ABNORMAL HIGH (ref 0.44–1.00)
GFR, Estimated: 30 mL/min — ABNORMAL LOW (ref >60)
Glucose, Bld: 132 mg/dL — ABNORMAL HIGH (ref 70–99)
Potassium: 4.1 mmol/L (ref 3.5–5.1)
Sodium: 137 mmol/L (ref 135–145)

## 2023-08-02 LAB — BPAM RBC
Blood Product Expiration Date: 202501152359
ISSUE DATE / TIME: 202412141050
Unit Type and Rh: 8400

## 2023-08-02 MED ORDER — SIMETHICONE 40 MG/0.6ML PO SUSP
40.0000 mg | Freq: Four times a day (QID) | ORAL | Status: DC | PRN
  Administered 2023-08-02 – 2023-08-03 (×2): 40 mg via ORAL
  Filled 2023-08-02 (×3): qty 0.6

## 2023-08-02 MED ORDER — HYDRALAZINE HCL 20 MG/ML IJ SOLN
10.0000 mg | Freq: Four times a day (QID) | INTRAMUSCULAR | Status: DC | PRN
  Filled 2023-08-02: qty 1

## 2023-08-02 MED ORDER — HYDRALAZINE HCL 50 MG PO TABS
50.0000 mg | ORAL_TABLET | Freq: Three times a day (TID) | ORAL | Status: DC
  Administered 2023-08-02 – 2023-08-03 (×4): 50 mg via ORAL
  Filled 2023-08-02 (×4): qty 1

## 2023-08-02 MED ORDER — SENNA 8.6 MG PO TABS
1.0000 | ORAL_TABLET | Freq: Every day | ORAL | Status: DC | PRN
  Administered 2023-08-02: 8.6 mg via ORAL
  Filled 2023-08-02: qty 1

## 2023-08-02 NOTE — Progress Notes (Signed)
SATURATION QUALIFICATIONS: (This note is used to comply with regulatory documentation for home oxygen)  Patient Saturations on Room Air at Rest = 94%  Patient Saturations on Room Air while Ambulating = 88%   Please briefly explain why patient needs home oxygen: At this time pt is above 88% while ambulating 200 ft on room air.   Harrel Carina, DPT, CLT  Acute Rehabilitation Services Office: [phone] (Secure chat preferred)

## 2023-08-02 NOTE — Progress Notes (Addendum)
PROGRESS NOTE        PATIENT DETAILS Name: Jennifer Molina Age: 67 y.o. Sex: female Date of Birth: [birth year] Admit Date: 07/30/2023 Admitting Physician Alberteen Sam, MD QMV:HQIONGEX, Amy J, NP  Brief Summary: Patient is a 67 y.o.  female with history of DM, HTN, normocytic anemia, MGUS-who presented with cough/fever/shortness of breath-was found to have multifocal pneumonia and subsequently admitted to the hospitalist service  Significant events: 12/12>> admit to [facility]  Significant studies: 12/12>> CT chest/abdomen/pelvis: Patchy airspace disease both lungs 12/13>> echo: EF 60-65%, small pericardial effusion-no tamponade  Significant microbiology data: 12/12>> respiratory virus panel:+ Rhinovirus 12/12>> COVID/influenza/RSV PCR: Negative 12/12>> blood culture: No growth  Procedures: None  Consults: None  Subjective:  Patient in bed, appears comfortable, denies any headache, no fever, no chest pain or pressure, no shortness of breath , no abdominal pain. No focal weakness.  Objective: Vitals: Blood pressure (!) 181/83, pulse 88, temperature 98.7 F (37.1 C), temperature source Oral, resp. rate 20, SpO2 90%.   Exam:  Awake Alert, No new F.N deficits, Normal affect [place].AT,PERRAL Supple Neck, No JVD,   Symmetrical Chest wall movement, Good air movement bilaterally, CTAB RRR,No Gallops, Rubs or new Murmurs,  +ve B.Sounds, Abd Soft, No tenderness,   No Cyanosis, Clubbing or edema    Assessment/Plan: Sepsis secondary to multifocal pneumonia Probable rhinovirus pneumonia-unclear if there is a bacterial component/superinfection-procalcitonin only minimally elevated Sepsis physiology resolved Slowly improving-down to 1 L of oxygen Will plan on empiric-Rocephin x 5 days, Zithromax x 3 days Follow culture data. Attempt to wean off FiO2.  Encouraged to sit in chair use I-S flutter valve for pulmonary toiletry, ambulate and check ambulatory  pulse ox.  Hyponatremia Mild Asymptomatic Check electrolytes periodically  AKI on CKD stage IIIb baseline creatinine around 1.7 Mild AKI likely hemodynamically induced kidney injury Improved and close to baseline continue to hold home diuretic  Chronic HFpEF Volume status stable Holding chlorthalidone due to slight bump in creatinine today  MGUS Outpatient follow-up with oncology  Normocytic anemia Chronic issue-suspect slight drop in hemoglobin is from acute illness/IV fluid dilution No evidence of hematochezia/melena-yellow stools on 12/13 per patient Since Hb slowly downtrended to 6.9-will transfuse 1 unit of PRBC Follow CBC.  HTN Home diuretic held, on Coreg and Norvasc, hydralazine added for better control  DM-2 (A1c 6.1 on May 2024) CBG stable on SSI Resume oral hypoglycemics on discharge  Recent Labs    08/01/23 1552 08/01/23 2206 08/02/23 0804  GLUCAP 143* 154* 114*    Dementia No formal diagnosis but mentioned in prior notes per H&P Delirium precautions  BMI Estimated body mass index is 27.01 kg/m as calculated from the following:   Height as of 01/05/23: 5\' 3"  (1.6 m).   Weight as of 04/13/23: 69.2 kg.   Code status:   Code Status: Full Code   DVT Prophylaxis: enoxaparin (LOVENOX) injection 30 mg Start: 07/31/23 2030   Family Communication: Son at bedside on 08/02/2023   Disposition Plan: Status is: Inpatient Remains inpatient appropriate because: Severity of illness   Planned Discharge Destination:Home   Diet: Diet Order             Diet Carb Modified Fluid consistency: Thin; Room service appropriate? Yes  Diet effective now  MEDICATIONS: Scheduled Meds:  amLODipine  10 mg Oral Daily   aspirin EC  81 mg Oral Daily   atorvastatin  40 mg Oral Daily   carvedilol  12.5 mg Oral BID   enoxaparin (LOVENOX) injection  30 mg Subcutaneous Q24H   feeding supplement  237 mL Oral BID BM   gabapentin  300 mg Oral QHS    hydrALAZINE  50 mg Oral Q8H   insulin aspart  0-15 Units Subcutaneous TID WC   insulin aspart  0-5 Units Subcutaneous QHS   iron polysaccharides  150 mg Oral Daily   Continuous Infusions:  azithromycin 500 mg (08/01/23 1356)   cefTRIAXone (ROCEPHIN)  IV 2 g (08/01/23 1348)   PRN Meds:.acetaminophen **OR** [DISCONTINUED] acetaminophen, albuterol, hydrALAZINE, [DISCONTINUED] ondansetron **OR** ondansetron (ZOFRAN) IV   I have personally reviewed following labs and imaging studies  LABORATORY DATA:  Recent Labs  Lab 07/30/23 1020 07/31/23 0520 08/01/23 0432 08/01/23 1501 08/02/23 0531  WBC 12.7* 8.9 9.3  --  8.9  HGB 8.2* 7.5* 6.9* 8.5* 8.2*  HCT 24.9* 22.6* 21.2* 25.2* 24.9*  PLT 368 341 371  --  366  MCV 75.5* 74.3* 74.9*  --  78.3*  MCH 24.8* 24.7* 24.4*  --  25.8*  MCHC 32.9 33.2 32.5  --  32.9  RDW 15.5 15.5 15.7*  --  16.3*    Recent Labs  Lab 07/30/23 1020 07/30/23 1143 07/31/23 0520 07/31/23 0945 08/01/23 0432 08/02/23 0531  NA 131*  --  132*  --  134* 137  K 3.7  --  3.5  --  3.5 4.1  CL 100  --  105  --  104 108  CO2 22  --  22  --  24 23  ANIONGAP 9  --  5  --  6 6  GLUCOSE 149*  --  97  --  127* 132*  BUN 20  --  20  --  23 21  CREATININE 1.94*  --  1.74*  --  1.92* 1.82*  AST 21  --   --   --  21  --   ALT 15  --   --   --  15  --   ALKPHOS 51  --   --   --  42  --   BILITOT 0.6  --   --   --  0.3  --   ALBUMIN 1.9*  --   --   --  1.7*  --   PROCALCITON  --   --   --  0.82  --   --   LATICACIDVEN  --  0.7  --   --   --   --   HGBA1C  --   --  5.9*  --   --   --   BNP  --   --   --  529.8*  --   --   CALCIUM 8.1*  --  7.5*  --  8.0* 8.4*    Lab Results  Component Value Date   CHOL 157 06/19/2021   HDL 41 06/19/2021   LDLCALC 80 06/19/2021   LDLDIRECT 60.0 11/20/2022   TRIG 218 (H) 06/19/2021   CHOLHDL 3.8 06/19/2021      Recent Labs  Lab 07/30/23 1020 07/30/23 1143 07/31/23 0520 07/31/23 0945 08/01/23 0432 08/02/23 0531   PROCALCITON  --   --   --  0.82  --   --   LATICACIDVEN  --  0.7  --   --   --   --  HGBA1C  --   --  5.9*  --   --   --   BNP  --   --   --  529.8*  --   --   CALCIUM 8.1*  --  7.5*  --  8.0* 8.4*    No results for input(s): "TSH", "T4TOTAL", "FREET4", "T3FREE", "THYROIDAB" in the last 72 hours.  Recent Labs    07/31/23 0756  VITAMINB12 1,471*  FOLATE 7.7  FERRITIN 541*  TIBC 129*  IRON 17*  RETICCTPCT 1.5      RADIOLOGY STUDIES/RESULTS: ECHOCARDIOGRAM COMPLETE Result Date: 07/31/2023    ECHOCARDIOGRAM REPORT   Patient Name:   SOMMAR BARBIE Date of Exam: 07/31/2023 Medical Rec #:  [id number]  Height:       63.0 in Accession #:    [id number] Weight:       152.5 lb Date of Birth:  1955/09/25   BSA:          1.723 m Patient Age:    67 years   BP:           159/69 mmHg Patient Gender: F          HR:           87 bpm. Exam Location:  Inpatient Procedure: 2D Echo, 3D Echo, Cardiac Doppler and Color Doppler Indications:    Acutre respiratory distress R06.03  History:        Patient has prior history of Echocardiogram examinations, most                 recent 09/16/2021. Previous Myocardial Infarction, CKD, stage 3;                 Risk Factors:Hypertension, Diabetes and Dyslipidemia.  Sonographer:    Lucendia Herrlich RCS Referring Phys: [id number] Dewayne Shorter M GHIMIRE IMPRESSIONS  1. Left ventricular ejection fraction, by estimation, is 60 to 65%. Left ventricular ejection fraction by 3D volume is 58 %. The left ventricle has normal function. The left ventricle has no regional wall motion abnormalities. There is mild left ventricular hypertrophy. Left ventricular diastolic parameters are indeterminate.  2. Right ventricular systolic function is normal. The right ventricular size is normal. Tricuspid regurgitation signal is inadequate for assessing PA pressure.  3. Left atrial size was mildly dilated.  4. A small pericardial effusion is present. There is no evidence of cardiac tamponade. BP 159/69, HR 87 bpm.   5. The mitral valve is normal in structure. Trivial mitral valve regurgitation. No evidence of mitral stenosis.  6. The aortic valve is tricuspid. There is mild calcification of the aortic valve. Aortic valve regurgitation is not visualized. No aortic stenosis is present.  7. The inferior vena cava is normal in size with greater than 50% respiratory variability, suggesting right atrial pressure of 3 mmHg. FINDINGS  Left Ventricle: Left ventricular ejection fraction, by estimation, is 60 to 65%. Left ventricular ejection fraction by 3D volume is 58 %. The left ventricle has normal function. The left ventricle has no regional wall motion abnormalities. The left ventricular internal cavity size was normal in size. There is mild left ventricular hypertrophy. Left ventricular diastolic parameters are indeterminate. Right Ventricle: The right ventricular size is normal. No increase in right ventricular wall thickness. Right ventricular systolic function is normal. Tricuspid regurgitation signal is inadequate for assessing PA pressure. The tricuspid regurgitant velocity is 1.62 m/s, and with an assumed right atrial pressure of 3 mmHg, the estimated right ventricular systolic pressure is 13.5 mmHg. Left  Atrium: Left atrial size was mildly dilated. Right Atrium: Right atrial size was normal in size. Pericardium: A small pericardial effusion is present. There is no evidence of cardiac tamponade. Presence of epicardial fat layer. Mitral Valve: The mitral valve is normal in structure. Trivial mitral valve regurgitation. No evidence of mitral valve stenosis. Tricuspid Valve: The tricuspid valve is normal in structure. Tricuspid valve regurgitation is trivial. No evidence of tricuspid stenosis. Aortic Valve: The aortic valve is tricuspid. There is mild calcification of the aortic valve. Aortic valve regurgitation is not visualized. No aortic stenosis is present. Aortic valve peak gradient measures 9.1 mmHg. Pulmonic Valve: The  pulmonic valve was normal in structure. Pulmonic valve regurgitation is trivial. No evidence of pulmonic stenosis. Aorta: The aortic root is normal in size and structure. Venous: The inferior vena cava is normal in size with greater than 50% respiratory variability, suggesting right atrial pressure of 3 mmHg. IAS/Shunts: No atrial level shunt detected by color flow Doppler.  LEFT VENTRICLE PLAX 2D LVIDd:         4.30 cm         Diastology LVIDs:         2.90 cm         LV e' medial:    4.24 cm/s LV PW:         1.20 cm         LV E/e' medial:  18.8 LV IVS:        0.90 cm         LV e' lateral:   4.68 cm/s LVOT diam:     2.00 cm         LV E/e' lateral: 17.1 LV SV:         66 LV SV Index:   38 LVOT Area:     3.14 cm        3D Volume EF                                LV 3D EF:    Left                                             ventricul                                             ar                                             ejection                                             fraction                                             by 3D  volume is                                             58 %.                                 3D Volume EF:                                3D EF:        58 %                                LV EDV:       134 ml                                LV ESV:       56 ml                                LV SV:        78 ml RIGHT VENTRICLE             IVC RV S prime:     10.80 cm/s  IVC diam: 1.90 cm TAPSE (M-mode): 1.9 cm LEFT ATRIUM           Index        RIGHT ATRIUM           Index LA diam:      3.60 cm 2.09 cm/m   RA Area:     11.00 cm LA Vol (A2C): 41.8 ml 24.26 ml/m  RA Volume:   21.60 ml  12.54 ml/m LA Vol (A4C): 54.3 ml 31.51 ml/m  AORTIC VALVE AV Area (Vmax): 2.10 cm AV Vmax:        151.00 cm/s AV Peak Grad:   9.1 mmHg LVOT Vmax:      101.00 cm/s LVOT Vmean:     72.600 cm/s LVOT VTI:       0.209 m  AORTA Ao Root diam: 3.00 cm Ao Asc diam:   3.60 cm MITRAL VALVE                TRICUSPID VALVE MV Area (PHT): 3.99 cm     TR Peak grad:   10.5 mmHg MV Decel Time: 190 msec     TR Vmax:        162.00 cm/s MV E velocity: 79.90 cm/s MV A velocity: 115.00 cm/s  SHUNTS MV E/A ratio:  0.69         Systemic VTI:  0.21 m                             Systemic Diam: 2.00 cm Weston Brass MD Electronically signed by Weston Brass MD Signature Date/Time: 07/31/2023/8:31:55 PM    Final      LOS: 3 days   Signature  -    Susa Raring M.D on 08/02/2023 at 8:26 AM   -  To page go to [email or web address]

## 2023-08-02 NOTE — Plan of Care (Signed)
  Problem: Education: Goal: Knowledge of General Education information will improve Description: Including pain rating scale, medication(s)/side effects and non-pharmacologic comfort measures Outcome: Progressing   Problem: Clinical Measurements: Goal: Ability to maintain clinical measurements within normal limits will improve Outcome: Progressing Goal: Respiratory complications will improve Outcome: Progressing   Problem: Safety: Goal: Ability to remain free from injury will improve Outcome: Progressing   

## 2023-08-02 NOTE — Evaluation (Signed)
Physical Therapy Evaluation Patient Details Name: Jennifer Molina MRN: [id number] DOB: [birth year] Today's Date: 08/02/2023  History of Present Illness  Pt is 67 yo presenting to [facility] ED due to cough, fever and shortness of breathe. Found to have multi focal pneumonia. PMH: DM, HTN, normocytic anemia, MGUS  Clinical Impression  Pt is presenting close to baseline level of functioning. Currently pt is CGA for sit to stand and gait with and without HHA due to mildly impaired balance. Pt O2 sats remained above 88% on Room air with gait. Pt has very supportive family. Due to pt current functional status, home set up and available assistance at home no recommended skilled physical therapy services at this time on discharge from acute care hospital setting. Will continue to follow in acute setting in order to ensure that pt returns home with decreased risk for falls, injury, re-hospitalization and improved activity tolerance.          If plan is discharge home, recommend the following: Help with stairs or ramp for entrance;Assistance with cooking/housework;Assist for transportation     Equipment Recommendations None recommended by PT     Functional Status Assessment Patient has had a recent decline in their functional status and demonstrates the ability to make significant improvements in function in a reasonable and predictable amount of time.     Precautions / Restrictions Precautions Precautions: Fall Restrictions Weight Bearing Restrictions Per Provider Order: No      Mobility  Bed Mobility Overal bed mobility: Modified Independent      Transfers Overall transfer level: Needs assistance Equipment used: 1 person hand held assist Transfers: Sit to/from Stand Sit to Stand: Contact guard assist           General transfer comment: steadying assist    Ambulation/Gait Ambulation/Gait assistance: Contact guard assist Gait Distance (Feet): 200 Feet Assistive device: 1 person hand held  assist, None Gait Pattern/deviations: Step-through pattern, Decreased stride length, Narrow base of support Gait velocity: decreased Gait velocity interpretation: <1.8 ft/sec, indicate of risk for recurrent falls   General Gait Details: minimal to no arm swing. O2 sats remained above 88% on room air      Balance Overall balance assessment: Mild deficits observed, not formally tested       Pertinent Vitals/Pain Pain Assessment Pain Assessment: No/denies pain    Home Living Family/patient expects to be discharged to:: Private residence Living Arrangements: Children (lives with her son) Available Help at Discharge: Family;Available 24 hours/day Type of Home: House Home Access: Stairs to enter Entrance Stairs-Rails: None Entrance Stairs-Number of Steps: 3 has a wall she can hold onto if she needs it   Home Layout: One level Home Equipment: Pharmacist, hospital (2 wheels)      Prior Function Prior Level of Function : Independent/Modified Independent;Working/employed             Mobility Comments: Ind with mobility pt works part time for about 4 hours/day and is on her feet pt denies falls but has fainted at work 2x ADLs Comments: ind with all ADL' s,     Extremity/Trunk Assessment   Upper Extremity Assessment Upper Extremity Assessment: Overall WFL for tasks assessed    Lower Extremity Assessment Lower Extremity Assessment: Overall WFL for tasks assessed    Cervical / Trunk Assessment Cervical / Trunk Assessment: Normal  Communication   Communication Communication: Other (comment) (interpreter used Guerry Bruin # [phone])  Cognition Arousal: Alert Behavior During Therapy: WFL for tasks assessed/performed Overall Cognitive Status: Within Functional Limits  for tasks assessed            General Comments General comments (skin integrity, edema, etc.): no noted skin abnormalities outside gown and pants. Pt daughter and SIL present throughout session. BP  181/83 in supine and 149/69 after ambulating.        Assessment/Plan    PT Assessment Patient needs continued PT services  PT Problem List Decreased activity tolerance;Decreased balance       PT Treatment Interventions DME instruction;Therapeutic exercise;Gait training;Balance training;Stair training;Functional mobility training;Therapeutic activities;Patient/family education    PT Goals (Current goals can be found in the Care Plan section)  Acute Rehab PT Goals Patient Stated Goal: to return home with family PT Goal Formulation: With patient Time For Goal Achievement: 08/16/23 Potential to Achieve Goals: Good    Frequency Min 1X/week        AM-PAC PT "6 Clicks" Mobility  Outcome Measure Help needed turning from your back to your side while in a flat bed without using bedrails?: None Help needed moving from lying on your back to sitting on the side of a flat bed without using bedrails?: None Help needed moving to and from a bed to a chair (including a wheelchair)?: A Little Help needed standing up from a chair using your arms (e.g., wheelchair or bedside chair)?: A Little Help needed to walk in hospital room?: A Little Help needed climbing 3-5 steps with a railing? : A Little 6 Click Score: 20    End of Session Equipment Utilized During Treatment: Gait belt Activity Tolerance: Patient tolerated treatment well Patient left: in bed;with call bell/phone within reach;with family/visitor present Nurse Communication: Mobility status PT Visit Diagnosis: Unsteadiness on feet (R26.81);Other abnormalities of gait and mobility (R26.89)    Time: 8119-1478 PT Time Calculation (min) (ACUTE ONLY): 45 min   Charges:   PT Evaluation $PT Eval Low Complexity: 1 Low PT Treatments $Therapeutic Activity: 23-37 mins PT General Charges $$ ACUTE PT VISIT: 1 Visit       Harrel Carina, DPT, CLT  Acute Rehabilitation Services Office: [phone] (Secure chat  preferred)   Claudia Desanctis 08/02/2023, 11:16 AM

## 2023-08-03 ENCOUNTER — Other Ambulatory Visit (HOSPITAL_COMMUNITY): Payer: Self-pay

## 2023-08-03 DIAGNOSIS — R6520 Severe sepsis without septic shock: Secondary | ICD-10-CM | POA: Diagnosis not present

## 2023-08-03 DIAGNOSIS — J9601 Acute respiratory failure with hypoxia: Secondary | ICD-10-CM | POA: Diagnosis not present

## 2023-08-03 DIAGNOSIS — A419 Sepsis, unspecified organism: Secondary | ICD-10-CM | POA: Diagnosis not present

## 2023-08-03 LAB — GLUCOSE, CAPILLARY: Glucose-Capillary: 126 mg/dL — ABNORMAL HIGH (ref 70–99)

## 2023-08-03 MED ORDER — AZITHROMYCIN 500 MG PO TABS
500.0000 mg | ORAL_TABLET | Freq: Every day | ORAL | 0 refills | Status: AC
  Filled 2023-08-03: qty 2, 2d supply, fill #0

## 2023-08-03 MED ORDER — HYDRALAZINE HCL 50 MG PO TABS
100.0000 mg | ORAL_TABLET | Freq: Three times a day (TID) | ORAL | Status: DC
Start: 2023-08-03 — End: 2023-08-03

## 2023-08-03 MED ORDER — HYDRALAZINE HCL 100 MG PO TABS
100.0000 mg | ORAL_TABLET | Freq: Three times a day (TID) | ORAL | 0 refills | Status: DC
  Filled 2023-08-03: qty 50, 17d supply, fill #0

## 2023-08-03 MED ORDER — CARVEDILOL 12.5 MG PO TABS
12.5000 mg | ORAL_TABLET | Freq: Two times a day (BID) | ORAL | 0 refills | Status: DC
  Filled 2023-08-03: qty 60, 30d supply, fill #0

## 2023-08-03 MED ORDER — AMLODIPINE BESYLATE 10 MG PO TABS
10.0000 mg | ORAL_TABLET | Freq: Every day | ORAL | 0 refills | Status: DC
  Filled 2023-08-03: qty 30, 30d supply, fill #0

## 2023-08-03 NOTE — Discharge Summary (Signed)
Jennifer Molina ZOX:[id number] DOB: 23-Sep-1955 DOA: 07/30/2023  PCP: Rema Fendt, NP  Admit date: 07/30/2023  Discharge date: 08/03/2023  Admitted From: Home   Disposition:  Home   Recommendations for Outpatient Follow-up:   Follow up with PCP in 1-2 weeks  PCP Please obtain BMP/CBC, 2 view CXR in 1week,  (see Discharge instructions)   PCP Please follow up on the following pending results:    Home Health: None   Equipment/Devices: o2  Consultations: None  Discharge Condition: Stable    CODE STATUS: Full    Diet Recommendation: Heart Healthy Low Carb    Chief Complaint  Patient presents with   Shortness of Breath     Brief history of present illness from the day of admission and additional interim summary    67 y.o.  female with history of DM, HTN, normocytic anemia, MGUS-who presented with cough/fever/shortness of breath-was found to have multifocal pneumonia and subsequently admitted to the hospitalist service   Significant events: 12/12>> admit to [facility]   Significant studies: 12/12>> CT chest/abdomen/pelvis: Patchy airspace disease both lungs 12/13>> echo: EF 60-65%, small pericardial effusion-no tamponade   Significant microbiology data: 12/12>> respiratory virus panel:+ Rhinovirus 12/12>> COVID/influenza/RSV PCR: Negative 12/12>> blood culture: No growth                                                                 Hospital Course   Sepsis secondary to rhinovirus multifocal pneumonia possible mild secondary bacterial infection. Treated supportively, 3 days of antibiotics, symptoms almost completely resolved, on room air at rest upon ambulation requires 1 to 2 L of oxygen which she will get upon discharge, PCP to repeat CBC, two-view chest x-ray.  BMP in 7 to 10 days and do pulse ox evaluation  again.  2 more doses of azithromycin upon discharge.     AKI on CKD stage IIIb baseline creatinine around 1.7 Mild AKI likely hemodynamically induced kidney injury After hydration renal function close to baseline, ARB diuretic combination held upon discharge, PCP to monitor in the outpatient setting.   Chronic HFpEF Volume status stable Holding diuretic due to AKI.   MGUS Outpatient follow-up with oncology   Normocytic anemia Chronic issue-suspect slight drop in hemoglobin is from acute illness/IV fluid dilution, post 1 unit of packed RBC on the next day of admission, CBC now stable, PCP to do outpatient appropriate anemia workup   HTN Home diuretic held, diuretic and ARB discontinued due to AKI, placed on higher than home dose Coreg, Norvasc and hydralazine.  PCP to monitor and adjust.   DM-2 (A1c 6.1 on May 2024) CBG stable new home regimen and follow-up with PCP  Discharge diagnosis     Principal Problem:   Sepsis (HCC) due to multifocal pneumonia Active Problems:   Uncontrolled type 2 diabetes  mellitus with hyperglycemia, without long-term current use of insulin (HCC)   Hypertension   Dementia (HCC)   Acute respiratory failure with hypoxia (HCC)   Hyperlipidemia   Acute renal failure superimposed on stage 3b chronic kidney disease (HCC)   Hyponatremia   MGUS (monoclonal gammopathy of unknown significance)   Anemia in chronic kidney disease (CKD)   Myocardial injury    Discharge instructions    Discharge Instructions     Discharge instructions   Complete by: As directed    Follow with Primary MD Rema Fendt, NP in 7 days   Get CBC, CMP, Magnesium, 2 view Chest X ray -  checked next visit with your primary MD   Activity: As tolerated with Full fall precautions use walker/cane & assistance as needed  Disposition Home    Diet: Heart Healthy Low Carb, CBGs q. ACH S.   Special Instructions: If you have smoked or chewed Tobacco  in the last 2 yrs please stop  smoking, stop any regular Alcohol  and or any Recreational drug use.  On your next visit with your primary care physician please Get Medicines reviewed and adjusted.  Please request your Prim.MD to go over all Hospital Tests and Procedure/Radiological results at the follow up, please get all Hospital records sent to your Prim MD by signing hospital release before you go home.  If you experience worsening of your admission symptoms, develop shortness of breath, life threatening emergency, suicidal or homicidal thoughts you must seek medical attention immediately by calling 911 or calling your MD immediately  if symptoms less severe.  You Must read complete instructions/literature along with all the possible adverse reactions/side effects for all the Medicines you take and that have been prescribed to you. Take any new Medicines after you have completely understood and accpet all the possible adverse reactions/side effects.   Do not drive when taking Pain medications.  Do not take more than prescribed Pain, Sleep and Anxiety Medications   Increase activity slowly   Complete by: As directed        Discharge Medications   Allergies as of 08/03/2023   No Known Allergies      Medication List     STOP taking these medications    amLODipine-olmesartan 10-40 MG tablet Commonly known as: AZOR   chlorthalidone 50 MG tablet Commonly known as: HYGROTON       TAKE these medications    acetaminophen 500 MG tablet Commonly known as: TYLENOL Take 1,000 mg by mouth every 6 (six) hours as needed for mild pain or fever.   amLODipine 10 MG tablet Commonly known as: NORVASC Ngy u?ng 1 l?n, m?i l?n 1 vin (t?ng c?ng 10 mg). (Take 1 tablet (10 mg total) by mouth daily.) Start taking on: August 04, 2023   Aspirin Low Dose 81 MG tablet Generic drug: aspirin EC TAKE 1 TABLET(81 MG) BY MOUTH DAILY What changed: See the new instructions.   atorvastatin 40 MG tablet Commonly known as:  LIPITOR Take 1 tablet by mouth once daily   azithromycin 500 MG tablet Commonly known as: Zithromax Take 1 tablet (500 mg total) by mouth daily.   carvedilol 12.5 MG tablet Commonly known as: COREG Take 1 tablet (12.5 mg total) by mouth 2 (two) times daily. What changed:  medication strength how much to take   dapagliflozin propanediol 10 MG Tabs tablet Commonly known as: Farxiga TAKE 1 TABLET(10 MG) BY MOUTH DAILY BEFORE BREAKFAST   gabapentin 300 MG capsule Commonly known  as: NEURONTIN TAKE 1 CAPSULE(300 MG) BY MOUTH AT BEDTIME What changed: See the new instructions.   glimepiride 2 MG tablet Commonly known as: AMARYL TAKE 1 TABLET(2 MG) BY MOUTH DAILY WITH SUPPER What changed: See the new instructions.   hydrALAZINE 100 MG tablet Commonly known as: APRESOLINE Take 1 tablet (100 mg total) by mouth every 8 (eight) hours.   iron polysaccharides 150 MG capsule Commonly known as: NIFEREX Take 1 capsule (150 mg total) by mouth daily.   metFORMIN 500 MG 24 hr tablet Commonly known as: GLUCOPHAGE-XR Take 2 tablets (1,000 mg total) by mouth daily.   True Metrix Blood Glucose Test test strip Generic drug: glucose blood Use as instructed   Accu-Chek Guide test strip Generic drug: glucose blood 1 each by Other route daily. And lancets 1/day   True Metrix Meter w/Device Kit Use as directed   Accu-Chek Guide Me w/Device Kit Use as instructed to check blood sugars   TRUEplus Lancets 28G Misc Use as directed               Durable Medical Equipment  (From admission, onward)           Start     Ordered   08/03/23 0834  For home use only DME oxygen  Once       Question Answer Comment  Length of Need 6 Months   Mode or (Route) Nasal cannula   Liters per Minute 2   Frequency Continuous (stationary and portable oxygen unit needed)   Oxygen conserving device Yes   Oxygen delivery system Gas      08/03/23 0833             Follow-up Information      Rema Fendt, NP. Schedule an appointment as soon as possible for a visit in 1 week(s).   Specialty: Nurse Practitioner Contact information: [address] [place] [address] [phone]                 Major procedures and Radiology Reports - PLEASE review detailed and final reports thoroughly  -      ECHOCARDIOGRAM COMPLETE Result Date: 07/31/2023    ECHOCARDIOGRAM REPORT   Patient Name:   Jennifer Molina Date of Exam: 07/31/2023 Medical Rec #:  [id number]  Height:       63.0 in Accession #:    [id number] Weight:       152.5 lb Date of Birth:  08-09-1956   BSA:          1.723 m Patient Age:    67 years   BP:           159/69 mmHg Patient Gender: F          HR:           87 bpm. Exam Location:  Inpatient Procedure: 2D Echo, 3D Echo, Cardiac Doppler and Color Doppler Indications:    Acutre respiratory distress R06.03  History:        Patient has prior history of Echocardiogram examinations, most                 recent 09/16/2021. Previous Myocardial Infarction, CKD, stage 3;                 Risk Factors:Hypertension, Diabetes and Dyslipidemia.  Sonographer:    Lucendia Herrlich RCS Referring Phys: [id number] Dewayne Shorter M GHIMIRE IMPRESSIONS  1. Left ventricular ejection fraction, by estimation, is 60 to 65%. Left ventricular ejection fraction  by 3D volume is 58 %. The left ventricle has normal function. The left ventricle has no regional wall motion abnormalities. There is mild left ventricular hypertrophy. Left ventricular diastolic parameters are indeterminate.  2. Right ventricular systolic function is normal. The right ventricular size is normal. Tricuspid regurgitation signal is inadequate for assessing PA pressure.  3. Left atrial size was mildly dilated.  4. A small pericardial effusion is present. There is no evidence of cardiac tamponade. BP 159/69, HR 87 bpm.  5. The mitral valve is normal in structure. Trivial mitral valve regurgitation. No evidence of mitral stenosis.  6. The  aortic valve is tricuspid. There is mild calcification of the aortic valve. Aortic valve regurgitation is not visualized. No aortic stenosis is present.  7. The inferior vena cava is normal in size with greater than 50% respiratory variability, suggesting right atrial pressure of 3 mmHg. FINDINGS  Left Ventricle: Left ventricular ejection fraction, by estimation, is 60 to 65%. Left ventricular ejection fraction by 3D volume is 58 %. The left ventricle has normal function. The left ventricle has no regional wall motion abnormalities. The left ventricular internal cavity size was normal in size. There is mild left ventricular hypertrophy. Left ventricular diastolic parameters are indeterminate. Right Ventricle: The right ventricular size is normal. No increase in right ventricular wall thickness. Right ventricular systolic function is normal. Tricuspid regurgitation signal is inadequate for assessing PA pressure. The tricuspid regurgitant velocity is 1.62 m/s, and with an assumed right atrial pressure of 3 mmHg, the estimated right ventricular systolic pressure is 13.5 mmHg. Left Atrium: Left atrial size was mildly dilated. Right Atrium: Right atrial size was normal in size. Pericardium: A small pericardial effusion is present. There is no evidence of cardiac tamponade. Presence of epicardial fat layer. Mitral Valve: The mitral valve is normal in structure. Trivial mitral valve regurgitation. No evidence of mitral valve stenosis. Tricuspid Valve: The tricuspid valve is normal in structure. Tricuspid valve regurgitation is trivial. No evidence of tricuspid stenosis. Aortic Valve: The aortic valve is tricuspid. There is mild calcification of the aortic valve. Aortic valve regurgitation is not visualized. No aortic stenosis is present. Aortic valve peak gradient measures 9.1 mmHg. Pulmonic Valve: The pulmonic valve was normal in structure. Pulmonic valve regurgitation is trivial. No evidence of pulmonic stenosis. Aorta:  The aortic root is normal in size and structure. Venous: The inferior vena cava is normal in size with greater than 50% respiratory variability, suggesting right atrial pressure of 3 mmHg. IAS/Shunts: No atrial level shunt detected by color flow Doppler.  LEFT VENTRICLE PLAX 2D LVIDd:         4.30 cm         Diastology LVIDs:         2.90 cm         LV e' medial:    4.24 cm/s LV PW:         1.20 cm         LV E/e' medial:  18.8 LV IVS:        0.90 cm         LV e' lateral:   4.68 cm/s LVOT diam:     2.00 cm         LV E/e' lateral: 17.1 LV SV:         66 LV SV Index:   38 LVOT Area:     3.14 cm        3D Volume EF  LV 3D EF:    Left                                             ventricul                                             ar                                             ejection                                             fraction                                             by 3D                                             volume is                                             58 %.                                 3D Volume EF:                                3D EF:        58 %                                LV EDV:       134 ml                                LV ESV:       56 ml                                LV SV:        78 ml RIGHT VENTRICLE             IVC RV S prime:     10.80 cm/s  IVC diam: 1.90 cm TAPSE (M-mode): 1.9 cm LEFT ATRIUM           Index        RIGHT ATRIUM           Index LA diam:      3.60  cm 2.09 cm/m   RA Area:     11.00 cm LA Vol (A2C): 41.8 ml 24.26 ml/m  RA Volume:   21.60 ml  12.54 ml/m LA Vol (A4C): 54.3 ml 31.51 ml/m  AORTIC VALVE AV Area (Vmax): 2.10 cm AV Vmax:        151.00 cm/s AV Peak Grad:   9.1 mmHg LVOT Vmax:      101.00 cm/s LVOT Vmean:     72.600 cm/s LVOT VTI:       0.209 m  AORTA Ao Root diam: 3.00 cm Ao Asc diam:  3.60 cm MITRAL VALVE                TRICUSPID VALVE MV Area (PHT): 3.99 cm     TR Peak grad:   10.5 mmHg MV Decel Time: 190  msec     TR Vmax:        162.00 cm/s MV E velocity: 79.90 cm/s MV A velocity: 115.00 cm/s  SHUNTS MV E/A ratio:  0.69         Systemic VTI:  0.21 m                             Systemic Diam: 2.00 cm Weston Brass MD Electronically signed by Weston Brass MD Signature Date/Time: 07/31/2023/8:31:55 PM    Final    CT CHEST ABDOMEN PELVIS WO CONTRAST Result Date: 07/30/2023 CLINICAL DATA:  Sepsis.  History of multiple myeloma. EXAM: CT CHEST, ABDOMEN AND PELVIS WITHOUT CONTRAST TECHNIQUE: Multidetector CT imaging of the chest, abdomen and pelvis was performed following the standard protocol without IV contrast. RADIATION DOSE REDUCTION: This exam was performed according to the departmental dose-optimization program which includes automated exposure control, adjustment of the mA and/or kV according to patient size and/or use of iterative reconstruction technique. COMPARISON:  07/27/2019 and PET-CT 06/20/2022 FINDINGS: CT CHEST FINDINGS Cardiovascular: Limited evaluation of aortic arch due to motion artifact. Heart size is within normal limits. Small amount of pericardial fluid. Coronary artery calcifications. Mediastinum/Nodes: No significant mediastinal lymph node enlargement. Limited evaluation for hilar lymph nodes due to lack of intravascular contrast. No axillary lymph node enlargement. Lungs/Pleura: Trace left pleural fluid. Patchy airspace disease in both lungs. Airspace disease is most prominent in the bilateral lower lobes and the right upper lobe. Findings are suggestive for multifocal pneumonia. Focal consolidation or volume loss in the posterior left lung base. Musculoskeletal: No acute bone abnormality. CT ABDOMEN PELVIS FINDINGS Hepatobiliary: Mild gallbladder distension. Study has limitations due to motion artifact. Pancreas: Unremarkable. No pancreatic ductal dilatation or surrounding inflammatory changes. Spleen: Normal in size without focal abnormality. Adrenals/Urinary Tract: Normal adrenal  glands. Normal appearance of both kidneys without stones or hydronephrosis. Normal appearance of the urinary bladder. Stomach/Bowel: Normal stomach. No bowel dilatation or obstruction. Normal appearance of the appendix without acute inflammatory changes. There is oral contrast within the colon. Vascular/Lymphatic: Aortic atherosclerosis. No enlarged abdominal or pelvic lymph nodes. Reproductive: Fluid surrounding the uterus. No evidence for an adnexal mass. Other: Small amount of low-density fluid in the pelvis. Trace fluid in the right paracolic gutter and right lower quadrant of the abdomen. No evidence for free air. Musculoskeletal: Mild subcutaneous edema. No acute bone abnormality. IMPRESSION: 1. Patchy airspace disease in both lungs. Findings are suggestive for multifocal pneumonia. 2. Trace left pleural fluid. 3. Small amount of low-density fluid in the abdomen and pelvis. Findings are nonspecific but could be related to an acute  infectious or inflammatory process. 4. Mild gallbladder distension. 5. Aortic Atherosclerosis (ICD10-I70.0). Electronically Signed   By: Richarda Overlie M.D.   On: 07/30/2023 14:52   DG Chest 2 View Result Date: 07/30/2023 CLINICAL DATA:  Shortness of breath.  Fever.  Cough. EXAM: CHEST - 2 VIEW COMPARISON:  Radiograph from 07/23/2019 and CT scan chest from 07/27/2019. FINDINGS: Redemonstration of reticulonodular opacities throughout bilateral lungs with relative sparing of the periphery, which appears grossly unchanged since multiple prior studies dating back to December 2020 and favored to represent post infective/inflammatory scarring. There is also left retrocardiac opacity which is grossly similar to the prior study; however, there is new trace left pleural effusion. No right pleural effusion. No pneumothorax. Stable cardio-mediastinal silhouette. No acute osseous abnormalities. The soft tissues are within normal limits. IMPRESSION: *New left pleural effusion since the prior  study. *Otherwise diffuse reticulonodular opacities throughout bilateral lungs with relative sparing of the periphery, which appear grossly similar to the prior CT scan chest from December 2020. However, superimposed new opacity particularly in the left retrocardiac region is difficult to exclude on the basis of this exam. Correlate clinically to determine the need for additional imaging with chest CT scan. Electronically Signed   By: Jules Schick M.D.   On: 07/30/2023 11:02    Today   Subjective    Jennifer Molina today has no headache,no chest abdominal pain,no new weakness tingling or numbness, feels much better wants to go home today.    Objective   Blood pressure (!) 166/73, pulse 90, temperature 98.4 F (36.9 C), temperature source Oral, resp. rate 20, SpO2 95%.  No intake or output data in the 24 hours ending 08/03/23 0907  Exam  Awake Alert, No new F.N deficits,    [place].AT,PERRAL Supple Neck,   Symmetrical Chest wall movement, Good air movement bilaterally, CTAB RRR,No Gallops,   +ve B.Sounds, Abd Soft, Non tender,  No Cyanosis, Clubbing or edema    Data Review   Recent Labs  Lab 07/30/23 1020 07/31/23 0520 08/01/23 0432 08/01/23 1501 08/02/23 0531  WBC 12.7* 8.9 9.3  --  8.9  HGB 8.2* 7.5* 6.9* 8.5* 8.2*  HCT 24.9* 22.6* 21.2* 25.2* 24.9*  PLT 368 341 371  --  366  MCV 75.5* 74.3* 74.9*  --  78.3*  MCH 24.8* 24.7* 24.4*  --  25.8*  MCHC 32.9 33.2 32.5  --  32.9  RDW 15.5 15.5 15.7*  --  16.3*    Recent Labs  Lab 07/30/23 1020 07/30/23 1143 07/31/23 0520 07/31/23 0945 08/01/23 0432 08/02/23 0531  NA 131*  --  132*  --  134* 137  K 3.7  --  3.5  --  3.5 4.1  CL 100  --  105  --  104 108  CO2 22  --  22  --  24 23  ANIONGAP 9  --  5  --  6 6  GLUCOSE 149*  --  97  --  127* 132*  BUN 20  --  20  --  23 21  CREATININE 1.94*  --  1.74*  --  1.92* 1.82*  AST 21  --   --   --  21  --   ALT 15  --   --   --  15  --   ALKPHOS 51  --   --   --  42  --    BILITOT 0.6  --   --   --  0.3  --  ALBUMIN 1.9*  --   --   --  1.7*  --   PROCALCITON  --   --   --  0.82  --   --   LATICACIDVEN  --  0.7  --   --   --   --   HGBA1C  --   --  5.9*  --   --   --   BNP  --   --   --  529.8*  --   --   CALCIUM 8.1*  --  7.5*  --  8.0* 8.4*    Total Time in preparing paper work, data evaluation and todays exam - 35 minutes  Signature  -    Susa Raring M.D on 08/03/2023 at 9:07 AM   -  To page go to [email or web address]

## 2023-08-03 NOTE — Progress Notes (Signed)
Patient ambulated to bathroom on room air upon oxygen saturation 87%, patient placed on 2L o2, while ambulating patient improved to 93% via [place] 2L.

## 2023-08-03 NOTE — TOC Transition Note (Signed)
Transition of Care [facility]) - Discharge Note   Patient Details  Name: Jennifer Molina MRN: [id number] Date of Birth: 1956-01-15  Transition of Care [facility]) CM/SW Contact:  Gordy Clement, RN Phone Number: 08/03/2023, 9:13 AM   Clinical Narrative:     Patient will DC to home today.  Patient will need O2 on discharge-[employer] to provide. Family will transport home. No additional TOC needs identified . AVS updated             Patient Goals and CMS Choice            Discharge Placement                       Discharge Plan and Services Additional resources added to the After Visit Summary for                                       Social Drivers of Health (SDOH) Interventions SDOH Screenings   Food Insecurity: No Food Insecurity (07/30/2023)  Housing: Low Risk  (07/30/2023)  Transportation Needs: No Transportation Needs (07/30/2023)  Utilities: Not At Risk (07/30/2023)  Depression (PHQ2-9): Low Risk  (05/02/2022)  Social Connections: Unknown (12/31/2021)   Received from [facility], [facility]  Tobacco Use: Low Risk  (07/30/2023)     Readmission Risk Interventions     No data to display

## 2023-08-03 NOTE — Discharge Instructions (Signed)
Follow with Primary MD Rema Fendt, NP in 7 days   Get CBC, CMP, Magnesium, 2 view Chest X ray -  checked next visit with your primary MD   Activity: As tolerated with Full fall precautions use walker/cane & assistance as needed  Disposition Home    Diet: Heart Healthy Low Carb, CBGs q. ACH S.   Special Instructions: If you have smoked or chewed Tobacco  in the last 2 yrs please stop smoking, stop any regular Alcohol  and or any Recreational drug use.  On your next visit with your primary care physician please Get Medicines reviewed and adjusted.  Please request your Prim.MD to go over all Hospital Tests and Procedure/Radiological results at the follow up, please get all Hospital records sent to your Prim MD by signing hospital release before you go home.  If you experience worsening of your admission symptoms, develop shortness of breath, life threatening emergency, suicidal or homicidal thoughts you must seek medical attention immediately by calling 911 or calling your MD immediately  if symptoms less severe.  You Must read complete instructions/literature along with all the possible adverse reactions/side effects for all the Medicines you take and that have been prescribed to you. Take any new Medicines after you have completely understood and accpet all the possible adverse reactions/side effects.   Do not drive when taking Pain medications.  Do not take more than prescribed Pain, Sleep and Anxiety Medications

## 2023-08-03 NOTE — Plan of Care (Signed)

## 2023-08-04 ENCOUNTER — Telehealth: Payer: Self-pay

## 2023-08-04 LAB — CULTURE, BLOOD (ROUTINE X 2)
Culture: NO GROWTH
Culture: NO GROWTH

## 2023-08-04 NOTE — Transitions of Care (Post Inpatient/ED Visit) (Signed)
   08/04/2023  Name: Jennifer Molina MRN: [id number] DOB: 01-31-1956  Today's TOC FU Call Status: Today's TOC FU Call Status:: Unsuccessful Call (1st Attempt) Unsuccessful Call (1st Attempt) Date: 08/04/23  Attempted to reach the patient regarding the most recent Inpatient/ED visit.  Follow Up Plan: Additional outreach attempts will be made to reach the patient to complete the Transitions of Care (Post Inpatient/ED visit) call.   Alyse Low, RN, BA, [facility], CRRN [facility] Coordinator, Transition of Care Ph # [phone]

## 2023-08-05 ENCOUNTER — Encounter (HOSPITAL_COMMUNITY): Payer: Self-pay | Admitting: Emergency Medicine

## 2023-08-05 ENCOUNTER — Other Ambulatory Visit: Payer: Self-pay

## 2023-08-05 ENCOUNTER — Emergency Department (HOSPITAL_COMMUNITY): Payer: Medicaid Other

## 2023-08-05 ENCOUNTER — Emergency Department (HOSPITAL_COMMUNITY)
Admission: EM | Admit: 2023-08-05 | Discharge: 2023-08-05 | Disposition: A | Discharge status: Home / Self Care | Payer: Medicaid Other | Attending: Emergency Medicine | Admitting: Emergency Medicine

## 2023-08-05 ENCOUNTER — Ambulatory Visit: Payer: Medicaid Other | Admitting: Family

## 2023-08-05 ENCOUNTER — Telehealth: Payer: Self-pay

## 2023-08-05 VITALS — BP 186/74 | HR 102 | Temp 97.6°F | Ht 63.0 in | Wt 150.6 lb

## 2023-08-05 DIAGNOSIS — R519 Headache, unspecified: Secondary | ICD-10-CM | POA: Insufficient documentation

## 2023-08-05 DIAGNOSIS — R197 Diarrhea, unspecified: Secondary | ICD-10-CM | POA: Diagnosis not present

## 2023-08-05 DIAGNOSIS — R109 Unspecified abdominal pain: Secondary | ICD-10-CM | POA: Insufficient documentation

## 2023-08-05 DIAGNOSIS — Z7982 Long term (current) use of aspirin: Secondary | ICD-10-CM | POA: Diagnosis not present

## 2023-08-05 DIAGNOSIS — I129 Hypertensive chronic kidney disease with stage 1 through stage 4 chronic kidney disease, or unspecified chronic kidney disease: Secondary | ICD-10-CM | POA: Insufficient documentation

## 2023-08-05 DIAGNOSIS — Z79899 Other long term (current) drug therapy: Secondary | ICD-10-CM | POA: Diagnosis not present

## 2023-08-05 DIAGNOSIS — Z758 Other problems related to medical facilities and other health care: Secondary | ICD-10-CM

## 2023-08-05 DIAGNOSIS — Z7984 Long term (current) use of oral hypoglycemic drugs: Secondary | ICD-10-CM | POA: Diagnosis not present

## 2023-08-05 DIAGNOSIS — Z603 Acculturation difficulty: Secondary | ICD-10-CM

## 2023-08-05 DIAGNOSIS — R030 Elevated blood-pressure reading, without diagnosis of hypertension: Secondary | ICD-10-CM

## 2023-08-05 DIAGNOSIS — N189 Chronic kidney disease, unspecified: Secondary | ICD-10-CM | POA: Insufficient documentation

## 2023-08-05 DIAGNOSIS — E1122 Type 2 diabetes mellitus with diabetic chronic kidney disease: Secondary | ICD-10-CM | POA: Insufficient documentation

## 2023-08-05 LAB — CBC
HCT: 30.3 % — ABNORMAL LOW (ref 36.0–46.0)
Hemoglobin: 9.8 g/dL — ABNORMAL LOW (ref 12.0–15.0)
MCH: 25.7 pg — ABNORMAL LOW (ref 26.0–34.0)
MCHC: 32.3 g/dL (ref 30.0–36.0)
MCV: 79.5 fL — ABNORMAL LOW (ref 80.0–100.0)
Platelets: 453 10*3/uL — ABNORMAL HIGH (ref 150–400)
RBC: 3.81 MIL/uL — ABNORMAL LOW (ref 3.87–5.11)
RDW: 17.2 % — ABNORMAL HIGH (ref 11.5–15.5)
WBC: 7.1 10*3/uL (ref 4.0–10.5)
nRBC: 0 % (ref 0.0–0.2)

## 2023-08-05 LAB — COMPREHENSIVE METABOLIC PANEL
ALT: 25 U/L (ref 0–44)
AST: 35 U/L (ref 15–41)
Albumin: 2.4 g/dL — ABNORMAL LOW (ref 3.5–5.0)
Alkaline Phosphatase: 34 U/L — ABNORMAL LOW (ref 38–126)
Anion gap: 11 (ref 5–15)
BUN: 16 mg/dL (ref 8–23)
CO2: 19 mmol/L — ABNORMAL LOW (ref 22–32)
Calcium: 8.8 mg/dL — ABNORMAL LOW (ref 8.9–10.3)
Chloride: 105 mmol/L (ref 98–111)
Creatinine, Ser: 1.68 mg/dL — ABNORMAL HIGH (ref 0.44–1.00)
GFR, Estimated: 33 mL/min — ABNORMAL LOW (ref >60)
Glucose, Bld: 136 mg/dL — ABNORMAL HIGH (ref 70–99)
Potassium: 3.8 mmol/L (ref 3.5–5.1)
Sodium: 135 mmol/L (ref 135–145)
Total Bilirubin: 0.8 mg/dL (ref <1.2)
Total Protein: 6.6 g/dL (ref 6.5–8.1)

## 2023-08-05 LAB — CBG MONITORING, ED: Glucose-Capillary: 121 mg/dL — ABNORMAL HIGH (ref 70–99)

## 2023-08-05 LAB — TYPE AND SCREEN
ABO/RH(D): AB POS
Antibody Screen: NEGATIVE

## 2023-08-05 LAB — POC OCCULT BLOOD, ED: Fecal Occult Bld: NEGATIVE

## 2023-08-05 LAB — LIPASE, BLOOD: Lipase: 43 U/L (ref 11–51)

## 2023-08-05 MED ORDER — HYDRALAZINE HCL 25 MG PO TABS
50.0000 mg | ORAL_TABLET | Freq: Once | ORAL | Status: AC
  Administered 2023-08-05: 50 mg via ORAL
  Filled 2023-08-05: qty 2

## 2023-08-05 MED ORDER — ONDANSETRON HCL 4 MG/2ML IJ SOLN
4.0000 mg | Freq: Once | INTRAMUSCULAR | Status: AC
  Administered 2023-08-05: 4 mg via INTRAVENOUS
  Filled 2023-08-05: qty 2

## 2023-08-05 MED ORDER — MORPHINE SULFATE (PF) 4 MG/ML IV SOLN
4.0000 mg | Freq: Once | INTRAVENOUS | Status: AC
Start: 2023-08-05 — End: 2023-08-05
  Administered 2023-08-05: 4 mg via INTRAVENOUS
  Filled 2023-08-05: qty 1

## 2023-08-05 MED ORDER — LABETALOL HCL 5 MG/ML IV SOLN
10.0000 mg | Freq: Once | INTRAVENOUS | Status: AC
  Administered 2023-08-05: 10 mg via INTRAVENOUS
  Filled 2023-08-05: qty 4

## 2023-08-05 MED ORDER — IOHEXOL 350 MG/ML SOLN
60.0000 mL | Freq: Once | INTRAVENOUS | Status: AC | PRN
  Administered 2023-08-05: 60 mL via INTRAVENOUS

## 2023-08-05 MED ORDER — AMLODIPINE BESYLATE 5 MG PO TABS
10.0000 mg | ORAL_TABLET | Freq: Once | ORAL | Status: AC
  Administered 2023-08-05: 10 mg via ORAL
  Filled 2023-08-05: qty 2

## 2023-08-05 NOTE — Discharge Instructions (Signed)
You are given your home blood pressure medications here in the emergency department make sure to restart them as previously prescribed by the hospital discharge  May take Imodium as needed for diarrhea  Keep close follow-up with a primary care doctor  Continue using the oxygen which she was prescribed when she was discharged  Return for any new or worsening symptoms

## 2023-08-05 NOTE — ED Provider Notes (Signed)
[place] EMERGENCY DEPARTMENT AT [facility] Provider Note   CSN: [id number] Arrival date & time: 08/05/23  1637    History  Chief Complaint  Patient presents with   Headache   Melena    Jennifer Molina is a 67 y.o. female history of hypertension, diabetes, MGUS, CKD, recent hospital discharge after multifocal pneumonia, rhinovirus here for evaluation of headache and abdominal pain.  Patient states she developed headache earlier today.  No numbness, weakness.  She is also noted over the last few days her stools have been darker than normal she denies any Pepto-Bismol use. No epigastric pain, hx of GI bleed, chronic NSAID use. She forgot to take her blood pressure medication today and yesterday.  She denies any blood thinners.  She has some diffuse left mid abdominal pain.  No numbness, weakness, vision changes, difficulty with word finding, chest pain, shortness of breath.  She still has cough from her prior hospitalization.  She was supposed to be wearing 1 to 2 L via nasal cannula at baseline however not wearing here in ED.  She recently completed her antibiotics for her pneumonia.  She denies bright blood per rectum. 2-3 episode of loose stool. No dysuria or hematuria.  No recent falls or injuries.  HPI     Home Medications Prior to Admission medications   Medication Sig Start Date End Date Taking? Authorizing Provider  amLODipine (NORVASC) 10 MG tablet Take 1 tablet (10 mg total) by mouth daily. 08/04/23   Leroy Sea, MD  ASPIRIN LOW DOSE 81 MG tablet TAKE 1 TABLET(81 MG) BY MOUTH DAILY Patient taking differently: Take 81 mg by mouth daily. 03/09/23   Johney Maine, MD  atorvastatin (LIPITOR) 40 MG tablet Take 1 tablet by mouth once daily 09/16/22   Rema Fendt, NP  azithromycin (ZITHROMAX) 500 MG tablet Take 1 tablet (500 mg total) by mouth daily. 08/03/23   Leroy Sea, MD  Blood Glucose Monitoring Suppl (ACCU-CHEK GUIDE ME) w/Device KIT Use as  instructed to check blood sugars 09/25/22   Reather Littler, MD  Blood Glucose Monitoring Suppl (TRUE METRIX METER) w/Device KIT Use as directed 07/23/21   Rema Fendt, NP  carvedilol (COREG) 12.5 MG tablet Take 1 tablet (12.5 mg total) by mouth 2 (two) times daily. 08/03/23   Leroy Sea, MD  dapagliflozin propanediol (FARXIGA) 10 MG TABS tablet TAKE 1 TABLET(10 MG) BY MOUTH DAILY BEFORE BREAKFAST 10/14/22   Reather Littler, MD  gabapentin (NEURONTIN) 300 MG capsule TAKE 1 CAPSULE(300 MG) BY MOUTH AT BEDTIME Patient taking differently: Take 300 mg by mouth at bedtime. 04/07/23   Johney Maine, MD  glucose blood (ACCU-CHEK GUIDE) test strip 1 each by Other route daily. And lancets 1/day 09/25/22   Reather Littler, MD  glucose blood (TRUE METRIX BLOOD GLUCOSE TEST) test strip Use as instructed 07/23/21   Rema Fendt, NP  hydrALAZINE (APRESOLINE) 100 MG tablet Take 1 tablet (100 mg total) by mouth every 8 (eight) hours. 08/03/23   Leroy Sea, MD  metFORMIN (GLUCOPHAGE-XR) 500 MG 24 hr tablet Take 2 tablets (1,000 mg total) by mouth daily. 12/16/22   Reather Littler, MD  TRUEplus Lancets 28G MISC Use as directed 07/23/21   Rema Fendt, NP      Allergies    Patient has no known allergies.    Review of Systems   Review of Systems  Constitutional: Negative.   HENT: Negative.    Respiratory: Negative.  Cardiovascular: Negative.   Gastrointestinal:  Positive for abdominal pain and diarrhea. Negative for abdominal distention, anal bleeding, blood in stool, constipation, nausea, rectal pain and vomiting.       Dark stool  Musculoskeletal: Negative.   Skin: Negative.   Neurological:  Positive for headaches. Negative for dizziness, tremors, seizures, syncope, facial asymmetry, speech difficulty, weakness, light-headedness and numbness.  All other systems reviewed and are negative.   Physical Exam Updated Vital Signs BP (!) 178/89   Pulse 93   Temp 98 F (36.7 C) (Oral)   Resp 20    LMP  (LMP Unknown)   SpO2 (!) 85%  Physical Exam Vitals and nursing note reviewed. Exam conducted with a chaperone present.  Constitutional:      General: She is not in acute distress.    Appearance: She is well-developed. She is not ill-appearing, toxic-appearing or diaphoretic.  HENT:     Head: Normocephalic and atraumatic.     Nose: Nose normal.     Mouth/Throat:     Mouth: Mucous membranes are moist.  Eyes:     Pupils: Pupils are equal, round, and reactive to light.  Cardiovascular:     Rate and Rhythm: Normal rate.     Pulses: Normal pulses.     Heart sounds: Normal heart sounds.  Pulmonary:     Effort: Pulmonary effort is normal. No respiratory distress.     Breath sounds: Normal breath sounds.     Comments: Clear bilaterally, speaks in full sentences without difficulty Abdominal:     General: Bowel sounds are normal. There is no distension.     Palpations: Abdomen is soft.     Tenderness: There is abdominal tenderness. There is no right CVA tenderness, left CVA tenderness, guarding or rebound. Negative signs include Murphy's sign and McBurney's sign.     Comments: Diffuse tenderness to left mid abdomen.  No rebound or guarding, negative CVA tap bilaterally  Genitourinary:    Rectum: Guaiac result negative. No mass, tenderness, anal fissure, external hemorrhoid or internal hemorrhoid. Normal anal tone.     Comments: Light brown stool in rectal vault Musculoskeletal:        General: Normal range of motion.     Cervical back: Normal range of motion.  Skin:    General: Skin is warm and dry.  Neurological:     General: No focal deficit present.     Mental Status: She is alert.     Cranial Nerves: Cranial nerves 2-12 are intact.     Sensory: Sensation is intact.     Motor: Motor function is intact.     Coordination: Coordination is intact.     Gait: Gait is intact.  Psychiatric:        Mood and Affect: Mood normal.     ED Results / Procedures / Treatments    Labs (all labs ordered are listed, but only abnormal results are displayed) Labs Reviewed  COMPREHENSIVE METABOLIC PANEL - Abnormal; Notable for the following components:      Result Value   CO2 19 (*)    Glucose, Bld 136 (*)    Creatinine, Ser 1.68 (*)    Calcium 8.8 (*)    Albumin 2.4 (*)    Alkaline Phosphatase 34 (*)    GFR, Estimated 33 (*)    All other components within normal limits  CBC - Abnormal; Notable for the following components:   RBC 3.81 (*)    Hemoglobin 9.8 (*)    HCT 30.3 (*)  MCV 79.5 (*)    MCH 25.7 (*)    RDW 17.2 (*)    Platelets 453 (*)    All other components within normal limits  CBG MONITORING, ED - Abnormal; Notable for the following components:   Glucose-Capillary 121 (*)    All other components within normal limits  LIPASE, BLOOD  POC OCCULT BLOOD, ED  TYPE AND SCREEN    EKG None  Radiology CT Head Wo Contrast Result Date: 08/05/2023 CLINICAL DATA:  New onset headache EXAM: CT HEAD WITHOUT CONTRAST TECHNIQUE: Contiguous axial images were obtained from the base of the skull through the vertex without intravenous contrast. RADIATION DOSE REDUCTION: This exam was performed according to the departmental dose-optimization program which includes automated exposure control, adjustment of the mA and/or kV according to patient size and/or use of iterative reconstruction technique. COMPARISON:  None Available. FINDINGS: Brain: No mass,hemorrhage or extra-axial collection. Normal appearance of the parenchyma and CSF spaces. Vascular: No hyperdense vessel or unexpected vascular calcification. Skull: The visualized skull base, calvarium and extracranial soft tissues are normal. Sinuses/Orbits: There is complete opacification of the right maxillary and left sphenoid sinuses. Partial opacification of the left frontal sinus. IMPRESSION: 1. No acute intracranial abnormality. 2. Complete opacification of the right maxillary and left sphenoid sinuses. Partial  opacification of the left frontal sinus. Electronically Signed   By: Deatra Robinson M.D.   On: 08/05/2023 21:54   CLINICAL DATA: Acute abdominal pain. History of multiple myeloma.  EXAM: CT ABDOMEN AND PELVIS WITH CONTRAST  TECHNIQUE: Multidetector CT imaging of the abdomen and pelvis was performed using the standard protocol following bolus administration of intravenous contrast.  RADIATION DOSE REDUCTION: This exam was performed according to the departmental dose-optimization program which includes automated exposure control, adjustment of the mA and/or kV according to patient size and/or use of iterative reconstruction technique.  CONTRAST: 60mL OMNIPAQUE IOHEXOL 350 MG/ML SOLN  COMPARISON: CT chest abdomen and pelvis 07/30/2023  FINDINGS: Lower chest: There is patchy airspace disease in the bilateral lower lobes which has slightly progressed in the right lower lobe. There are small bilateral pleural effusions, left greater than right, which have also increased. There is a stable small pericardial effusion.  Hepatobiliary: No focal liver abnormality is seen. No gallstones, gallbladder wall thickening, or biliary dilatation.  Pancreas: Unremarkable. No pancreatic ductal dilatation or surrounding inflammatory changes.  Spleen: Normal in size without focal abnormality.  Adrenals/Urinary Tract: Adrenal glands are unremarkable. Kidneys are normal, without renal calculi, focal lesion, or hydronephrosis. Bladder is unremarkable.  Stomach/Bowel: Stomach is within normal limits. Appendix appears normal. No evidence of bowel wall thickening, distention, or inflammatory changes. There are air-fluid levels throughout the colon.  Vascular/Lymphatic: Aortic atherosclerosis. No enlarged abdominal or pelvic lymph nodes.  Reproductive: Uterus and bilateral adnexa are unremarkable.  Other: There is trace free fluid in the pelvis, unchanged. There is diffuse body wall edema which  has increased. There is no abdominal wall hernia.  Musculoskeletal: Multilevel degenerative changes affect the spine.  IMPRESSION: 1. Air-fluid levels throughout the colon compatible with diarrheal illness. 2. Increasing small bilateral pleural effusions. Grossly unchanged bibasilar airspace disease worrisome for infection. 3. Stable small pericardial effusion. 4. Increasing body wall edema.  Aortic Atherosclerosis (ICD10-I70.0).   Electronically Signed By: Darliss Cheney M.D. On: 08/05/2023 22:04  Procedures Procedures    Medications Ordered in ED Medications  morphine (PF) 4 MG/ML injection 4 mg (4 mg Intravenous Given 08/05/23 2110)  ondansetron (ZOFRAN) injection 4 mg (4 mg Intravenous Given  08/05/23 2108)  iohexol (OMNIPAQUE) 350 MG/ML injection 60 mL (60 mLs Intravenous Contrast Given 08/05/23 2055)  labetalol (NORMODYNE) injection 10 mg (10 mg Intravenous Given 08/05/23 2231)  amLODipine (NORVASC) tablet 10 mg (10 mg Oral Given 08/05/23 2322)  hydrALAZINE (APRESOLINE) tablet 50 mg (50 mg Oral Given 08/05/23 2322)    ED Course/ Medical Decision Making/ A&P Clinical Course as of 08/05/23 2329  Wed Aug 05, 2023  2252 Reassessed. HA improved. Does not want any other meds at this time. Discussed workup so far. States she has not taken her home BP meds in 2 days [BH]    Clinical Course User Index [BH] Erby Sanderson A, PA-C  67 year old multiple medical problems recent discharge from multifocal pneumonia here for evaluation of headache, abdominal pain and possible melena.  No sudden onset thunderclap headache, she is nonfocal neuroexam without deficits.  Recently discharged for rhinovirus and multifocal pneumonia, she still has cough.  Denies any chest pain or shortness of breath.  She is supposed to be wearing oxygen which she was discharged home on however she is not wearing any currently here.  Her heart and lungs are clear.  Her abdomen is diffusely tender on the left.   She denies any prior history of GI bleeds, no anticoagulation, chronic NSAID use, recent trauma or injury. 2-3 episode of loose stool since dc.  Plan on labs imaging and reassess she is hypertensive however states she has not taken her BP meds.  Labs and imaging personally viewed and interpreted:  CBC without leukocytosis, hemoglobin 9.8-- 1.5 higher than her CBC 3 days ago Metabolic panel creatinine 1.68, back to baseline, BUN 16 Lipase 43 Occult negative--light brown stool in rectal vault CT head with opacification of sinus CT abdomen pelvis consistent with diarrheal illness.  Small pleural effusions, unchanged airspace disease increased body wall edema  I suspect her edema likely due to IV fluids given to her during her hospitalization.  She does not appear grossly fluid overloaded on exam.  Breathing is comfortable here.  Will have her follow-up outpatient, return for any worsening symptoms.  Low suspicion for sepsis, GI bleed, bacterial infectious process, dissection, CVA, head bleed, meningitis, acute angle glaucoma, IIH, hypertensive emergency.  She was given labetalol here with downtrend of her blood pressures she was also given her home BP meds of hydralazine and amlodipine will have her follow-up outpatient, return for any worsening symptoms.  The patient has been appropriately medically screened and/or stabilized in the ED. I have low suspicion for any other emergent medical condition which would require further screening, evaluation or treatment in the ED or require inpatient management.  Patient is hemodynamically stable and in no acute distress.  Patient able to ambulate in department prior to ED.  Evaluation does not show acute pathology that would require ongoing or additional emergent interventions while in the emergency department or further inpatient treatment.  I have discussed the diagnosis with the patient and answered all questions.  Pain is been managed while in the  emergency department and patient has no further complaints prior to discharge.  Patient is comfortable with plan discussed in room and is stable for discharge at this time.  I have discussed strict return precautions for returning to the emergency department.  Patient was encouraged to follow-up with PCP/specialist refer to at discharge.  Medical Decision Making Amount and/or Complexity of Data Reviewed Independent Historian:     Details: Daughters in room External Data Reviewed: labs, radiology, ECG and notes. Labs: ordered. Decision-making details documented in ED Course. Radiology: ordered and independent interpretation performed. Decision-making details documented in ED Course.  Risk OTC drugs. Prescription drug management. Parenteral controlled substances. Decision regarding hospitalization. Diagnosis or treatment significantly limited by social determinants of health.          Final Clinical Impression(s) / ED Diagnoses Final diagnoses:  Diarrhea, unspecified type  Acute nonintractable headache, unspecified headache type  Elevated blood pressure reading    Rx / DC Orders ED Discharge Orders     None         Terrie Grajales A, PA-C 08/05/23 2329    Loetta Rough, MD 08/06/23 [phone]

## 2023-08-05 NOTE — Progress Notes (Signed)
Patient ID: Jennifer Molina, female    DOB: [birth year]  MRN: [id number]  CC: Headaches   Subjective: Jennifer Molina is a 67 y.o. female who presents for headaches. She is accompanied by her daughter.   Her concerns today include:  Daughter reports patient recently discharged from hospital due to pneumonia. Daughter states patient has a headache with dizziness and stomach pain with "black stools" which began hours ago. Denies red flag symptoms. Patient established with Cardiology, Endocrinology, and Oncology. No further issues/concerns for discussion today.    Patient Active Problem List   Diagnosis Date Noted   Sepsis (HCC) due to multifocal pneumonia 07/30/2023   Acute renal failure superimposed on stage 3b chronic kidney disease (HCC) 07/30/2023   Hyponatremia 07/30/2023   MGUS (monoclonal gammopathy of unknown significance) 07/30/2023   Anemia in chronic kidney disease (CKD) 07/30/2023   Myocardial injury 07/30/2023   Proliferative diabetic retinopathy with macular edema associated with type 2 diabetes mellitus (HCC) 05/20/2022   Hyperlipidemia 06/21/2021   Acute respiratory failure with hypoxia (HCC) 07/23/2019   Sepsis due to COVID-19 [facility]) 07/22/2019   Pneumonia due to COVID-19 virus 07/22/2019   Dementia (HCC) 03/28/2019   Chronic post-traumatic stress disorder (PTSD) 03/28/2019   Uncontrolled type 2 diabetes mellitus with hyperglycemia, without long-term current use of insulin (HCC) 2015   Hypertension 2015     Current Outpatient Medications on File Prior to Visit  Medication Sig Dispense Refill   amLODipine (NORVASC) 10 MG tablet Take 1 tablet (10 mg total) by mouth daily. 30 tablet 0   ASPIRIN LOW DOSE 81 MG tablet TAKE 1 TABLET(81 MG) BY MOUTH DAILY (Patient taking differently: Take 81 mg by mouth daily.) 30 tablet 2   atorvastatin (LIPITOR) 40 MG tablet Take 1 tablet by mouth once daily 30 tablet 0   azithromycin (ZITHROMAX) 500 MG tablet Take 1 tablet (500 mg total) by mouth  daily. 2 tablet 0   Blood Glucose Monitoring Suppl (ACCU-CHEK GUIDE ME) w/Device KIT Use as instructed to check blood sugars 1 kit 0   Blood Glucose Monitoring Suppl (TRUE METRIX METER) w/Device KIT Use as directed 1 kit 0   carvedilol (COREG) 12.5 MG tablet Take 1 tablet (12.5 mg total) by mouth 2 (two) times daily. 60 tablet 0   dapagliflozin propanediol (FARXIGA) 10 MG TABS tablet TAKE 1 TABLET(10 MG) BY MOUTH DAILY BEFORE BREAKFAST 90 tablet 3   gabapentin (NEURONTIN) 300 MG capsule TAKE 1 CAPSULE(300 MG) BY MOUTH AT BEDTIME (Patient taking differently: Take 300 mg by mouth at bedtime.) 30 capsule 5   glucose blood (ACCU-CHEK GUIDE) test strip 1 each by Other route daily. And lancets 1/day 100 each 3   glucose blood (TRUE METRIX BLOOD GLUCOSE TEST) test strip Use as instructed 100 each 12   hydrALAZINE (APRESOLINE) 100 MG tablet Take 1 tablet (100 mg total) by mouth every 8 (eight) hours. 90 tablet 0   metFORMIN (GLUCOPHAGE-XR) 500 MG 24 hr tablet Take 2 tablets (1,000 mg total) by mouth daily. 180 tablet 3   TRUEplus Lancets 28G MISC Use as directed 100 each 4   No current facility-administered medications on file prior to visit.    No Known Allergies  Social History   Socioeconomic History   Marital status: Married    Spouse name: Vear Clock   Number of children: 7   Years of education: 3   Highest education level: 3rd grade  Occupational History   Occupation: Retired from farming in [place]  Tobacco  Use   Smoking status: Never   Smokeless tobacco: Never  Vaping Use   Vaping status: Never Used  Substance and Sexual Activity   Alcohol use: Never   Drug use: Never   Sexual activity: Not on file  Other Topics Concern   Not on file  Social History Narrative   [place]   Lives at home with husband   Her 2 daughters, 2 sons and their families   All together, 11 people in the home   Social Drivers of Health   Financial Resource Strain: Not on file  Food  Insecurity: No Food Insecurity (07/30/2023)   Hunger Vital Sign    Worried About Running Out of Food in the Last Year: Never true    Ran Out of Food in the Last Year: Never true  Transportation Needs: No Transportation Needs (07/30/2023)   PRAPARE - Administrator, Civil Service (Medical): No    Lack of Transportation (Non-Medical): No  Physical Activity: Not on file  Stress: Not on file  Social Connections: Unknown (12/31/2021)   Received from [facility]   Social Network    Social Network: Not on file  Intimate Partner Violence: Not At Risk (07/30/2023)   Humiliation, Afraid, Rape, and Kick questionnaire    Fear of Current or Ex-Partner: No    Emotionally Abused: No    Physically Abused: No    Sexually Abused: No    Family History  Problem Relation Age of Onset   Diabetes Mother    Diabetes Mellitus II Neg Hx     No past surgical history on file.  ROS: Review of Systems Negative except as stated above  PHYSICAL EXAM: BP (!) 186/74   Pulse (!) 102   Temp 97.6 F (36.4 C) (Oral)   Ht 5\' 3"  (1.6 m)   Wt 150 lb 9.6 oz (68.3 kg)   LMP  (LMP Unknown)   SpO2 93%   BMI 26.68 kg/m   Physical Exam HENT:     Head: Normocephalic and atraumatic.     Nose: Nose normal.     Mouth/Throat:     Mouth: Mucous membranes are moist.     Pharynx: Oropharynx is clear.  Eyes:     Extraocular Movements: Extraocular movements intact.     Conjunctiva/sclera: Conjunctivae normal.     Pupils: Pupils are equal, round, and reactive to light.  Cardiovascular:     Rate and Rhythm: Tachycardia present.     Pulses: Normal pulses.     Heart sounds: Normal heart sounds.  Pulmonary:     Effort: Pulmonary effort is normal.     Breath sounds: Normal breath sounds.  Abdominal:     General: Bowel sounds are normal.     Palpations: Abdomen is soft.  Musculoskeletal:        General: Normal range of motion.     Cervical back: Normal range of motion and neck supple.   Neurological:     General: No focal deficit present.     Mental Status: She is alert and oriented to person, place, and time.  Psychiatric:        Mood and Affect: Mood normal.        Behavior: Behavior normal.     ASSESSMENT AND PLAN: Note - Supervising physician Georganna Skeans, MD present during office visit and had a detailed discussion with patient/patient's daughter.   1. Nonintractable headache, unspecified chronicity pattern, unspecified headache type (Primary) 2.  Abdominal pain, unspecified abdominal location - Patient's daughter agrees to take patient immediately to Emergency Department for evaluation/management.  3. Language barrier - Daughter serves as interpreter and part-historian.   Patient was given the opportunity to ask questions.  Patient verbalized understanding of the plan and was able to repeat key elements of the plan. Patient was given clear instructions to go to Emergency Department or return to medical center if symptoms don't improve, worsen, or new problems develop.The patient verbalized understanding.  Follow-up with primary provider as scheduled.   Rema Fendt, NP

## 2023-08-05 NOTE — Progress Notes (Signed)
This is a hospital f/u.  Patient states headache that started a couple hours ago. States stomach problems.  Patient states her stool turns black when she uses restroom.

## 2023-08-05 NOTE — ED Provider Triage Note (Signed)
Emergency Medicine Provider Triage Evaluation Note  Jennifer Molina , a 67 y.o. female  was evaluated in triage.  Pt complains of severe headache and abdominal pain.  Patient was recently discharged from the hospital after staying for multifocal pneumonia and acute hypoxic respiratory failure.  She reports associated melena and nausea.  Denies fever or chills.  Review of Systems  Positive:  Negative: See above   Physical Exam  BP (!) 183/81   Pulse (!) 103   Temp 98.2 F (36.8 C) (Oral)   Resp (!) 21   LMP  (LMP Unknown)   SpO2 97%  Gen:   Awake, no distress   Resp:  Normal effort  MSK:   Moves extremities without difficulty  Other:  Diffuse abdominal tenderness  Medical Decision Making  Medically screening exam initiated at 5:16 PM.  Appropriate orders placed.  Grady Villada was informed that the remainder of the evaluation will be completed by another provider, this initial triage assessment does not replace that evaluation, and the importance of remaining in the ED until their evaluation is complete.     Honor Loh [place], [place] 08/05/23 [phone]

## 2023-08-05 NOTE — Transitions of Care (Post Inpatient/ED Visit) (Signed)
   08/05/2023  Name: Jennifer Molina MRN: [id number] DOB: 02/15/1956  Today's TOC FU Call Status: Today's TOC FU Call Status:: Unsuccessful Call (2nd Attempt) Unsuccessful Call (2nd Attempt) Date: 08/05/23  Attempted to reach the patient regarding the most recent Inpatient/ED visit.  Follow Up Plan: Additional outreach attempts will be made to reach the patient to complete the Transitions of Care (Post Inpatient/ED visit) call.   Alyse Low, RN, BA, [facility], CRRN [facility] Coordinator, Transition of Care Ph # [phone]

## 2023-08-05 NOTE — ED Triage Notes (Signed)
Pt reports black stools x 2 days.  Increased shob.  Has as needed O2 at home and is now using the oxygen at 1 Liter.  Pt has abdominal pain and headache with some dizziness.  No recent falls.  Does not recall taking any blood thinners.

## 2023-08-07 ENCOUNTER — Telehealth: Payer: Self-pay

## 2023-08-07 NOTE — Transitions of Care (Post Inpatient/ED Visit) (Signed)
   08/07/2023  Name: Brindy Izbicki MRN: [id number] DOB: 1956-05-18  Today's TOC FU Call Status: Today's TOC FU Call Status:: Unsuccessful Call (3rd Attempt) Unsuccessful Call (3rd Attempt) Date: 08/07/23  Attempted to reach the patient regarding the most recent Inpatient/ED visit.  Follow Up Plan: No further outreach attempts will be made at this time. We have been unable to contact the patient.  Alyse Low, RN, BA, [facility], CRRN [facility] Coordinator, Transition of Care Ph # [phone]

## 2023-08-10 ENCOUNTER — Other Ambulatory Visit: Payer: Self-pay

## 2023-08-10 DIAGNOSIS — C9000 Multiple myeloma not having achieved remission: Secondary | ICD-10-CM

## 2023-08-11 ENCOUNTER — Inpatient Hospital Stay: Payer: Medicaid Other | Attending: Family

## 2023-08-11 ENCOUNTER — Inpatient Hospital Stay: Payer: Medicaid Other | Admitting: Hematology

## 2023-09-16 ENCOUNTER — Other Ambulatory Visit: Payer: Self-pay

## 2023-09-16 DIAGNOSIS — E1165 Type 2 diabetes mellitus with hyperglycemia: Secondary | ICD-10-CM

## 2023-09-16 MED ORDER — GLUCOSE BLOOD VI STRP: ORAL_STRIP | 12 refills | Status: AC

## 2023-09-17 ENCOUNTER — Other Ambulatory Visit: Payer: Self-pay

## 2023-09-17 DIAGNOSIS — E1165 Type 2 diabetes mellitus with hyperglycemia: Secondary | ICD-10-CM

## 2023-09-17 MED ORDER — GLUCOSE BLOOD VI STRP: ORAL_STRIP | 12 refills | Status: AC

## 2023-11-03 ENCOUNTER — Other Ambulatory Visit: Payer: Self-pay | Admitting: Internal Medicine

## 2023-12-07 ENCOUNTER — Other Ambulatory Visit: Payer: Self-pay | Admitting: Hematology

## 2024-01-20 NOTE — Progress Notes (Incomplete)
 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   Patient was last seen by me on 04/13/2023 and complained of bilateral leg swelling, fatigue, occasional nose bleeds, and occasional memory issues.   -Discussed lab results on 01/22/2024 in detail with patient. CBC showed WBC of ***K, hemoglobin of ***, and platelets of ***K. -    MEDICAL HISTORY:  Past Medical History:  Diagnosis Date   Chronic kidney disease IIIb    Dementia (HCC) 03/28/2019   DM (diabetes mellitus), type 2, uncontrolled 2015   Has poor vision.  Family not aware of A1C or what sugars run.  Believe not well controlled.  Not able to monitor due to cost of monitoring equipment   Hypertension 2015    SURGICAL HISTORY: No past surgical history on file.  SOCIAL HISTORY: Social History   Socioeconomic History   Marital status: Married    Spouse name: Erin Havers   Number of children: 7   Years of education: 3   Highest education level: 3rd grade  Occupational History   Occupation: Retired from farming in [place]  Tobacco Use   Smoking status: Never   Smokeless tobacco: Never  Vaping Use   Vaping status: Never Used  Substance and Sexual Activity   Alcohol use: Never   Drug use: Never   Sexual activity: Not on file  Other Topics Concern   Not on file  Social History Narrative    [place] in [place]   Lives at home with husband   Her 2 daughters, 2 sons and their families   All together, 11 people in the home   Social Drivers of Health   Financial Resource Strain: Not on file  Food Insecurity: No Food Insecurity (07/30/2023)   Hunger Vital Sign    Worried About Running Out of Food in the Last Year: Never true    Ran Out of Food in the Last Year: Never true  Transportation Needs: No Transportation Needs (07/30/2023)   PRAPARE - Administrator, Civil Service (Medical): No    Lack of Transportation (Non-Medical): No  Physical Activity: Not on file  Stress: Not on file  Social Connections: Unknown (12/31/2021)   [facility] from [facility], [facility]   Social Network    Social Network: Not on file  Intimate Partner Violence: Not At Risk (07/30/2023)   Humiliation, Afraid, Rape, and Kick questionnaire    Fear of Current or Ex-Partner: No    Emotionally Abused: No    Physically Abused: No    Sexually Abused: No    FAMILY HISTORY: Family History  Problem Relation Age of Onset   Diabetes Mother    Diabetes Mellitus II Neg Hx     ALLERGIES:  has no known allergies.  MEDICATIONS:  Current Outpatient Medications  Medication Sig Dispense Refill   amLODipine  (NORVASC ) 10 MG tablet Take 1 tablet (10 mg total) by mouth daily.  30 tablet 0   ASPIRIN  LOW DOSE 81 MG tablet TAKE 1 TABLET(81 MG) BY MOUTH DAILY (Patient taking differently: Take 81 mg by mouth daily.) 30 tablet 2   atorvastatin  (LIPITOR) 40 MG tablet Take 1 tablet by mouth once daily 30 tablet 0   azithromycin  (ZITHROMAX ) 500 MG tablet Take 1 tablet (500 mg total) by mouth daily. 2 tablet 0   Blood Glucose Monitoring Suppl (ACCU-CHEK GUIDE ME) w/Device KIT Use as instructed to check blood sugars 1 kit 0   Blood Glucose Monitoring Suppl (TRUE METRIX METER) w/Device KIT Use as directed 1 kit 0   carvedilol  (COREG ) 12.5 MG tablet Take 1 tablet (12.5 mg total) by mouth 2 (two) times  daily. 60 tablet 0   dapagliflozin  propanediol (FARXIGA ) 10 MG TABS tablet TAKE 1 TABLET(10 MG) BY MOUTH DAILY BEFORE BREAKFAST 90 tablet 3   gabapentin  (NEURONTIN ) 300 MG capsule TAKE 1 CAPSULE(300 MG) BY MOUTH AT BEDTIME 30 capsule 5   glucose blood (ACCU-CHEK GUIDE) test strip 1 each by Other route daily. And lancets 1/day 100 each 3   glucose blood (TRUE METRIX BLOOD GLUCOSE TEST) test strip Use as instructed 100 each 12   glucose blood test strip Use as instructed 100 each 12   glucose blood test strip Use as instructed 100 each 12   hydrALAZINE  (APRESOLINE ) 100 MG tablet Take 1 tablet (100 mg total) by mouth every 8 (eight) hours. 90 tablet 0   metFORMIN  (GLUCOPHAGE -XR) 500 MG 24 hr tablet Take 2 tablets (1,000 mg total) by mouth daily. 180 tablet 3   TRUEplus Lancets 28G MISC Use as directed 100 each 4   No current facility-administered medications for this visit.    REVIEW OF SYSTEMS:   .10 Point review of Systems was done is negative except as noted above.  PHYSICAL EXAMINATION: .LMP  (LMP Unknown)    GENERAL:alert, in no acute distress and comfortable SKIN: no acute rashes, no significant lesions EYES: conjunctiva are pink and non-injected, sclera anicteric OROPHARYNX: MMM, no exudates, no oropharyngeal erythema or ulceration NECK: supple, no JVD LYMPH:  no palpable lymphadenopathy in the cervical, axillary or inguinal regions LUNGS: clear to auscultation b/l with normal respiratory effort HEART: regular rate & rhythm ABDOMEN:  normoactive bowel sounds , non tender, not distended. Extremity: no pedal edema PSYCH: alert & oriented x 3 with fluent speech NEURO: no focal motor/sensory deficits   LABORATORY DATA:  I have reviewed the data as listed  .    Latest Ref Rng & Units 08/05/2023    4:59 PM 08/02/2023    5:31 AM 08/01/2023    3:01 PM  CBC  WBC 4.0 - 10.5 K/uL 7.1  8.9    Hemoglobin 12.0 - 15.0 g/dL 9.8  8.2  8.5   Hematocrit 36.0 - 46.0 % 30.3  24.9  25.2    Platelets 150 - 400 K/uL 453  366     CBC    Component Value Date/Time   WBC 7.1 08/05/2023 1659   RBC 3.81 (L) 08/05/2023 1659   HGB 9.8 (L) 08/05/2023 1659   HGB 8.8 (L) 04/13/2023 0950   HGB 12.2 03/28/2019 1143   HCT 30.3 (L) 08/05/2023 1659   HCT 37.4 03/28/2019 1143   PLT 453 (H) 08/05/2023 1659   PLT 179 04/13/2023 0950   PLT 295 03/28/2019 1143   MCV 79.5 (L) 08/05/2023 1659   MCV 83 03/28/2019 1143   MCH 25.7 (L) 08/05/2023 1659   MCHC 32.3 08/05/2023 1659  RDW 17.2 (H) 08/05/2023 1659   RDW 14.4 03/28/2019 1143   LYMPHSABS 1.3 04/13/2023 0950   LYMPHSABS 2.5 03/28/2019 1143   MONOABS 0.4 04/13/2023 0950   EOSABS 0.2 04/13/2023 0950   EOSABS 0.1 03/28/2019 1143   BASOSABS 0.0 04/13/2023 0950   BASOSABS 0.1 03/28/2019 1143       Latest Ref Rng & Units 08/05/2023    4:59 PM 08/02/2023    5:31 AM 08/01/2023    4:32 AM  CMP  Glucose 70 - 99 mg/dL 161  096  045   BUN 8 - 23 mg/dL 16  21  23    Creatinine 0.44 - 1.00 mg/dL 4.09  8.11  9.14   Sodium 135 - 145 mmol/L 135  137  134   Potassium 3.5 - 5.1 mmol/L 3.8  4.1  3.5   Chloride 98 - 111 mmol/L 105  108  104   CO2 22 - 32 mmol/L 19  23  24    Calcium  8.9 - 10.3 mg/dL 8.8  8.4  8.0   Total Protein 6.5 - 8.1 g/dL 6.6   5.6   Total Bilirubin <1.2 mg/dL 0.8   0.3   Alkaline Phos 38 - 126 U/L 34   42   AST 15 - 41 U/L 35   21   ALT 0 - 44 U/L 25   15    . Lab Results  Component Value Date   IRON  17 (L) 07/31/2023   TIBC 129 (L) 07/31/2023   IRONPCTSAT 13 07/31/2023   (Iron  and TIBC)  Lab Results  Component Value Date   FERRITIN 541 (H) 07/31/2023     09/24/2020 Surgical Pathology DIAGNOSIS:   BONE MARROW, ASPIRATE, CLOT, CORE:  -Slightly hypercellular bone marrow for age with plasma cell neoplasm  -See comment   PERIPHERAL BLOOD:  -Normocytic-normochromic anemia   COMMENT:   The bone marrow is hypercellular for age with slight increase in plasma  cells representing 8% of all cells in the  aspirate with lack of large  aggregates or sheets.  Immunohistochemical stains highlight the  increased plasma cell component in the clot/biopsy sections correlating  with the aspirate.  The plasma cells display kappa light chain  restriction consistent with plasma cell neoplasm.  Correlation with  cytogenetic and FISH studies recommended.   09/24/2020 Molecular Pathology    09/24/2020 Cytogenetics Report    RADIOGRAPHIC STUDIES: I have personally reviewed the radiological images as listed and agreed with the findings in the report. No results found.   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 Iron  levels adequate. Will likely need consideration of erythropoietin  if hemoglobin remains less than 9 and hypertension is well controlled.  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 Unlikely to be related to her monoclonal paraproteinemia-follows with Dr. Christophe Cram completely rule out possibility of light chain deposition disease or AL amyloidosis. We will defer to nephrology regarding need for kidney biopsy to rule out these possibilities if indicated.  PLAN -Discussed lab results from today, 04/13/2023, with the patient. CBC shows patient is anemic with hemoglobin of 8.8 g/dL and decreased hematocrit of 26.3%. CMP shows elevated glucose level at 233, slightly elevated creatinine level of 1.35, decreased alkaline phosphate level at 33, and slightly decreased AST level of 11. -Advised to control  salt intake and wear compression socks to help with bilateral leg edema.  -Discussed the reasons for anemia which is not primarily related to MGUS. M spike still fluctuating aroud 1.3- 1.8. It is 1.8g/dl today. -Discussed the option of 3 doses of IV Iron  or starting Iron   supplement. Patient wants to try po Iron  supplement since IV Iron  causes her diarrhea.  -Will prescribe iron  polysaccharide.   -Recommend to follow-up with her PCP regarding elevated glucose levels.   -Recommend to use Afrin nasal spray for epistaxis as instructed.  -Answered all of patient's and her daughter's questions.  -Patient has no clinical or lab findings suggestive of progression to multiple myeloma at this time.   FOLLOW-UP: ***  The total time spent in the appointment was *** minutes* .  All of the patient's questions were answered with apparent satisfaction. The patient knows to call the clinic with any problems, questions or concerns.   Jacquelyn Matt MD MS AAHIVMS [facility] Hematology/Oncology Physician [facility]  .*Total Encounter Time as defined by the Centers for Medicare and Medicaid Services includes, in addition to the face-to-face time of a patient visit (documented in the note above) non-face-to-face time: obtaining and reviewing outside history, ordering and reviewing medications, tests or procedures, care coordination (communications with other health care professionals or caregivers) and documentation in the medical record.    I,Mitra Faeizi,acting as a Neurosurgeon for Jacquelyn Matt, MD.,have documented all relevant documentation on the behalf of Jacquelyn Matt, MD,as directed by  Jacquelyn Matt, MD while in the presence of Jacquelyn Matt, MD.  ***

## 2024-01-22 ENCOUNTER — Other Ambulatory Visit

## 2024-01-22 ENCOUNTER — Ambulatory Visit: Admitting: Hematology

## 2024-01-28 ENCOUNTER — Ambulatory Visit: Admitting: "Endocrinology

## 2024-02-25 ENCOUNTER — Ambulatory Visit: Admitting: "Endocrinology

## 2024-02-29 ENCOUNTER — Encounter (HOSPITAL_COMMUNITY)
Admission: RE | Admit: 2024-02-29 | Discharge: 2024-02-29 | Disposition: A | Discharge status: Home / Self Care | Source: Ambulatory Visit | Attending: Internal Medicine | Admitting: Internal Medicine

## 2024-02-29 VITALS — BP 183/81 | HR 107 | Temp 97.3°F | Resp 16

## 2024-02-29 DIAGNOSIS — N1832 Chronic kidney disease, stage 3b: Secondary | ICD-10-CM | POA: Insufficient documentation

## 2024-02-29 DIAGNOSIS — D631 Anemia in chronic kidney disease: Secondary | ICD-10-CM | POA: Diagnosis present

## 2024-02-29 LAB — POCT HEMOGLOBIN-HEMACUE: Hemoglobin: 8.6 g/dL — ABNORMAL LOW (ref 12.0–15.0)

## 2024-02-29 MED ORDER — EPOETIN ALFA-EPBX 10000 UNIT/ML IJ SOLN
INTRAMUSCULAR | Status: AC
Start: 2024-02-29 — End: 2024-02-29
  Filled 2024-02-29: qty 1

## 2024-02-29 MED ORDER — EPOETIN ALFA-EPBX 10000 UNIT/ML IJ SOLN
10000.0000 [IU] | INTRAMUSCULAR | Status: DC
  Administered 2024-02-29: 10000 [IU] via SUBCUTANEOUS

## 2024-03-01 ENCOUNTER — Ambulatory Visit: Admitting: Hematology

## 2024-03-01 ENCOUNTER — Other Ambulatory Visit

## 2024-03-09 ENCOUNTER — Other Ambulatory Visit: Payer: Self-pay | Admitting: "Endocrinology

## 2024-03-09 ENCOUNTER — Other Ambulatory Visit: Payer: Self-pay

## 2024-03-09 DIAGNOSIS — E1165 Type 2 diabetes mellitus with hyperglycemia: Secondary | ICD-10-CM

## 2024-03-09 MED ORDER — METFORMIN HCL ER 500 MG PO TB24: 500.0000 mg | ORAL_TABLET | Freq: Two times a day (BID) | ORAL | 0 refills | Status: DC

## 2024-03-09 NOTE — Telephone Encounter (Signed)
 Requested Prescriptions   Pending Prescriptions Disp Refills   metFORMIN  (GLUCOPHAGE -XR) 500 MG 24 hr tablet 60 tablet 0    Sig: Take 1 tablet (500 mg total) by mouth 2 (two) times daily with a meal.

## 2024-03-14 ENCOUNTER — Ambulatory Visit (HOSPITAL_COMMUNITY)
Admission: RE | Admit: 2024-03-14 | Discharge: 2024-03-14 | Disposition: A | Discharge status: Home / Self Care | Source: Ambulatory Visit | Attending: Internal Medicine | Admitting: Internal Medicine

## 2024-03-14 VITALS — BP 178/83 | HR 99 | Temp 97.3°F | Resp 16

## 2024-03-14 DIAGNOSIS — N1832 Chronic kidney disease, stage 3b: Secondary | ICD-10-CM | POA: Diagnosis present

## 2024-03-14 DIAGNOSIS — D631 Anemia in chronic kidney disease: Secondary | ICD-10-CM | POA: Diagnosis present

## 2024-03-14 LAB — POCT HEMOGLOBIN-HEMACUE: Hemoglobin: 8.9 g/dL — ABNORMAL LOW (ref 12.0–15.0)

## 2024-03-14 MED ORDER — EPOETIN ALFA-EPBX 10000 UNIT/ML IJ SOLN
20000.0000 [IU] | INTRAMUSCULAR | Status: DC
  Administered 2024-03-14: 20000 [IU] via SUBCUTANEOUS

## 2024-03-14 MED ORDER — EPOETIN ALFA-EPBX 10000 UNIT/ML IJ SOLN
INTRAMUSCULAR | Status: AC
  Filled 2024-03-14: qty 2

## 2024-03-21 ENCOUNTER — Telehealth: Payer: Self-pay | Admitting: Hematology

## 2024-03-22 ENCOUNTER — Inpatient Hospital Stay

## 2024-03-22 ENCOUNTER — Ambulatory Visit: Admitting: Hematology

## 2024-03-28 ENCOUNTER — Encounter (HOSPITAL_COMMUNITY)

## 2024-03-28 ENCOUNTER — Encounter (HOSPITAL_COMMUNITY)
Admission: RE | Admit: 2024-03-28 | Discharge: 2024-03-28 | Disposition: A | Discharge status: Home / Self Care | Source: Ambulatory Visit | Attending: Internal Medicine | Admitting: Internal Medicine

## 2024-03-28 VITALS — BP 165/79 | HR 86 | Temp 97.3°F | Resp 16

## 2024-03-28 DIAGNOSIS — N1832 Chronic kidney disease, stage 3b: Secondary | ICD-10-CM | POA: Diagnosis present

## 2024-03-28 DIAGNOSIS — D631 Anemia in chronic kidney disease: Secondary | ICD-10-CM | POA: Insufficient documentation

## 2024-03-28 LAB — FERRITIN: Ferritin: 270 ng/mL (ref 11–307)

## 2024-03-28 LAB — POCT HEMOGLOBIN-HEMACUE: Hemoglobin: 9.3 g/dL — ABNORMAL LOW (ref 12.0–15.0)

## 2024-03-28 LAB — IRON AND TIBC
Iron: 64 ug/dL (ref 28–170)
Saturation Ratios: 24 % (ref 10.4–31.8)
TIBC: 272 ug/dL (ref 250–450)
UIBC: 208 ug/dL

## 2024-03-28 MED ORDER — EPOETIN ALFA-EPBX 40000 UNIT/ML IJ SOLN
30000.0000 [IU] | INTRAMUSCULAR | Status: DC
  Administered 2024-03-28 (×2): 30000 [IU] via SUBCUTANEOUS

## 2024-03-28 MED ORDER — EPOETIN ALFA-EPBX 40000 UNIT/ML IJ SOLN
INTRAMUSCULAR | Status: AC
  Filled 2024-03-28: qty 1

## 2024-03-29 ENCOUNTER — Ambulatory Visit: Admitting: "Endocrinology

## 2024-04-01 ENCOUNTER — Telehealth (HOSPITAL_COMMUNITY): Payer: Self-pay | Admitting: Pharmacy Technician

## 2024-04-01 NOTE — Telephone Encounter (Signed)
 Auth Submission: NO AUTH NEEDED Site of care: [facility] Payer: [employer] Medication & CPT/J Code(s) submitted: Venofer (Iron Sucrose) J1756 Diagnosis Code: D63.1 Route of submission (phone, fax, portal):  Phone # Fax # Auth type: Buy/Bill HB Units/visits requested: 500mg  x 2 doses Reference number:  Approval from: 04/01/24 to 08/17/24     Dagoberto Armour, CPhT [facility] Phone: [phone] 04/01/2024

## 2024-04-06 ENCOUNTER — Other Ambulatory Visit: Payer: Self-pay | Admitting: "Endocrinology

## 2024-04-06 DIAGNOSIS — E1165 Type 2 diabetes mellitus with hyperglycemia: Secondary | ICD-10-CM

## 2024-04-11 ENCOUNTER — Encounter (HOSPITAL_COMMUNITY)
Admission: RE | Admit: 2024-04-11 | Discharge: 2024-04-11 | Disposition: A | Discharge status: Home / Self Care | Source: Ambulatory Visit | Attending: Internal Medicine | Admitting: Internal Medicine

## 2024-04-11 VITALS — BP 166/82 | HR 89 | Temp 97.1°F | Resp 16

## 2024-04-11 DIAGNOSIS — N1832 Chronic kidney disease, stage 3b: Secondary | ICD-10-CM

## 2024-04-11 LAB — POCT HEMOGLOBIN-HEMACUE: Hemoglobin: 10.1 g/dL — ABNORMAL LOW (ref 12.0–15.0)

## 2024-04-11 MED ORDER — EPOETIN ALFA-EPBX 40000 UNIT/ML IJ SOLN
INTRAMUSCULAR | Status: AC
  Filled 2024-04-11: qty 1

## 2024-04-11 MED ORDER — EPOETIN ALFA-EPBX 40000 UNIT/ML IJ SOLN
30000.0000 [IU] | INTRAMUSCULAR | Status: DC
  Administered 2024-04-11: 30000 [IU] via SUBCUTANEOUS

## 2024-04-19 ENCOUNTER — Inpatient Hospital Stay: Admitting: Hematology

## 2024-04-19 ENCOUNTER — Inpatient Hospital Stay: Attending: Family

## 2024-04-22 ENCOUNTER — Telehealth: Payer: Self-pay | Admitting: Pharmacy Technician

## 2024-04-22 NOTE — Telephone Encounter (Signed)
 Auth Submission: NO AUTH NEEDED Site of care: Site of care: [facility] Payer: [employer] Medication & CPT/J Code(s) submitted: RETACRIT  Diagnosis Code: N18.3 - D63 Route of submission (phone, fax, portal):  Phone # Fax # Auth type: Buy/Bill HB Units/visits requested: 10,000 U Q2WKS Reference number:  Approval from: 04/22/24 to 08/17/24

## 2024-04-25 ENCOUNTER — Other Ambulatory Visit: Payer: Self-pay | Admitting: Hematology

## 2024-04-25 ENCOUNTER — Ambulatory Visit (HOSPITAL_COMMUNITY)
Admission: RE | Admit: 2024-04-25 | Discharge: 2024-04-25 | Disposition: A | Discharge status: Home / Self Care | Source: Ambulatory Visit | Attending: Internal Medicine | Admitting: Internal Medicine

## 2024-04-25 VITALS — BP 211/94 | HR 70 | Temp 97.9°F | Resp 16

## 2024-04-25 DIAGNOSIS — D631 Anemia in chronic kidney disease: Secondary | ICD-10-CM | POA: Diagnosis present

## 2024-04-25 DIAGNOSIS — N183 Chronic kidney disease, stage 3 unspecified: Secondary | ICD-10-CM | POA: Insufficient documentation

## 2024-04-25 DIAGNOSIS — N1832 Chronic kidney disease, stage 3b: Secondary | ICD-10-CM | POA: Insufficient documentation

## 2024-04-25 LAB — POCT HEMOGLOBIN-HEMACUE: Hemoglobin: 11 g/dL — ABNORMAL LOW (ref 12.0–15.0)

## 2024-04-25 MED ORDER — IRON SUCROSE 500 MG IVPB - SIMPLE MED
500.0000 mg | INTRAVENOUS | Status: DC
  Administered 2024-04-25: 500 mg via INTRAVENOUS
  Filled 2024-04-25: qty 500

## 2024-04-25 MED ORDER — EPOETIN ALFA-EPBX 20000 UNIT/ML IJ SOLN
INTRAMUSCULAR | Status: AC
  Filled 2024-04-25: qty 1

## 2024-04-25 MED ORDER — EPOETIN ALFA-EPBX 40000 UNIT/ML IJ SOLN: 30000.0000 [IU] | INTRAMUSCULAR | Status: DC

## 2024-04-25 MED ORDER — CLONIDINE HCL 0.1 MG PO TABS
0.1000 mg | ORAL_TABLET | Freq: Once | ORAL | Status: AC | PRN
  Administered 2024-04-25: 0.1 mg via ORAL

## 2024-04-25 MED ORDER — CLONIDINE HCL 0.1 MG PO TABS
ORAL_TABLET | ORAL | Status: AC
  Filled 2024-04-25: qty 1

## 2024-04-25 MED ORDER — EPOETIN ALFA-EPBX 10000 UNIT/ML IJ SOLN
10000.0000 [IU] | INTRAMUSCULAR | Status: DC
  Administered 2024-04-25: 10000 [IU] via SUBCUTANEOUS

## 2024-04-25 MED ORDER — EPOETIN ALFA-EPBX 20000 UNIT/ML IJ SOLN
20000.0000 [IU] | INTRAMUSCULAR | Status: DC
  Administered 2024-04-25: 20000 [IU] via SUBCUTANEOUS

## 2024-04-25 MED ORDER — EPOETIN ALFA-EPBX 10000 UNIT/ML IJ SOLN
INTRAMUSCULAR | Status: AC
  Filled 2024-04-25: qty 1

## 2024-04-26 ENCOUNTER — Other Ambulatory Visit: Payer: Self-pay

## 2024-04-27 ENCOUNTER — Encounter (HOSPITAL_COMMUNITY): Payer: Self-pay

## 2024-04-27 ENCOUNTER — Encounter (HOSPITAL_COMMUNITY): Payer: Self-pay | Admitting: Internal Medicine

## 2024-04-27 NOTE — Telephone Encounter (Signed)
 Schedule appointment?

## 2024-04-27 NOTE — Telephone Encounter (Signed)
 This will need to be decided/addressed by PCP. Thx GK

## 2024-04-28 ENCOUNTER — Other Ambulatory Visit (HOSPITAL_COMMUNITY): Payer: Self-pay | Admitting: Internal Medicine

## 2024-04-28 ENCOUNTER — Other Ambulatory Visit: Payer: Self-pay | Admitting: Physician Assistant

## 2024-04-28 DIAGNOSIS — D631 Anemia in chronic kidney disease: Secondary | ICD-10-CM | POA: Insufficient documentation

## 2024-04-28 MED ORDER — CARVEDILOL 12.5 MG PO TABS: 12.5000 mg | ORAL_TABLET | Freq: Two times a day (BID) | ORAL | 0 refills | Status: DC

## 2024-04-28 NOTE — Addendum Note (Signed)
 Addended by: DARIO IZETTA CROME on: 04/28/2024 08:29 AM   Modules accepted: Orders

## 2024-05-04 ENCOUNTER — Encounter (HOSPITAL_COMMUNITY): Payer: Self-pay

## 2024-05-04 ENCOUNTER — Encounter (HOSPITAL_COMMUNITY): Payer: Self-pay | Admitting: Internal Medicine

## 2024-05-04 ENCOUNTER — Ambulatory Visit: Attending: Cardiology | Admitting: Cardiology

## 2024-05-04 ENCOUNTER — Encounter: Payer: Self-pay | Admitting: Cardiology

## 2024-05-04 VITALS — BP 187/99 | HR 79 | Resp 16 | Ht 63.0 in | Wt 138.2 lb

## 2024-05-04 DIAGNOSIS — E782 Mixed hyperlipidemia: Secondary | ICD-10-CM | POA: Diagnosis present

## 2024-05-04 DIAGNOSIS — N1832 Chronic kidney disease, stage 3b: Secondary | ICD-10-CM | POA: Insufficient documentation

## 2024-05-04 DIAGNOSIS — I1A0 Resistant hypertension: Secondary | ICD-10-CM | POA: Insufficient documentation

## 2024-05-04 DIAGNOSIS — E1122 Type 2 diabetes mellitus with diabetic chronic kidney disease: Secondary | ICD-10-CM | POA: Insufficient documentation

## 2024-05-04 MED ORDER — AMLODIPINE BESYLATE 10 MG PO TABS: 10.0000 mg | ORAL_TABLET | Freq: Every evening | ORAL | 3 refills | Status: AC

## 2024-05-04 MED ORDER — ISOSORBIDE MONONITRATE ER 60 MG PO TB24: 60.0000 mg | ORAL_TABLET | Freq: Every morning | ORAL | 3 refills | Status: AC

## 2024-05-04 NOTE — Patient Instructions (Addendum)
 Medication Instructions:  Your physician has recommended you make the following change in your medication:  START: isosorbide  mononitrate (Imdur ) 60 mg by mouth once daily in the morning  Amlodipine  (Norvasc ) 10 mg by mouth once daily in the evening  REMOVED: hydralazine  from your medication list it was reported medication is not taken   *If you need a refill on your cardiac medications before your next appointment, please call your pharmacy*  Lab Work: TODAY at [facility] on the 1st floor: CMP, aldosterone renin w/ ratio, TSH   If you have labs (blood work) drawn today and your tests are completely normal, you will receive your results only by: MyChart Message (if you have MyChart) OR A paper copy in the mail If you have any lab test that is abnormal or we need to change your treatment, we will call you to review the results.  Testing/Procedures: Your physician has requested that you have a renal artery duplex. During this test, an ultrasound is used to evaluate blood flow to the kidneys. Allow one hour for this exam. Do not eat after midnight the day before and avoid carbonated beverages. Take your medications as you usually do.   Your physician has referred you to the Pharmacy Clinic to manage High Blood Pressure.   Follow-Up: At [facility], you and your health needs are our priority.  As part of our continuing mission to provide you with exceptional heart care, our providers are all part of one team.  This team includes your primary Cardiologist (physician) and Advanced Practice Providers or APPs (Physician Assistants and Nurse Practitioners) who all work together to provide you with the care you need, when you need it.  Your next appointment:   6 month(s)  Provider:   Madonna Large, DO

## 2024-05-04 NOTE — Progress Notes (Signed)
 Cardiology Office Note:  .   ID:  Farhana, Fellows [birth year], MRN [id number] PCP:  Lorren Greig PARAS, NP  Former Cardiology Providers: Dr. Alvan [facility] Providers Cardiologist:  Madonna Large, DO , [facility] (established care 05/04/24) Electrophysiologist:  None  Click to update primary MD,subspecialty MD or APP then REFRESH:1}    Chief Complaint  Patient presents with   Hypertension   Follow-up    History of Present Illness: .   Jennifer Molina is a 68 y.o. [place] female whose past medical history and cardiovascular risk factors includes: HTN, T2DM, HLD, prolonged history of NSAID use, proteinuria.  Interpreter accompanies her at today's office.   Formally under the care of Dr. Alvan who last saw Clyde Upshaw back in Feb 2024 for hypertension management. I am seeing her for the first time to re-establishing care.   Prior to seeing Dr. Alvan her SBP would range between 173 - 190 mmHg.  Patient follows up with Dr. Dalene at [facility] given her underlying chronic kidney disease.  Patient denies anginal chest pain or heart failure symptoms.  Given the patient's underlying mild cognitive impairment/documented dementia and language barrier complicates delivering appropriate care.  These factors are impacting medication management, raises concerns for accuracy of what medications she is taking and which doses she has skipped.  Her daughter informs me that after her recent hospitalization olmesartan  and chlorthalidone  were discontinued.  Patient is also out of her other antihypertensive medications  ?Amlodipine  as well as hydralazine .  Review of electronic medical records does not illustrate any significant workup for secondary hypertension causes.  Review of Systems: .   Review of Systems  Cardiovascular:  Negative for chest pain, claudication, irregular heartbeat, leg swelling, near-syncope, orthopnea, palpitations, paroxysmal nocturnal dyspnea and syncope.   Respiratory:  Negative for shortness of breath.   Hematologic/Lymphatic: Negative for bleeding problem.    Studies Reviewed:   EKG: EKG Interpretation Date/Time:  Wednesday May 04 2024 10:30:41 EDT Ventricular Rate:  80 PR Interval:  188 QRS Duration:  78 QT Interval:  392 QTC Calculation: 452 R Axis:   73  Text Interpretation: Normal sinus rhythm Nonspecific ST and T wave abnormality When compared with ECG of 30-Jul-2023 13:58, ST & T wave abnormality more pronounced compared to prior tracing. Confirmed by Large Madonna [phone]) on 05/04/2024 10:43:25 AM  Echocardiogram: 07/31/2023  1. Left ventricular ejection fraction, by estimation, is 60 to 65%. Left  ventricular ejection fraction by 3D volume is 58 %. The left ventricle has  normal function. The left ventricle has no regional wall motion  abnormalities. There is mild left  ventricular hypertrophy. Left ventricular diastolic parameters are  indeterminate.   2. Right ventricular systolic function is normal. The right ventricular  size is normal. Tricuspid regurgitation signal is inadequate for assessing  PA pressure.   3. Left atrial size was mildly dilated.   4. A small pericardial effusion is present. There is no evidence of  cardiac tamponade. BP 159/69, HR 87 bpm.   5. The mitral valve is normal in structure. Trivial mitral valve  regurgitation. No evidence of mitral stenosis.   6. The aortic valve is tricuspid. There is mild calcification of the  aortic valve. Aortic valve regurgitation is not visualized. No aortic  stenosis is present.   7. The inferior vena cava is normal in size with greater than 50%  respiratory variability, suggesting right atrial pressure of 3 mmHg.   Ultrasound renal 08/2020 Suspect medical renal disease changes. No  evidence of renal mass or hydronephrosis.  RADIOLOGY: CT abdomen pelvis with contrast: December 2024. Adrenals/Urinary Tract: Adrenal glands are unremarkable. Kidneys  are normal, without renal calculi, focal lesion, or hydronephrosis. Aortic atherosclerosis See report for additional details  Risk Assessment/Calculations:   NA   Labs:       Latest Ref Rng & Units 04/25/2024    9:14 AM 04/11/2024    9:17 AM 03/28/2024    2:03 PM  CBC  Hemoglobin 12.0 - 15.0 g/dL 88.9  89.8  9.3        Latest Ref Rng & Units 05/04/2024   11:49 AM 08/05/2023    4:59 PM 08/02/2023    5:31 AM  BMP  Glucose 70 - 99 mg/dL 81  863  867   BUN 8 - 27 mg/dL 34  16  21   Creatinine 0.57 - 1.00 mg/dL 7.99  8.31  8.17   BUN/Creat Ratio 12 - 28 17     Sodium 134 - 144 mmol/L 138  135  137   Potassium 3.5 - 5.2 mmol/L 5.3  3.8  4.1   Chloride 96 - 106 mmol/L 104  105  108   CO2 20 - 29 mmol/L 21  19  23    Calcium  8.7 - 10.3 mg/dL 9.1  8.8  8.4       Latest Ref Rng & Units 05/04/2024   11:49 AM 08/05/2023    4:59 PM 08/02/2023    5:31 AM  CMP  Glucose 70 - 99 mg/dL 81  863  867   BUN 8 - 27 mg/dL 34  16  21   Creatinine 0.57 - 1.00 mg/dL 7.99  8.31  8.17   Sodium 134 - 144 mmol/L 138  135  137   Potassium 3.5 - 5.2 mmol/L 5.3  3.8  4.1   Chloride 96 - 106 mmol/L 104  105  108   CO2 20 - 29 mmol/L 21  19  23    Calcium  8.7 - 10.3 mg/dL 9.1  8.8  8.4   Total Protein 6.0 - 8.5 g/dL 7.2  6.6    Total Bilirubin 0.0 - 1.2 mg/dL <9.7  0.8    Alkaline Phos 49 - 135 IU/L 42  34    AST 0 - 40 IU/L 14  35    ALT 0 - 32 IU/L 12  25      Lab Results  Component Value Date   CHOL 157 06/19/2021   HDL 41 06/19/2021   LDLCALC 80 06/19/2021   LDLDIRECT 60.0 11/20/2022   TRIG 218 (H) 06/19/2021   CHOLHDL 3.8 06/19/2021   No results for input(s): LIPOA in the last 8760 hours. No components found for: NTPROBNP No results for input(s): PROBNP in the last 8760 hours. Recent Labs    05/04/24 1149  TSH 3.470    Physical Exam:    Today's Vitals   05/04/24 1028 05/04/24 1033 05/04/24 1045  BP: (!) 211/105 (!) 206/105 (!) 187/99  Pulse: 79    Resp: 16    SpO2:  98%    Weight: 138 lb 3.2 oz (62.7 kg)    Height: 5' 3 (1.6 m)     Body mass index is 24.48 kg/m. Wt Readings from Last 3 Encounters:  05/04/24 138 lb 3.2 oz (62.7 kg)  08/05/23 150 lb 9.6 oz (68.3 kg)  04/13/23 152 lb 8 oz (69.2 kg)    Physical Exam  Constitutional: No distress.  hemodynamically stable  Neck: No  JVD present.  Cardiovascular: Normal rate, regular rhythm, S1 normal and S2 normal. Exam reveals no gallop, no S3 and no S4.  No murmur heard. Pulmonary/Chest: Effort normal and breath sounds normal. No stridor. She has no wheezes. She has no rales.  Musculoskeletal:        General: No edema.     Cervical back: Neck supple.  Skin: Skin is warm.     Impression & Recommendation(s):  Impression:   ICD-10-CM   1. Resistant hypertension  I1A.0 EKG 12-Lead    amLODipine  (NORVASC ) 10 MG tablet    isosorbide  mononitrate (IMDUR ) 60 MG 24 hr tablet    AMB Referral to [facility] Pharm-D    Comprehensive metabolic panel with GFR    Aldosterone + renin activity w/ ratio    TSH    VAS US  RENAL ARTERY DUPLEX    TSH    Aldosterone + renin activity w/ ratio    Comprehensive metabolic panel with GFR    2. Type 2 diabetes mellitus with stage 3b chronic kidney disease, without long-term current use of insulin  (HCC)  E11.22    N18.32     3. Mixed hyperlipidemia  E78.2        Recommendation(s):  Resistant hypertension Office blood pressures are not well-controlled. Home blood pressures according to the daughter 140-150 mmHg. Medication reconciliation is difficult due to language barrier and patient not knowing what she is exactly taking [facility] reviewed from 01/2024 Since patient is not on thiazides or ACE/ARB's we will check renin and Aldo ratio Prior CT abdomen December 2024 noted unremarkable adrenal glands. Check TSH. Check CMP She would benefit from sleep study-will arrange at next visit once blood pressures are better controlled Renal vascular  duplex to evaluate for renal artery stenosis. Pharm.D. consult for blood pressure management Refill amlodipine  Start Imdur  60 mg p.o. every morning Continue carvedilol  12.5 mg p.o. twice daily, in the past she was switched from metoprolol  to carvedilol  Continue Farxiga  10 mg p.o. daily Based on EMR spironolactone  discontinued secondary to hyperkalemia Patient is educated that uncontrolled hypertension can lead to worsening renal function, risk for stroke, cardiomyopathy Will need close follow-up.  Type 2 diabetes mellitus with stage 3b chronic kidney disease, without long-term current use of insulin  (HCC) Re emphasized importance of glycemic control. Hemoglobin A1c 5.9 as of December 2024. Cardiology following peripherally, managed by primary care provider.  Mixed hyperlipidemia Continue Lipitor 40 mg p.o. daily  Orders Placed:  Orders Placed This Encounter  Procedures   Comprehensive metabolic panel with GFR    Standing Status:   Future    Number of Occurrences:   1    Expiration Date:   05/04/2025   Aldosterone + renin activity w/ ratio    Standing Status:   Future    Number of Occurrences:   1    Expiration Date:   05/04/2025   TSH    Standing Status:   Future    Number of Occurrences:   1    Expiration Date:   05/04/2025   AMB Referral to [facility] Pharm-D    Referral Priority:   Routine    Referral Type:   Consultation    Referral Reason:   Specialty Services Required    Number of Visits Requested:   1   EKG 12-Lead     Final Medication List:    Meds ordered this encounter  Medications   amLODipine  (NORVASC ) 10 MG tablet    Sig: Take 1 tablet (  10 mg total) by mouth every evening.    Dispense:  90 tablet    Refill:  3   isosorbide  mononitrate (IMDUR ) 60 MG 24 hr tablet    Sig: Take 1 tablet (60 mg total) by mouth every morning.    Dispense:  90 tablet    Refill:  3    Medications Discontinued During This Encounter  Medication Reason   amLODipine  (NORVASC ) 10  MG tablet Reorder     Current Outpatient Medications:    ASPIRIN  LOW DOSE 81 MG tablet, TAKE 1 TABLET(81 MG) BY MOUTH DAILY (Patient taking differently: Take 81 mg by mouth daily.), Disp: 30 tablet, Rfl: 2   atorvastatin  (LIPITOR) 40 MG tablet, Take 1 tablet by mouth once daily, Disp: 30 tablet, Rfl: 0   azithromycin  (ZITHROMAX ) 500 MG tablet, Take 1 tablet (500 mg total) by mouth daily., Disp: 2 tablet, Rfl: 0   Blood Glucose Monitoring Suppl (ACCU-CHEK GUIDE ME) w/Device KIT, Use as instructed to check blood sugars, Disp: 1 kit, Rfl: 0   Blood Glucose Monitoring Suppl (TRUE METRIX METER) w/Device KIT, Use as directed, Disp: 1 kit, Rfl: 0   carvedilol  (COREG ) 12.5 MG tablet, Take 1 tablet (12.5 mg total) by mouth 2 (two) times daily., Disp: 60 tablet, Rfl: 0   dapagliflozin  propanediol (FARXIGA ) 10 MG TABS tablet, TAKE 1 TABLET(10 MG) BY MOUTH DAILY BEFORE BREAKFAST, Disp: 90 tablet, Rfl: 3   gabapentin  (NEURONTIN ) 300 MG capsule, TAKE 1 CAPSULE(300 MG) BY MOUTH AT BEDTIME, Disp: 30 capsule, Rfl: 5   glucose blood (ACCU-CHEK GUIDE) test strip, 1 each by Other route daily. And lancets 1/day, Disp: 100 each, Rfl: 3   glucose blood (TRUE METRIX BLOOD GLUCOSE TEST) test strip, Use as instructed, Disp: 100 each, Rfl: 12   glucose blood test strip, Use as instructed, Disp: 100 each, Rfl: 12   glucose blood test strip, Use as instructed, Disp: 100 each, Rfl: 12   isosorbide  mononitrate (IMDUR ) 60 MG 24 hr tablet, Take 1 tablet (60 mg total) by mouth every morning., Disp: 90 tablet, Rfl: 3   metFORMIN  (GLUCOPHAGE -XR) 500 MG 24 hr tablet, TAKE 1 TABLET(500 MG) BY MOUTH TWICE DAILY WITH A MEAL, Disp: 180 tablet, Rfl: 0   TRUEplus Lancets 28G MISC, Use as directed, Disp: 100 each, Rfl: 4   amLODipine  (NORVASC ) 10 MG tablet, Take 1 tablet (10 mg total) by mouth every evening., Disp: 90 tablet, Rfl: 3   hydrALAZINE  (APRESOLINE ) 100 MG tablet, Take 1 tablet (100 mg total) by mouth every 8 (eight) hours.  (Patient not taking: Reported on 05/04/2024), Disp: 90 tablet, Rfl: 0  Consent:   NA  Disposition:   30-month follow-up with myself sooner if needed. Will establish with Pharm.D. sooner for uptitration of antihypertensive medications and will follow-up with nephrology  Her questions and concerns were addressed to her satisfaction. She voices understanding of the recommendations provided during this encounter.    Signed, Madonna Michele HAS, [facility]  A Division of [place] [facility] [address] [place], [place] [address]

## 2024-05-05 ENCOUNTER — Ambulatory Visit: Payer: Self-pay | Admitting: Cardiology

## 2024-05-05 DIAGNOSIS — I1A0 Resistant hypertension: Secondary | ICD-10-CM

## 2024-05-05 DIAGNOSIS — I10 Essential (primary) hypertension: Secondary | ICD-10-CM

## 2024-05-08 LAB — COMPREHENSIVE METABOLIC PANEL WITH GFR
ALT: 12 IU/L (ref 0–32)
AST: 14 IU/L (ref 0–40)
Albumin: 3.6 g/dL — ABNORMAL LOW (ref 3.9–4.9)
Alkaline Phosphatase: 42 IU/L — ABNORMAL LOW (ref 49–135)
BUN/Creatinine Ratio: 17 (ref 12–28)
BUN: 34 mg/dL — ABNORMAL HIGH (ref 8–27)
Bilirubin Total: <0.2 mg/dL (ref 0.0–1.2)
CO2: 21 mmol/L (ref 20–29)
Calcium: 9.1 mg/dL (ref 8.7–10.3)
Chloride: 104 mmol/L (ref 96–106)
Creatinine, Ser: 2 mg/dL — ABNORMAL HIGH (ref 0.57–1.00)
Globulin, Total: 3.6 g/dL (ref 1.5–4.5)
Glucose: 81 mg/dL (ref 70–99)
Potassium: 5.3 mmol/L — ABNORMAL HIGH (ref 3.5–5.2)
Sodium: 138 mmol/L (ref 134–144)
Total Protein: 7.2 g/dL (ref 6.0–8.5)
eGFR: 26 mL/min/1.73 — ABNORMAL LOW (ref >59)

## 2024-05-08 LAB — TSH: TSH: 3.47 u[IU]/mL (ref 0.450–4.500)

## 2024-05-08 LAB — ALDOSTERONE + RENIN ACTIVITY W/ RATIO
Aldos/Renin Ratio: 12.7 (ref 0.0–30.0)
Aldosterone: 3.9 ng/dL (ref 0.0–30.0)
Renin Activity, Plasma: 0.307 ng/mL/h (ref 0.167–5.380)

## 2024-05-09 ENCOUNTER — Ambulatory Visit (HOSPITAL_COMMUNITY)
Admission: RE | Admit: 2024-05-09 | Discharge: 2024-05-09 | Disposition: A | Discharge status: Home / Self Care | Source: Ambulatory Visit | Attending: Internal Medicine | Admitting: Internal Medicine

## 2024-05-09 ENCOUNTER — Inpatient Hospital Stay (HOSPITAL_COMMUNITY): Admission: RE | Admit: 2024-05-09 | Source: Ambulatory Visit

## 2024-05-09 ENCOUNTER — Other Ambulatory Visit: Payer: Self-pay

## 2024-05-09 VITALS — BP 158/81 | HR 70 | Temp 98.1°F | Resp 16

## 2024-05-09 DIAGNOSIS — N183 Chronic kidney disease, stage 3 unspecified: Secondary | ICD-10-CM

## 2024-05-09 DIAGNOSIS — I10 Essential (primary) hypertension: Secondary | ICD-10-CM

## 2024-05-09 DIAGNOSIS — D631 Anemia in chronic kidney disease: Secondary | ICD-10-CM

## 2024-05-09 DIAGNOSIS — N1832 Chronic kidney disease, stage 3b: Secondary | ICD-10-CM | POA: Diagnosis not present

## 2024-05-09 DIAGNOSIS — I1A0 Resistant hypertension: Secondary | ICD-10-CM

## 2024-05-09 LAB — POCT HEMOGLOBIN-HEMACUE: Hemoglobin: 9.9 g/dL — ABNORMAL LOW (ref 12.0–15.0)

## 2024-05-09 MED ORDER — EPOETIN ALFA-EPBX 10000 UNIT/ML IJ SOLN
INTRAMUSCULAR | Status: AC
  Filled 2024-05-09: qty 1

## 2024-05-09 MED ORDER — CLONIDINE HCL 0.1 MG PO TABS: 0.1000 mg | ORAL_TABLET | ORAL | Status: DC | PRN

## 2024-05-09 MED ORDER — EPOETIN ALFA-EPBX 10000 UNIT/ML IJ SOLN
10000.0000 [IU] | Freq: Once | INTRAMUSCULAR | Status: AC
  Administered 2024-05-09: 10000 [IU] via SUBCUTANEOUS

## 2024-05-09 MED ORDER — IRON SUCROSE 500 MG IVPB - SIMPLE MED
500.0000 mg | INTRAVENOUS | Status: DC
  Administered 2024-05-09: 500 mg via INTRAVENOUS
  Filled 2024-05-09: qty 500

## 2024-05-09 MED ORDER — EPOETIN ALFA-EPBX 20000 UNIT/ML IJ SOLN
20000.0000 [IU] | Freq: Once | INTRAMUSCULAR | Status: AC
  Administered 2024-05-09: 20000 [IU] via SUBCUTANEOUS

## 2024-05-09 MED ORDER — EPOETIN ALFA-EPBX 40000 UNIT/ML IJ SOLN: 30000.0000 [IU] | Freq: Once | INTRAMUSCULAR | Status: DC

## 2024-05-09 MED ORDER — EPOETIN ALFA-EPBX 20000 UNIT/ML IJ SOLN
INTRAMUSCULAR | Status: AC
  Filled 2024-05-09: qty 1

## 2024-05-12 NOTE — Progress Notes (Signed)
 Patient viewed in [place] of her lab results.   Last read by Curlee Huguenin at 10:48AM on 05/12/2024.

## 2024-05-13 ENCOUNTER — Ambulatory Visit: Admitting: "Endocrinology

## 2024-05-16 ENCOUNTER — Ambulatory Visit (HOSPITAL_COMMUNITY)
Admission: RE | Admit: 2024-05-16 | Discharge: 2024-05-16 | Disposition: A | Discharge status: Home / Self Care | Source: Ambulatory Visit | Attending: Cardiology | Admitting: Cardiology

## 2024-05-16 DIAGNOSIS — I1A0 Resistant hypertension: Secondary | ICD-10-CM | POA: Diagnosis not present

## 2024-05-23 ENCOUNTER — Encounter (HOSPITAL_COMMUNITY)
Admission: RE | Admit: 2024-05-23 | Discharge: 2024-05-23 | Disposition: A | Discharge status: Home / Self Care | Source: Ambulatory Visit | Attending: Internal Medicine | Admitting: Internal Medicine

## 2024-05-23 VITALS — BP 147/74 | HR 76 | Temp 97.1°F | Resp 16

## 2024-05-23 DIAGNOSIS — N183 Chronic kidney disease, stage 3 unspecified: Secondary | ICD-10-CM | POA: Diagnosis present

## 2024-05-23 DIAGNOSIS — D631 Anemia in chronic kidney disease: Secondary | ICD-10-CM | POA: Insufficient documentation

## 2024-05-23 LAB — IRON AND TIBC
Iron: 78 ug/dL (ref 28–170)
Saturation Ratios: 31 % (ref 10.4–31.8)
TIBC: 252 ug/dL (ref 250–450)
UIBC: 174 ug/dL

## 2024-05-23 LAB — POCT HEMOGLOBIN-HEMACUE: Hemoglobin: 10.7 g/dL — ABNORMAL LOW (ref 12.0–15.0)

## 2024-05-23 LAB — FERRITIN: Ferritin: 393 ng/mL — ABNORMAL HIGH (ref 11–307)

## 2024-05-23 MED ORDER — EPOETIN ALFA-EPBX 10000 UNIT/ML IJ SOLN
10000.0000 [IU] | Freq: Once | INTRAMUSCULAR | Status: AC
  Administered 2024-05-23: 10000 [IU] via SUBCUTANEOUS

## 2024-05-23 MED ORDER — EPOETIN ALFA-EPBX 20000 UNIT/ML IJ SOLN
20000.0000 [IU] | Freq: Once | INTRAMUSCULAR | Status: AC
  Administered 2024-05-23: 20000 [IU] via SUBCUTANEOUS

## 2024-05-23 MED ORDER — EPOETIN ALFA-EPBX 10000 UNIT/ML IJ SOLN
INTRAMUSCULAR | Status: AC
  Filled 2024-05-23: qty 1

## 2024-05-23 MED ORDER — EPOETIN ALFA-EPBX 20000 UNIT/ML IJ SOLN
INTRAMUSCULAR | Status: AC
  Filled 2024-05-23: qty 1

## 2024-05-27 NOTE — Progress Notes (Signed)
 Attempted to call patient for results. No answer left a vm to return the call back

## 2024-06-02 ENCOUNTER — Encounter (HOSPITAL_COMMUNITY): Payer: Self-pay | Admitting: Internal Medicine

## 2024-06-02 ENCOUNTER — Ambulatory Visit: Attending: Cardiology | Admitting: Pharmacist

## 2024-06-02 ENCOUNTER — Encounter: Payer: Self-pay | Admitting: Pharmacist

## 2024-06-02 ENCOUNTER — Encounter (HOSPITAL_COMMUNITY): Payer: Self-pay

## 2024-06-02 ENCOUNTER — Other Ambulatory Visit (HOSPITAL_COMMUNITY): Payer: Self-pay

## 2024-06-02 VITALS — BP 184/81 | HR 84

## 2024-06-02 DIAGNOSIS — I1A0 Resistant hypertension: Secondary | ICD-10-CM | POA: Insufficient documentation

## 2024-06-02 MED ORDER — HYDRALAZINE HCL 25 MG PO TABS
ORAL_TABLET | ORAL | 2 refills | Status: AC
  Filled 2024-06-02: qty 60, 30d supply, fill #0

## 2024-06-02 MED ORDER — OMRON 3 SERIES BP MONITOR DEVI
0 refills | Status: AC
  Filled 2024-06-02: qty 1, 30d supply, fill #0

## 2024-06-02 NOTE — Progress Notes (Signed)
 Patient ID: Jennifer Molina                 DOB: Jun 23, 1956                      MRN: [id number]     HPI: Jennifer Molina is a 68 y.o. female referred by Dr. Michele to HTN clinic. PMH is significant for HTN, T2DM, dementia, anemia, and CKD.  Isosorbide  started at last visit.  Patient presents today with daughter who provides translation as patient speaks [place]  Reports patient checks BP at home and is typically in 130s-140s.  Last night was 154. Daughter is not sure which readings on home cuff are the patients's or her husbands.   Currently managed on carvedilol , isosorbide , and amlodipine . Reports compliance but will occasionally feel dizzy.  Daughter says patient continues to eat salty food and uses soy sauce.   Is not exercising. Denies caffeine use. Reports BP is typically elevated at medical visits.  Not on ACE/ARB/MRA due to CKD. Last Scr 2.0 on 05/04/24 with eGFR of 26. Receiving IV iron  infusions twice weekly.  Current HTN meds:  Carvedilol  12.5mg  BID Isosorbide  60mg  daily Amlodipine  10mg  daily  Previously tried:  Losartan  Metoprolol  Spironolactone   BP goal: <140/90 due to renal function  Wt Readings from Last 3 Encounters:  05/04/24 138 lb 3.2 oz (62.7 kg)  08/05/23 150 lb 9.6 oz (68.3 kg)  04/13/23 152 lb 8 oz (69.2 kg)   BP Readings from Last 3 Encounters:  05/23/24 (!) 147/74  05/09/24 (!) 158/81  05/04/24 (!) 187/99   Pulse Readings from Last 3 Encounters:  05/23/24 76  05/09/24 70  05/04/24 79    Renal function: CrCl cannot be calculated (Patient's most recent lab result is older than the maximum 21 days allowed.).  Past Medical History:  Diagnosis Date   Chronic kidney disease IIIb    Dementia (HCC) 03/28/2019   DM (diabetes mellitus), type 2, uncontrolled 2015   Has poor vision.  Family not aware of A1C or what sugars run.  Believe not well controlled.  Not able to monitor due to cost of monitoring equipment   Hypertension 2015    Current Outpatient  Medications on File Prior to Visit  Medication Sig Dispense Refill   amLODipine  (NORVASC ) 10 MG tablet Take 1 tablet (10 mg total) by mouth every evening. 90 tablet 3   ASPIRIN  LOW DOSE 81 MG tablet TAKE 1 TABLET(81 MG) BY MOUTH DAILY (Patient taking differently: Take 81 mg by mouth daily.) 30 tablet 2   atorvastatin  (LIPITOR) 40 MG tablet Take 1 tablet by mouth once daily 30 tablet 0   azithromycin  (ZITHROMAX ) 500 MG tablet Take 1 tablet (500 mg total) by mouth daily. 2 tablet 0   Blood Glucose Monitoring Suppl (ACCU-CHEK GUIDE ME) w/Device KIT Use as instructed to check blood sugars 1 kit 0   Blood Glucose Monitoring Suppl (TRUE METRIX METER) w/Device KIT Use as directed 1 kit 0   carvedilol  (COREG ) 12.5 MG tablet Take 1 tablet (12.5 mg total) by mouth 2 (two) times daily. 60 tablet 0   dapagliflozin  propanediol (FARXIGA ) 10 MG TABS tablet TAKE 1 TABLET(10 MG) BY MOUTH DAILY BEFORE BREAKFAST 90 tablet 3   gabapentin  (NEURONTIN ) 300 MG capsule TAKE 1 CAPSULE(300 MG) BY MOUTH AT BEDTIME 30 capsule 5   glucose blood (ACCU-CHEK GUIDE) test strip 1 each by Other route daily. And lancets 1/day 100 each 3   glucose blood (TRUE METRIX BLOOD  GLUCOSE TEST) test strip Use as instructed 100 each 12   glucose blood test strip Use as instructed 100 each 12   glucose blood test strip Use as instructed 100 each 12   isosorbide  mononitrate (IMDUR ) 60 MG 24 hr tablet Take 1 tablet (60 mg total) by mouth every morning. 90 tablet 3   metFORMIN  (GLUCOPHAGE -XR) 500 MG 24 hr tablet TAKE 1 TABLET(500 MG) BY MOUTH TWICE DAILY WITH A MEAL 180 tablet 0   TRUEplus Lancets 28G MISC Use as directed 100 each 4   No current facility-administered medications on file prior to visit.    No Known Allergies   Assessment/Plan:  1. Hypertension -  Patient BP elevated in room at 194/81 and rechecked on right arm at 184/81. Possible white coat HTN may be contributing.   Recommend patient have her own BP cuff, will send to  pharmacy. Instructed patient and daughter to check BP once or twice daily. Recommend switching to low sodium soy sauce.  Discussed next options in HTN management. Since patient nervous about feeling dizzy, will start hydralazine  25mg  only when systolic BP >160. Patient in agreement. Will continue current meds and recheck in 1 month.  Continue: Amlodipine  10mg  daily Carvedilol  12.5mg  daily Isosorbide  60mg  daily Start hydralazine  25mg  for systolic BP >160 Recheck in 1 month  Medford Bolk, PharmD, [place], CDCES, CPP [facility] [address] [place], [place] [address] Phone: [phone]; Fax: [phone] 06/02/2024 11:56 AM

## 2024-06-02 NOTE — Patient Instructions (Addendum)
 It was nice meeting you 2 today  We would like your blood pressure less than 140/90  Please continue your: Carvedilol  12.5mg  twice a day Amlodipine  10mg  daily Isosorbide  60mg  daily  I will start a new medication called hydralazine  25mg . You can take one pill if your systolic blood pressure (top number) is greater than 160  I have also sent you a new blood pressure cuff downstairs  I will see you back in a month. Please bring your new blood pressure cuff with you  Let us  know if you have any questions  Medford Bolk, PharmD, BCACP, CDCES, CPP [facility] [address] [place], [place] [address] Phone: [phone]; Fax: [phone] 06/02/2024 9:14 AM

## 2024-06-04 ENCOUNTER — Other Ambulatory Visit: Payer: Self-pay | Admitting: Physician Assistant

## 2024-06-06 ENCOUNTER — Ambulatory Visit (HOSPITAL_COMMUNITY)
Admission: RE | Admit: 2024-06-06 | Discharge: 2024-06-06 | Disposition: A | Discharge status: Home / Self Care | Source: Ambulatory Visit | Attending: Internal Medicine | Admitting: Internal Medicine

## 2024-06-06 VITALS — BP 180/75 | HR 81 | Temp 97.5°F | Resp 16

## 2024-06-06 DIAGNOSIS — N183 Chronic kidney disease, stage 3 unspecified: Secondary | ICD-10-CM | POA: Diagnosis present

## 2024-06-06 DIAGNOSIS — D631 Anemia in chronic kidney disease: Secondary | ICD-10-CM | POA: Insufficient documentation

## 2024-06-06 LAB — POCT HEMOGLOBIN-HEMACUE: Hemoglobin: 11.8 g/dL — ABNORMAL LOW (ref 12.0–15.0)

## 2024-06-06 MED ORDER — EPOETIN ALFA-EPBX 20000 UNIT/ML IJ SOLN
20000.0000 [IU] | Freq: Once | INTRAMUSCULAR | Status: AC
  Administered 2024-06-06: 20000 [IU] via SUBCUTANEOUS

## 2024-06-06 MED ORDER — EPOETIN ALFA-EPBX 20000 UNIT/ML IJ SOLN
INTRAMUSCULAR | Status: AC
  Filled 2024-06-06: qty 1

## 2024-06-06 MED ORDER — EPOETIN ALFA-EPBX 10000 UNIT/ML IJ SOLN
INTRAMUSCULAR | Status: AC
  Filled 2024-06-06: qty 1

## 2024-06-06 MED ORDER — EPOETIN ALFA-EPBX 10000 UNIT/ML IJ SOLN
10000.0000 [IU] | Freq: Once | INTRAMUSCULAR | Status: AC
  Administered 2024-06-06: 10000 [IU] via SUBCUTANEOUS

## 2024-06-07 ENCOUNTER — Other Ambulatory Visit (HOSPITAL_COMMUNITY): Payer: Self-pay | Admitting: Internal Medicine

## 2024-06-20 ENCOUNTER — Encounter (HOSPITAL_COMMUNITY)
Admission: RE | Admit: 2024-06-20 | Discharge: 2024-06-20 | Disposition: A | Discharge status: Home / Self Care | Source: Ambulatory Visit | Attending: Internal Medicine | Admitting: Internal Medicine

## 2024-06-20 VITALS — BP 153/79 | HR 79 | Temp 97.8°F | Resp 16

## 2024-06-20 DIAGNOSIS — D631 Anemia in chronic kidney disease: Secondary | ICD-10-CM | POA: Diagnosis present

## 2024-06-20 DIAGNOSIS — N183 Chronic kidney disease, stage 3 unspecified: Secondary | ICD-10-CM | POA: Diagnosis present

## 2024-06-20 LAB — IRON AND TIBC
Iron: 78 ug/dL (ref 28–170)
Saturation Ratios: 34 % — ABNORMAL HIGH (ref 10.4–31.8)
TIBC: 230 ug/dL — ABNORMAL LOW (ref 250–450)
UIBC: 152 ug/dL

## 2024-06-20 LAB — FERRITIN: Ferritin: 278 ng/mL (ref 11–307)

## 2024-06-20 LAB — POCT HEMOGLOBIN-HEMACUE: Hemoglobin: 11.2 g/dL — ABNORMAL LOW (ref 12.0–15.0)

## 2024-06-20 MED ORDER — EPOETIN ALFA-EPBX 10000 UNIT/ML IJ SOLN
INTRAMUSCULAR | Status: AC
  Filled 2024-06-20: qty 1

## 2024-06-20 MED ORDER — EPOETIN ALFA-EPBX 10000 UNIT/ML IJ SOLN
10000.0000 [IU] | Freq: Once | INTRAMUSCULAR | Status: AC
  Administered 2024-06-20: 10000 [IU] via SUBCUTANEOUS

## 2024-07-04 ENCOUNTER — Encounter (HOSPITAL_COMMUNITY)
Admission: RE | Admit: 2024-07-04 | Discharge: 2024-07-04 | Disposition: A | Source: Ambulatory Visit | Attending: Internal Medicine | Admitting: Internal Medicine

## 2024-07-04 VITALS — BP 174/75 | HR 83 | Temp 97.4°F | Resp 16

## 2024-07-04 DIAGNOSIS — D631 Anemia in chronic kidney disease: Secondary | ICD-10-CM

## 2024-07-04 DIAGNOSIS — N183 Chronic kidney disease, stage 3 unspecified: Secondary | ICD-10-CM

## 2024-07-04 LAB — POCT HEMOGLOBIN-HEMACUE: Hemoglobin: 11.4 g/dL — ABNORMAL LOW (ref 12.0–15.0)

## 2024-07-04 MED ORDER — EPOETIN ALFA-EPBX 10000 UNIT/ML IJ SOLN
INTRAMUSCULAR | Status: AC
  Filled 2024-07-04: qty 1

## 2024-07-04 MED ORDER — EPOETIN ALFA-EPBX 10000 UNIT/ML IJ SOLN
10000.0000 [IU] | Freq: Once | INTRAMUSCULAR | Status: AC
  Administered 2024-07-04: 10000 [IU] via SUBCUTANEOUS

## 2024-07-06 ENCOUNTER — Encounter: Payer: Self-pay | Admitting: Pharmacist

## 2024-07-06 ENCOUNTER — Ambulatory Visit: Attending: Cardiovascular Disease | Admitting: Pharmacist

## 2024-07-06 VITALS — BP 165/77

## 2024-07-06 DIAGNOSIS — I1A0 Resistant hypertension: Secondary | ICD-10-CM | POA: Diagnosis present

## 2024-07-06 NOTE — Progress Notes (Signed)
 Patient ID: Jennifer Molina                 DOB: [birth year]                      MRN: [id number]     HPI: Jennifer Molina is a 68 y.o. female referred by Dr. Michele to HTN clinic. PMH is significant for HTN, T2DM, dementia, anemia, and CKD.   At last visit, patient reported home readings of 130s-140s but did not bring log. It was discovered she shares her home BP cuff with her husband so she was not sure readings were hers. Rx for her own BP cuff was sent to pharmacy.  Hydralazine  was added at last visit for systolic BP >160. Patient was nervous regarding side effects.  Patient presents today with daughter and Librarian, academic. She brought her home log. Brought all home meds with her.  11/18: 155/85 11/17: 155/99 11/15: 118/72 11/14: 157/74 11/13: 185/98 11/11: 177/81 11/10: 187/95  She often forgets to take her hydralazine  when SBP is elevated.  Daughter says patient continues to eat salty food and uses soy sauce.   Is not exercising. Denies caffeine use. Reports BP is typically elevated at medical visits.  Not on ACE/ARB/MRA due to CKD. Last Scr 2.0 on 05/04/24 with eGFR of 26. Receiving IV iron  infusions twice weekly.  Current HTN meds:  Carvedilol  12.5mg  BID Isosorbide  60mg  daily Amlodipine  10mg  daily Hydralazine  25mg  BID for SBP >160  Previously tried:  Losartan  Metoprolol  Spironolactone   BP goal: <140/90 due to renal function  Wt Readings from Last 3 Encounters:  05/04/24 138 lb 3.2 oz (62.7 kg)  08/05/23 150 lb 9.6 oz (68.3 kg)  04/13/23 152 lb 8 oz (69.2 kg)   BP Readings from Last 3 Encounters:  07/04/24 (!) 174/75  06/20/24 (!) 153/79  06/06/24 (!) 180/75   Pulse Readings from Last 3 Encounters:  07/04/24 83  06/20/24 79  06/06/24 81    Renal function: CrCl cannot be calculated (Patient's most recent lab result is older than the maximum 21 days allowed.).  Past Medical History:  Diagnosis Date   Chronic kidney disease IIIb    Dementia (HCC) 03/28/2019   DM  (diabetes mellitus), type 2, uncontrolled 2015   Has poor vision.  Family not aware of A1C or what sugars run.  Believe not well controlled.  Not able to monitor due to cost of monitoring equipment   Hypertension 2015    Current Outpatient Medications on File Prior to Visit  Medication Sig Dispense Refill   amLODipine  (NORVASC ) 10 MG tablet Take 1 tablet (10 mg total) by mouth every evening. 90 tablet 3   ASPIRIN  LOW DOSE 81 MG tablet TAKE 1 TABLET(81 MG) BY MOUTH DAILY 30 tablet 2   atorvastatin  (LIPITOR) 40 MG tablet Take 1 tablet by mouth once daily 30 tablet 0   azithromycin  (ZITHROMAX ) 500 MG tablet Take 1 tablet (500 mg total) by mouth daily. 2 tablet 0   Blood Glucose Monitoring Suppl (ACCU-CHEK GUIDE ME) w/Device KIT Use as instructed to check blood sugars 1 kit 0   Blood Glucose Monitoring Suppl (TRUE METRIX METER) w/Device KIT Use as directed 1 kit 0   Blood Pressure Monitoring (OMRON 3 SERIES BP MONITOR) DEVI Use to check blood pressure twice daily 1 each 0   carvedilol  (COREG ) 12.5 MG tablet Take 1 tablet (12.5 mg total) by mouth 2 (two) times daily. 180 tablet 3   dapagliflozin  propanediol (FARXIGA )  10 MG TABS tablet TAKE 1 TABLET(10 MG) BY MOUTH DAILY BEFORE BREAKFAST 90 tablet 3   gabapentin  (NEURONTIN ) 300 MG capsule TAKE 1 CAPSULE(300 MG) BY MOUTH AT BEDTIME 30 capsule 5   glucose blood (ACCU-CHEK GUIDE) test strip 1 each by Other route daily. And lancets 1/day 100 each 3   glucose blood (TRUE METRIX BLOOD GLUCOSE TEST) test strip Use as instructed 100 each 12   glucose blood test strip Use as instructed 100 each 12   glucose blood test strip Use as instructed 100 each 12   hydrALAZINE  (APRESOLINE ) 25 MG tablet Take one tablet by mouth up to twice daily if systolic blood pressure is >160 60 tablet 2   isosorbide  mononitrate (IMDUR ) 60 MG 24 hr tablet Take 1 tablet (60 mg total) by mouth every morning. 90 tablet 3   metFORMIN  (GLUCOPHAGE -XR) 500 MG 24 hr tablet TAKE 1  TABLET(500 MG) BY MOUTH TWICE DAILY WITH A MEAL 180 tablet 0   TRUEplus Lancets 28G MISC Use as directed 100 each 4   No current facility-administered medications on file prior to visit.    No Known Allergies   Assessment/Plan:  1. Hypertension -  Patient BP elevated in room at 162/91 and repeated at 165/77.    Went through each of patient's medications with her. Discussed indication for each and when to take. Patient felt more comfortable afterwards. Encouraged her to remember to take hydralazine  for SBP >160.    Gave new BP log sheets and explained morning meds should include isosorbide , carvedilol  and +/- hydralazine  and evening meds should be amlodipine , carvedilol , +/- hydralazine . F/u in 1 month.   Continue: Amlodipine  10mg  daily Carvedilol  12.5mg  twice daily Isosorbide  60mg  daily Continue hydralazine  25mg  for systolic BP >160 Recheck in 1 month  Medford Bolk, PharmD, [place], CDCES, CPP [facility] [address] [place], [place] [address] Phone: [phone]; Fax: [phone] 07/06/2024 7:37 AM

## 2024-07-06 NOTE — Patient Instructions (Addendum)
 Good seeing you again  We would like your blood pressure to stay less than 140/90  Remember to take your hydralazine  if your blood pressure is greater than 160  Continue:  Morning: Isosorbide  Carvedilol   Evening Amlodipine  Carvedilol     Continue to check blood pressure at home and I will see you back in 1 month   Medford Bolk, PharmD, [place], CDCES, CPP [facility] [address] [place], [place] [address] Phone: [phone]; Fax: [phone] 07/06/2024 9:37 AM

## 2024-07-07 ENCOUNTER — Ambulatory Visit: Admitting: "Endocrinology

## 2024-07-19 ENCOUNTER — Encounter (HOSPITAL_COMMUNITY)
Admission: RE | Admit: 2024-07-19 | Discharge: 2024-07-19 | Disposition: A | Discharge status: Home / Self Care | Source: Ambulatory Visit | Attending: Internal Medicine | Admitting: Internal Medicine

## 2024-07-19 VITALS — BP 144/76 | HR 77 | Temp 97.3°F | Resp 16

## 2024-07-19 DIAGNOSIS — D631 Anemia in chronic kidney disease: Secondary | ICD-10-CM | POA: Diagnosis present

## 2024-07-19 DIAGNOSIS — N183 Chronic kidney disease, stage 3 unspecified: Secondary | ICD-10-CM | POA: Insufficient documentation

## 2024-07-19 LAB — IRON AND TIBC
Iron: 104 ug/dL (ref 28–170)
Saturation Ratios: 45 % — ABNORMAL HIGH (ref 10.4–31.8)
TIBC: 232 ug/dL — ABNORMAL LOW (ref 250–450)
UIBC: 128 ug/dL

## 2024-07-19 LAB — POCT HEMOGLOBIN-HEMACUE: Hemoglobin: 10.8 g/dL — ABNORMAL LOW (ref 12.0–15.0)

## 2024-07-19 LAB — FERRITIN: Ferritin: 411 ng/mL — ABNORMAL HIGH (ref 11–307)

## 2024-07-19 MED ORDER — EPOETIN ALFA-EPBX 10000 UNIT/ML IJ SOLN
INTRAMUSCULAR | Status: AC
  Filled 2024-07-19: qty 1

## 2024-07-19 MED ORDER — EPOETIN ALFA-EPBX 10000 UNIT/ML IJ SOLN
10000.0000 [IU] | Freq: Once | INTRAMUSCULAR | Status: AC
  Administered 2024-07-19: 10000 [IU] via SUBCUTANEOUS

## 2024-08-01 ENCOUNTER — Inpatient Hospital Stay (HOSPITAL_COMMUNITY)
Admission: RE | Admit: 2024-08-01 | Discharge: 2024-08-01 | Disposition: A | Source: Ambulatory Visit | Attending: Internal Medicine | Admitting: Internal Medicine

## 2024-08-01 VITALS — BP 174/75 | HR 83 | Temp 96.4°F | Resp 16

## 2024-08-01 DIAGNOSIS — N183 Chronic kidney disease, stage 3 unspecified: Secondary | ICD-10-CM

## 2024-08-01 LAB — POCT HEMOGLOBIN-HEMACUE: Hemoglobin: 11 g/dL — ABNORMAL LOW (ref 12.0–15.0)

## 2024-08-01 MED ORDER — EPOETIN ALFA-EPBX 10000 UNIT/ML IJ SOLN
INTRAMUSCULAR | Status: AC
  Filled 2024-08-01: qty 1

## 2024-08-01 MED ORDER — EPOETIN ALFA-EPBX 10000 UNIT/ML IJ SOLN
10000.0000 [IU] | Freq: Once | INTRAMUSCULAR | Status: AC
  Administered 2024-08-01: 09:00:00 10000 [IU] via SUBCUTANEOUS

## 2024-08-02 ENCOUNTER — Ambulatory Visit: Admitting: Pharmacist

## 2024-08-02 ENCOUNTER — Encounter (HOSPITAL_COMMUNITY)

## 2024-08-15 ENCOUNTER — Encounter (HOSPITAL_COMMUNITY)
Admission: RE | Admit: 2024-08-15 | Discharge: 2024-08-15 | Disposition: A | Discharge status: Home / Self Care | Source: Ambulatory Visit | Attending: Internal Medicine | Admitting: Internal Medicine

## 2024-08-15 ENCOUNTER — Encounter (HOSPITAL_COMMUNITY)

## 2024-08-15 VITALS — BP 178/80 | HR 83 | Temp 97.2°F | Resp 16

## 2024-08-15 DIAGNOSIS — N183 Chronic kidney disease, stage 3 unspecified: Secondary | ICD-10-CM | POA: Diagnosis not present

## 2024-08-15 DIAGNOSIS — D631 Anemia in chronic kidney disease: Secondary | ICD-10-CM

## 2024-08-15 LAB — POCT HEMOGLOBIN-HEMACUE: Hemoglobin: 10.7 g/dL — ABNORMAL LOW (ref 12.0–15.0)

## 2024-08-15 MED ORDER — EPOETIN ALFA-EPBX 10000 UNIT/ML IJ SOLN
INTRAMUSCULAR | Status: AC
  Filled 2024-08-15: qty 1

## 2024-08-15 MED ORDER — EPOETIN ALFA-EPBX 10000 UNIT/ML IJ SOLN
10000.0000 [IU] | Freq: Once | INTRAMUSCULAR | Status: AC
  Administered 2024-08-15: 10000 [IU] via SUBCUTANEOUS

## 2024-08-23 ENCOUNTER — Telehealth (HOSPITAL_COMMUNITY): Payer: Self-pay | Admitting: Pharmacy Technician

## 2024-08-23 NOTE — Telephone Encounter (Signed)
 Auth Submission: NO AUTH NEEDED Site of care: [facility] Payer: [employer] Medication & CPT/J Code(s) submitted: [id number] - RETACRIT  NON-ESRD  Diagnosis Code: N18.30, D63.1 Route of submission (phone, fax, portal):  Phone # Fax # Auth type: Buy/Bill HB Units/visits requested: 10,000 units every 2 weeks Reference number:  Approval from: 08/18/24 to 04/22/25 (referral expires)    Dagoberto Armour, CPhT [facility] Phone: [phone] 08/23/2024

## 2024-08-29 ENCOUNTER — Ambulatory Visit (HOSPITAL_COMMUNITY)
Admission: RE | Admit: 2024-08-29 | Discharge: 2024-08-29 | Disposition: A | Discharge status: Home / Self Care | Source: Ambulatory Visit | Attending: Internal Medicine | Admitting: Internal Medicine

## 2024-08-29 VITALS — BP 143/75 | HR 70 | Temp 97.0°F | Resp 16

## 2024-08-29 DIAGNOSIS — D631 Anemia in chronic kidney disease: Secondary | ICD-10-CM | POA: Insufficient documentation

## 2024-08-29 DIAGNOSIS — N183 Chronic kidney disease, stage 3 unspecified: Secondary | ICD-10-CM | POA: Diagnosis present

## 2024-08-29 LAB — POCT HEMOGLOBIN-HEMACUE: Hemoglobin: 10.2 g/dL — ABNORMAL LOW (ref 12.0–15.0)

## 2024-08-29 LAB — IRON AND TIBC
Iron: 107 ug/dL (ref 28–170)
Saturation Ratios: 40 % — ABNORMAL HIGH (ref 10.4–31.8)
TIBC: 267 ug/dL (ref 250–450)
UIBC: 160 ug/dL

## 2024-08-29 LAB — FERRITIN: Ferritin: 708 ng/mL — ABNORMAL HIGH (ref 11–307)

## 2024-08-29 MED ORDER — EPOETIN ALFA-EPBX 10000 UNIT/ML IJ SOLN
10000.0000 [IU] | Freq: Once | INTRAMUSCULAR | Status: AC
  Administered 2024-08-29: 10000 [IU] via SUBCUTANEOUS

## 2024-08-29 MED ORDER — EPOETIN ALFA-EPBX 10000 UNIT/ML IJ SOLN
INTRAMUSCULAR | Status: AC
  Filled 2024-08-29: qty 1

## 2024-09-06 ENCOUNTER — Other Ambulatory Visit

## 2024-09-06 ENCOUNTER — Ambulatory Visit: Admitting: "Endocrinology

## 2024-09-06 ENCOUNTER — Encounter: Payer: Self-pay | Admitting: "Endocrinology

## 2024-09-06 VITALS — BP 140/80 | HR 84 | Ht 63.0 in | Wt 143.0 lb

## 2024-09-06 DIAGNOSIS — Z7984 Long term (current) use of oral hypoglycemic drugs: Secondary | ICD-10-CM

## 2024-09-06 DIAGNOSIS — E782 Mixed hyperlipidemia: Secondary | ICD-10-CM | POA: Diagnosis not present

## 2024-09-06 DIAGNOSIS — E1165 Type 2 diabetes mellitus with hyperglycemia: Secondary | ICD-10-CM

## 2024-09-06 NOTE — Patient Instructions (Signed)

## 2024-09-06 NOTE — Progress Notes (Signed)
 "    Outpatient Endocrinology Note Jennifer Birmingham, MD  09/06/24   Jennifer Molina May 20, 1956 [id number]  Referring Provider: Jaycee Greig PARAS, NP Primary Care Provider: Jaycee Greig PARAS, NP Reason for consultation: Subjective   Assessment & Plan  Diagnoses and all orders for this visit:  Uncontrolled type 2 diabetes mellitus with hyperglycemia, without long-term current use of insulin  (HCC) -     Lipid panel -     Microalbumin / creatinine urine ratio -     Hemoglobin A1c -     Cancel: Comprehensive metabolic panel with GFR -     Fructosamine -     Comprehensive metabolic panel with GFR  Mixed hypercholesterolemia and hypertriglyceridemia    Diabetes complicated by retinopathy, neuropathy Hba1c goal less than 7.0, current Hba1c is Lab Results  Component Value Date   HGBA1C 5.9 (H) 07/31/2023   HGBA1C 6.1 (A) 01/05/2023   HGBA1C 9.2 (H) 09/23/2022   Will recommend for the following change of medications to: Translation was achieved by family/son; who denied desire for translator  Ran out of all meds; last f/u was 1.5 years ago, not keeping up with PCP either Labs today; if WNL will refill Metformin  XR 500 mg two pills daily, Farxiga  10 mg daily, glimepiride  2 mg after supper  On gabapentin  300 mg at bedtime by another provider Discussed need for compliance  Previously; Discussed the need to check blood sugar alternating times of the day, including before meals and before bedtime Discussed hypoglycemic potential of glimepiride  and the need to avoid glimepiride  if the blood sugars running low/skipping meals Instructed to notify us  if the GFR dropped, patient following with nephrology Daughter understands and explained to patient Discussed footcare due to neuropathy  No known contraindications to any of above medications  Hyperlipidemia -Last LDL at goal: 60 -on atorvastatin  40 mg every day: ran out? -Follow low fat diet and exercise   -Blood pressure goal <140/90 -  Microalbumin/creatinine at goal < 30 -On olemsartan 40 mg every day; not on it anymore for unknown reason -diet changes including salt restriction -limit eating outside -counseled BP targets per standards of diabetes care -Uncontrolled blood pressure can lead to retinopathy, nephropathy and cardiovascular and atherosclerotic heart disease  Reviewed and counseled on: -A1C target -Blood sugar targets -Complications of uncontrolled diabetes  -Checking blood sugar before meals and bedtime and bring log next visit -All medications with mechanism of action and side effects -Hypoglycemia management: rule of 15's, Glucagon Emergency Kit and medical alert ID -low-carb low-fat plate-method diet -At least 20 minutes of physical activity per day -Annual dilated retinal eye exam and foot exam -compliance and follow up needs -follow up as scheduled or earlier if problem gets worse  Call if blood sugar is less than 70 or consistently above 250    Take a 15 gm snack of carbohydrate at bedtime before you go to sleep if your blood sugar is less than 100.    If you are going to fast after midnight for a test or procedure, ask your physician for instructions on how to reduce/decrease your insulin  dose.    Call if blood sugar is less than 70 or consistently above 250  -Treating a low sugar by rule of 15  (15 gms of sugar every 15 min until sugar is more than 70) If you feel your sugar is low, test your sugar to be sure If your sugar is low (less than 70), then take 15 grams of a fast acting Carbohydrate (  3-4 glucose tablets or glucose gel or 4 ounces of juice or regular soda) Recheck your sugar 15 min after treating low to make sure it is more than 70 If sugar is still less than 70, treat again with 15 grams of carbohydrate          Don't drive the hour of hypoglycemia  If unconscious/unable to eat or drink by mouth, use glucagon injection or nasal spray baqsimi and call 911. Can repeat again in 15 min  if still unconscious.  Return in about 3 months (around 12/05/2024) for visit, labs today.   I have reviewed current medications, nurse's notes, allergies, vital signs, past medical and surgical history, family medical history, and social history for this encounter. Counseled patient on symptoms, examination findings, lab findings, imaging results, treatment decisions and monitoring and prognosis. The patient understood the recommendations and agrees with the treatment plan. All questions regarding treatment plan were fully answered.  Jennifer Birmingham, MD  09/06/24    History of Present Illness Jennifer Molina is a 69 y.o. year old female who presents for follow up of Type II diabetes mellitus.  Patient is accompanied by her daughter who is acting as her vietnamese+-other language translator   Jennifer Molina was first diagnosed in 2019.   Diabetes education +  Currently:  Ran out of all medication  Metformin  XR 500 mg two pills daily, Farxiga  10 mg daily, glimepiride  2 mg after supper     Previous history: Previously used: Metformin , Amaryl , heartburn from Rybelsus  Has not been on insulin  except in the hospital in 2020   COMPLICATIONS -  MI/Stroke +  retinopathy, last eye exam 2024 +  neuropathy +  nephropathy  BLOOD SUGAR DATA Checks 2-3 hrs after lunch rarely  234-285  Physical Exam  BP (!) 140/80   Pulse 84   Ht 5' 3 (1.6 m)   Wt 143 lb (64.9 kg)   LMP  (LMP Unknown)   SpO2 95%   BMI 25.33 kg/m    Constitutional: well developed, well nourished Head: normocephalic, atraumatic Eyes: sclera anicteric, no redness Neck: supple Lungs: normal respiratory effort Neurology: alert and oriented Skin: dry, no appreciable rashes Musculoskeletal: no appreciable defects Psychiatric: normal mood and affect Diabetic Foot Exam - Simple   No data filed      Current Medications Patient's Medications  New Prescriptions   No medications on file  Previous Medications   AMLODIPINE   (NORVASC ) 10 MG TABLET    Take 1 tablet (10 mg total) by mouth every evening.   ASPIRIN  LOW DOSE 81 MG TABLET    TAKE 1 TABLET(81 MG) BY MOUTH DAILY   ATORVASTATIN  (LIPITOR) 40 MG TABLET    Take 1 tablet by mouth once daily   AZITHROMYCIN  (ZITHROMAX ) 500 MG TABLET    Take 1 tablet (500 mg total) by mouth daily.   BLOOD GLUCOSE MONITORING SUPPL (ACCU-CHEK GUIDE ME) W/DEVICE KIT    Use as instructed to check blood sugars   BLOOD GLUCOSE MONITORING SUPPL (TRUE METRIX METER) W/DEVICE KIT    Use as directed   BLOOD PRESSURE MONITORING (OMRON 3 SERIES BP MONITOR) DEVI    Use to check blood pressure twice daily   CARVEDILOL  (COREG ) 12.5 MG TABLET    Take 1 tablet (12.5 mg total) by mouth 2 (two) times daily.   DAPAGLIFLOZIN  PROPANEDIOL (FARXIGA ) 10 MG TABS TABLET    TAKE 1 TABLET(10 MG) BY MOUTH DAILY BEFORE BREAKFAST   GABAPENTIN  (NEURONTIN ) 300 MG CAPSULE  TAKE 1 CAPSULE(300 MG) BY MOUTH AT BEDTIME   GLUCOSE BLOOD (ACCU-CHEK GUIDE) TEST STRIP    1 each by Other route daily. And lancets 1/day   GLUCOSE BLOOD (TRUE METRIX BLOOD GLUCOSE TEST) TEST STRIP    Use as instructed   GLUCOSE BLOOD TEST STRIP    Use as instructed   GLUCOSE BLOOD TEST STRIP    Use as instructed   HYDRALAZINE  (APRESOLINE ) 25 MG TABLET    Take one tablet by mouth up to twice daily if systolic blood pressure is >160   ISOSORBIDE  MONONITRATE (IMDUR ) 60 MG 24 HR TABLET    Take 1 tablet (60 mg total) by mouth every morning.   METFORMIN  (GLUCOPHAGE -XR) 500 MG 24 HR TABLET    TAKE 1 TABLET(500 MG) BY MOUTH TWICE DAILY WITH A MEAL   TRUEPLUS LANCETS 28G MISC    Use as directed  Modified Medications   No medications on file  Discontinued Medications   No medications on file    Allergies No Known Allergies  Past Medical History Past Medical History:  Diagnosis Date   Chronic kidney disease IIIb    Dementia (HCC) 03/28/2019   DM (diabetes mellitus), type 2, uncontrolled 2015   Has poor vision.  Family not aware of A1C or what  sugars run.  Believe not well controlled.  Not able to monitor due to cost of monitoring equipment   Hypertension 2015    Past Surgical History History reviewed. No pertinent surgical history.  Family History family history includes Diabetes in her mother.  Social History Social History   Socioeconomic History   Marital status: Married    Spouse name: Joen Enter   Number of children: 7   Years of education: 3   Highest education level: 3rd grade  Occupational History   Occupation: Retired from farming in [place]  Tobacco Use   Smoking status: Never   Smokeless tobacco: Never  Vaping Use   Vaping status: Never Used  Substance and Sexual Activity   Alcohol use: Never   Drug use: Never   Sexual activity: Not on file  Other Topics Concern   Not on file  Social History Narrative   [place] in [place]   Lives at home with husband   Her 2 daughters, 2 sons and their families   All together, 11 people in the home   Social Drivers of Health   Tobacco Use: Low Risk (09/06/2024)   Patient History    Smoking Tobacco Use: Never    Smokeless Tobacco Use: Never    Passive Exposure: Not on file  Financial Resource Strain: Not on file  Food Insecurity: No Food Insecurity (07/30/2023)   Hunger Vital Sign    Worried About Running Out of Food in the Last Year: Never true    Ran Out of Food in the Last Year: Never true  Transportation Needs: No Transportation Needs (07/30/2023)   PRAPARE - Administrator, Civil Service (Medical): No    Lack of Transportation (Non-Medical): No  Physical Activity: Not on file  Stress: Not on file  Social Connections: Unknown (12/31/2021)   [facility]   Social Network    Social Network: Not on file  Intimate Partner Violence: Not At Risk (07/30/2023)   Humiliation, Afraid, Rape, and Kick questionnaire    Fear of Current or Ex-Partner: No    Emotionally Abused: No    Physically Abused: No    Sexually Abused: No  Depression (PHQ2-9): Low Risk (05/02/2022)   Depression (PHQ2-9)    PHQ-2 Score: 0  Alcohol Screen: Not on file  Housing: Low Risk (07/30/2023)   Housing Stability Vital Sign    Unable to Pay for Housing in the Last Year: No    Number of Times Moved in the Last Year: 0    Homeless in the Last Year: No  Utilities: Not At Risk (07/30/2023)   AHC Utilities    Threatened with loss of utilities: No  Health Literacy: Not on file    Lab Results  Component Value Date   HGBA1C 5.9 (H) 07/31/2023   Lab Results  Component Value Date   CHOL 157 06/19/2021   Lab Results  Component Value Date   HDL 41 06/19/2021   Lab Results  Component Value Date   LDLCALC 80 06/19/2021   Lab Results  Component Value Date   TRIG 218 (H) 06/19/2021   Lab Results  Component Value Date   CHOLHDL 3.8 06/19/2021   Lab Results  Component Value Date   CREATININE 2.00 (H) 05/04/2024   Lab Results  Component Value Date   GFR 34.63 (L) 05/04/2023   No results found for: MACKEY CURRENT     Component Value Date/Time   NA 138 05/04/2024 1149   K 5.3 (H) 05/04/2024 1149   CL 104 05/04/2024 1149   CO2 21 05/04/2024 1149   GLUCOSE 81 05/04/2024 1149   GLUCOSE 136 (H) 08/05/2023 1659   BUN 34 (H) 05/04/2024 1149   CREATININE 2.00 (H) 05/04/2024 1149   CREATININE 1.35 (H) 04/13/2023 0950   CALCIUM  9.1 05/04/2024 1149   PROT 7.2 05/04/2024 1149   ALBUMIN 3.6 (L) 05/04/2024 1149   AST 14 05/04/2024 1149   AST 11 (L) 04/13/2023 0950   ALT 12 05/04/2024 1149   ALT 8 04/13/2023 0950   ALKPHOS 42 (L) 05/04/2024 1149   BILITOT <0.2 05/04/2024 1149   BILITOT 0.4 04/13/2023 0950   GFRNONAA 33 (L) 08/05/2023 1659   GFRNONAA 43 (L) 04/13/2023 0950   GFRAA >60 07/25/2019 0412      Latest Ref Rng & Units 05/04/2024   11:49 AM 08/05/2023    4:59 PM 08/02/2023    5:31 AM  BMP  Glucose 70 - 99 mg/dL 81  863  867   BUN 8 - 27 mg/dL 34  16  21   Creatinine 0.57 - 1.00 mg/dL 7.99  8.31   8.17   BUN/Creat Ratio 12 - 28 17     Sodium 134 - 144 mmol/L 138  135  137   Potassium 3.5 - 5.2 mmol/L 5.3  3.8  4.1   Chloride 96 - 106 mmol/L 104  105  108   CO2 20 - 29 mmol/L 21  19  23    Calcium  8.7 - 10.3 mg/dL 9.1  8.8  8.4        Component Value Date/Time   WBC 7.1 08/05/2023 1659   RBC 3.81 (L) 08/05/2023 1659   HGB 10.2 (L) 08/29/2024 1026   HGB 8.8 (L) 04/13/2023 0950   HGB 12.2 03/28/2019 1143   HCT 30.3 (L) 08/05/2023 1659   HCT 37.4 03/28/2019 1143   PLT 453 (H) 08/05/2023 1659   PLT 179 04/13/2023 0950   PLT 295 03/28/2019 1143   MCV 79.5 (L) 08/05/2023 1659   MCV 83 03/28/2019 1143   MCH 25.7 (L) 08/05/2023 1659   MCHC 32.3 08/05/2023 1659   RDW 17.2 (H) 08/05/2023 1659  RDW 14.4 03/28/2019 1143   LYMPHSABS 1.3 04/13/2023 0950   LYMPHSABS 2.5 03/28/2019 1143   MONOABS 0.4 04/13/2023 0950   EOSABS 0.2 04/13/2023 0950   EOSABS 0.1 03/28/2019 1143   BASOSABS 0.0 04/13/2023 0950   BASOSABS 0.1 03/28/2019 1143     Parts of this note may have been dictated using voice recognition software. There may be variances in spelling and vocabulary which are unintentional. Not all errors are proofread. Please notify the dino if any discrepancies are noted or if the meaning of any statement is not clear.   "

## 2024-09-07 LAB — MICROALBUMIN / CREATININE URINE RATIO
Creatinine, Urine: 37 mg/dL (ref 20–275)
Microalb Creat Ratio: 3646 mg/g{creat} — ABNORMAL HIGH
Microalb, Ur: 134.9 mg/dL

## 2024-09-07 LAB — COMPREHENSIVE METABOLIC PANEL WITH GFR
AG Ratio: 0.9 (calc) — ABNORMAL LOW (ref 1.0–2.5)
ALT: 11 U/L (ref 6–29)
AST: 14 U/L (ref 10–35)
Albumin: 3.7 g/dL (ref 3.6–5.1)
Alkaline phosphatase (APISO): 38 U/L (ref 37–153)
BUN/Creatinine Ratio: 27 (calc) — ABNORMAL HIGH (ref 6–22)
BUN: 57 mg/dL — ABNORMAL HIGH (ref 7–25)
CO2: 24 mmol/L (ref 20–32)
Calcium: 9 mg/dL (ref 8.6–10.4)
Chloride: 111 mmol/L — ABNORMAL HIGH (ref 98–110)
Creat: 2.15 mg/dL — ABNORMAL HIGH (ref 0.60–1.00)
Globulin: 4.2 g/dL — ABNORMAL HIGH (ref 1.9–3.7)
Glucose, Bld: 128 mg/dL — ABNORMAL HIGH (ref 65–99)
Potassium: 5 mmol/L (ref 3.5–5.3)
Sodium: 140 mmol/L (ref 135–146)
Total Bilirubin: 0.4 mg/dL (ref 0.2–1.2)
Total Protein: 7.9 g/dL (ref 6.1–8.1)
eGFR: 24 mL/min/1.73m2 — ABNORMAL LOW

## 2024-09-07 LAB — LIPID PANEL
Cholesterol: 241 mg/dL — ABNORMAL HIGH
HDL: 66 mg/dL
LDL Cholesterol (Calc): 154 mg/dL — ABNORMAL HIGH
Non-HDL Cholesterol (Calc): 175 mg/dL — ABNORMAL HIGH
Total CHOL/HDL Ratio: 3.7 (calc)
Triglycerides: 97 mg/dL

## 2024-09-07 LAB — HEMOGLOBIN A1C
Hgb A1c MFr Bld: 6 % — ABNORMAL HIGH
Mean Plasma Glucose: 126 mg/dL
eAG (mmol/L): 7 mmol/L

## 2024-09-08 ENCOUNTER — Other Ambulatory Visit: Payer: Self-pay | Admitting: "Endocrinology

## 2024-09-08 ENCOUNTER — Ambulatory Visit: Payer: Self-pay | Admitting: "Endocrinology

## 2024-09-08 DIAGNOSIS — E785 Hyperlipidemia, unspecified: Secondary | ICD-10-CM

## 2024-09-08 DIAGNOSIS — E1165 Type 2 diabetes mellitus with hyperglycemia: Secondary | ICD-10-CM

## 2024-09-08 MED ORDER — ATORVASTATIN CALCIUM 40 MG PO TABS: 40.0000 mg | ORAL_TABLET | Freq: Every day | ORAL | 1 refills | Status: AC

## 2024-09-08 MED ORDER — DAPAGLIFLOZIN PROPANEDIOL 10 MG PO TABS: ORAL_TABLET | ORAL | 3 refills | Status: AC

## 2024-09-08 MED ORDER — GLIMEPIRIDE 2 MG PO TABS: 2.0000 mg | ORAL_TABLET | Freq: Every day | ORAL | 1 refills | Status: AC

## 2024-09-10 LAB — FRUCTOSAMINE: Fructosamine: 260 umol/L (ref 205–285)

## 2024-09-12 ENCOUNTER — Encounter (HOSPITAL_COMMUNITY)

## 2024-09-12 ENCOUNTER — Ambulatory Visit: Admitting: Pharmacist Clinician (PhC)/ Clinical Pharmacy Specialist

## 2024-09-19 ENCOUNTER — Encounter (HOSPITAL_COMMUNITY)

## 2024-09-21 ENCOUNTER — Ambulatory Visit

## 2024-09-21 VITALS — BP 151/81 | HR 75

## 2024-09-21 DIAGNOSIS — I10 Essential (primary) hypertension: Secondary | ICD-10-CM

## 2024-09-21 MED ORDER — CARVEDILOL 25 MG PO TABS: 25.0000 mg | ORAL_TABLET | Freq: Two times a day (BID) | ORAL | 2 refills | Status: AC

## 2024-09-21 NOTE — Progress Notes (Signed)
 Patient ID: Jennifer Molina                 DOB: 15-Nov-1955                      MRN: [id number]     HPI: Jennifer Molina is a 69 y.o. female referred by Dr. Michele to HTN clinic. PMH is significant for HTN, T2DM, dementia, anemia, and CKD.   09/21/2024 (Today):  Patient presents today for PharmD HTN f/u appointment. Patient was last seen by PharmD in November 2025 where her medications were continued, no changes made. She was not taking hydralazine  if her SBP >160 so she was reeducated on how to take that appropriately.  Patient presents today with [facility] interpreter. She reports that she has been taking all of her medication as prescribed and taking hydralazine  only if SBP > 160. She denies any SOB, headache or chest pain. She does note some shoulder pain occasionally; which I suggested use of prn tylenol  vs. NSAIDs due to her hx of CKD. She presents with blood pressure log from the last month; she has not started checking blood pressure this month yet but intends to start. She states blood pressure monitor has been confirmed for accuracy.   Home BP log: 09/05/24-09/17/24  169/88 76 175/85 75 177/107 85 153/75 81 158/89 84 164/87 80 172/83 83 145/84 85 159/85 87 159/85 87 128/79 79 149/84 80 113/69 78 145/82 80 103/61 75 173/90 83  Current HTN meds:  Carvedilol  12.5mg  BID Isosorbide  60mg  daily Amlodipine  10mg  daily Hydralazine  25mg  BID for SBP >160  Previously tried:  Losartan  Metoprolol  Spironolactone   BP goal: <140/90 due to renal function Labs (08/2024): Scr 2.15 (baseline 1.68-1.74)  Not on ACE/ARB/MRA due to CKD. Last Scr 2.0 on 05/04/24 with eGFR of 26. Receiving IV iron  infusions twice weekly.  07/16/2024: At last visit, patient reported home readings of 130s-140s but did not bring log. It was discovered she shares her home BP cuff with her husband so she was not sure readings were hers. Rx for her own BP cuff was sent to pharmacy.  Hydralazine  was added at last visit for  systolic BP >160. Patient was nervous regarding side effects.  Patient presents today with daughter and Librarian, academic. She brought her home log. Brought all home meds with her.  11/18: 155/85 11/17: 155/99 11/15: 118/72 11/14: 157/74 11/13: 185/98 11/11: 177/81 11/10: 187/95  She often forgets to take her hydralazine  when SBP is elevated.  Daughter says patient continues to eat salty food and uses soy sauce.   Is not exercising. Denies caffeine use. Reports BP is typically elevated at medical visits.  Not on ACE/ARB/MRA due to CKD. Last Scr 2.0 on 05/04/24 with eGFR of 26. Receiving IV iron  infusions twice weekly.  Current HTN meds:  Carvedilol  12.5mg  BID Isosorbide  60mg  daily Amlodipine  10mg  daily Hydralazine  25mg  BID for SBP >160  Previously tried:  Losartan  Metoprolol  Spironolactone   BP goal: <140/90 due to renal function  Wt Readings from Last 3 Encounters:  09/06/24 143 lb (64.9 kg)  05/04/24 138 lb 3.2 oz (62.7 kg)  08/05/23 150 lb 9.6 oz (68.3 kg)   BP Readings from Last 3 Encounters:  09/06/24 (!) 140/80  08/29/24 (!) 143/75  08/15/24 (!) 178/80   Pulse Readings from Last 3 Encounters:  09/06/24 84  08/29/24 70  08/15/24 83    Renal function: CrCl cannot be calculated (Unknown ideal weight.).  Past Medical History:  Diagnosis Date  Chronic kidney disease IIIb    Dementia (HCC) 03/28/2019   DM (diabetes mellitus), type 2, uncontrolled 2015   Has poor vision.  Family not aware of A1C or what sugars run.  Believe not well controlled.  Not able to monitor due to cost of monitoring equipment   Hypertension 2015    Current Outpatient Medications on File Prior to Visit  Medication Sig Dispense Refill   amLODipine  (NORVASC ) 10 MG tablet Take 1 tablet (10 mg total) by mouth every evening. 90 tablet 3   ASPIRIN  LOW DOSE 81 MG tablet TAKE 1 TABLET(81 MG) BY MOUTH DAILY 30 tablet 2   atorvastatin  (LIPITOR) 40 MG tablet Take 1 tablet (40 mg total) by  mouth daily. 90 tablet 1   azithromycin  (ZITHROMAX ) 500 MG tablet Take 1 tablet (500 mg total) by mouth daily. 2 tablet 0   Blood Glucose Monitoring Suppl (ACCU-CHEK GUIDE ME) w/Device KIT Use as instructed to check blood sugars 1 kit 0   Blood Glucose Monitoring Suppl (TRUE METRIX METER) w/Device KIT Use as directed 1 kit 0   Blood Pressure Monitoring (OMRON 3 SERIES BP MONITOR) DEVI Use to check blood pressure twice daily 1 each 0   carvedilol  (COREG ) 12.5 MG tablet Take 1 tablet (12.5 mg total) by mouth 2 (two) times daily. 180 tablet 3   dapagliflozin  propanediol (FARXIGA ) 10 MG TABS tablet TAKE 1 TABLET(10 MG) BY MOUTH DAILY BEFORE BREAKFAST 90 tablet 3   gabapentin  (NEURONTIN ) 300 MG capsule TAKE 1 CAPSULE(300 MG) BY MOUTH AT BEDTIME 30 capsule 5   glimepiride  (AMARYL ) 2 MG tablet Take 1 tablet (2 mg total) by mouth daily before breakfast. Hold if blood sugar is less than 150 90 tablet 1   glucose blood (ACCU-CHEK GUIDE) test strip 1 each by Other route daily. And lancets 1/day 100 each 3   glucose blood (TRUE METRIX BLOOD GLUCOSE TEST) test strip Use as instructed 100 each 12   glucose blood test strip Use as instructed 100 each 12   glucose blood test strip Use as instructed 100 each 12   hydrALAZINE  (APRESOLINE ) 25 MG tablet Take one tablet by mouth up to twice daily if systolic blood pressure is >160 60 tablet 2   isosorbide  mononitrate (IMDUR ) 60 MG 24 hr tablet Take 1 tablet (60 mg total) by mouth every morning. 90 tablet 3   TRUEplus Lancets 28G MISC Use as directed 100 each 4   No current facility-administered medications on file prior to visit.    No Known Allergies   Assessment/Plan:  1. Hypertension -   Assessment: BP in office BP 174/77 HR 80, and repeat 151/81 HR 75; repeat improved significantly but still above the goal (<140/90). Home blood pressure readings averaging 152/83 HR 81, above goal 140/90 Tolerates current regimen well without any side effects Denies  SOB, palpitation, chest pain, headaches,or swelling Will consider increasing carvedilol  for further BP control  Plan:  Start taking carvedilol  25 mg twice daily  Continue taking amlodipine  10mg  daily, isosorbide  60mg  daily and hydralazine  25mg  for systolic         BP >160 Encouraged patient to recheck blood pressure 1-2 hours after taking hydralazine  dose and record reading Patient to keep record of BP readings with heart rate and report to us  at the next visit Patient to see PharmD in 4-5 weeks for follow up   Patient to reach out to PharmD for any questions/concerns or if BP falls 100/60 or below / HR <60  Dorsey  E. Jaymes Hang, Pharm.D, CPP [facility] [address], [place], [place] [address] Phone: [phone]; Fax: [phone]

## 2024-09-21 NOTE — Patient Instructions (Addendum)
 Changes made by your pharmacist Davied Nocito E. Berdia Lachman, PharmD, CPP at today's visit:    Instructions/Changes  (what do you need to do) Your Notes  (what you did and when you did it)  Begin taking carvedilol  25 mg twice daily   2. Continue taking amlodipine  10mg  daily,        isosorbide  60mg  daily and hydralazine       25mg  for systolic BP >160   3. PharmD appointment in 4-5 weeks   4. Check blood pressure twice a day; if you have to take hydralazine  dose, recheck blood pressure in 1-2 hours to ensure blood pressure has improved    Bring all of your meds, your BP cuff and your record of home blood pressures to your next appointment.   Jennifer Molina E. Belton Peplinski, Pharm.D, CPP [facility]. [facility] [address], [place], [place] [address] Phone: [phone]; Fax: [phone]     HOW TO TAKE YOUR BLOOD PRESSURE AT HOME  Rest 5 minutes before taking your blood pressure.  Dont smoke or drink caffeinated beverages for at least 30 minutes before. Take your blood pressure before (not after) you eat. Sit comfortably with your back supported and both feet on the floor (dont cross your legs). Elevate your arm to heart level on a table or a desk. Use the proper sized cuff. It should fit smoothly and snugly around your bare upper arm. There should be enough room to slip a fingertip under the cuff. The bottom edge of the cuff should be 1 inch above the crease of the elbow. Ideally, take 3 measurements at one sitting and record the average.  Important lifestyle changes to control high blood pressure  Intervention  Effect on the BP  Lose extra pounds and watch your waistline Weight loss is one of the most effective lifestyle changes for controlling blood pressure. If you're overweight or obese, losing even a small amount of weight can help reduce blood pressure. Blood pressure might go down by about 1 millimeter of mercury (mm Hg) with each kilogram (about 2.2  pounds) of weight lost.  Exercise regularly As a general goal, aim for at least 30 minutes of moderate physical activity every day. Regular physical activity can lower high blood pressure by about 5 to 8 mm Hg.  Eat a healthy diet Eating a diet rich in whole grains, fruits, vegetables, and low-fat dairy products and low in saturated fat and cholesterol. A healthy diet can lower high blood pressure by up to 11 mm Hg.  Reduce salt (sodium) in your diet Even a small reduction of sodium in the diet can improve heart health and reduce high blood pressure by about 5 to 6 mm Hg.  Limit alcohol One drink equals 12 ounces of beer, 5 ounces of wine, or 1.5 ounces of 80-proof liquor.  Limiting alcohol to less than one drink a day for women or two drinks a day for men can help lower blood pressure by about 4 mm Hg.   If you have any questions or concerns please use My Chart to send questions or call the office at [phone]

## 2024-09-27 ENCOUNTER — Encounter (HOSPITAL_COMMUNITY)

## 2024-10-03 ENCOUNTER — Encounter (HOSPITAL_COMMUNITY)

## 2024-10-11 ENCOUNTER — Encounter (HOSPITAL_COMMUNITY)

## 2024-11-01 ENCOUNTER — Ambulatory Visit

## 2024-12-06 ENCOUNTER — Ambulatory Visit: Admitting: "Endocrinology
